# Patient Record
Sex: Male | Born: 1955
Health system: Southern US, Community
[De-identification: ages and names within clinical notes are randomized; demographics above are authoritative.]

## PROBLEM LIST (undated history)

## (undated) DIAGNOSIS — I10 Essential (primary) hypertension: Secondary | ICD-10-CM

## (undated) DIAGNOSIS — E119 Type 2 diabetes mellitus without complications: Secondary | ICD-10-CM

## (undated) HISTORY — DX: Essential (primary) hypertension: I10

## (undated) HISTORY — DX: Type 2 diabetes mellitus without complications: E11.9

## (undated) HISTORY — PX: TUMOR REMOVAL: SHX12

---

## 2013-02-26 DIAGNOSIS — E785 Hyperlipidemia, unspecified: Secondary | ICD-10-CM | POA: Insufficient documentation

## 2015-08-28 ENCOUNTER — Ambulatory Visit (INDEPENDENT_AMBULATORY_CARE_PROVIDER_SITE_OTHER): Payer: BLUE CROSS/BLUE SHIELD | Admitting: Family

## 2015-08-28 ENCOUNTER — Encounter: Payer: Self-pay | Admitting: Family

## 2015-08-28 ENCOUNTER — Other Ambulatory Visit (INDEPENDENT_AMBULATORY_CARE_PROVIDER_SITE_OTHER): Payer: BLUE CROSS/BLUE SHIELD

## 2015-08-28 VITALS — BP 134/82 | HR 90 | Temp 98.2°F | Resp 16 | Ht 63.0 in | Wt 144.0 lb

## 2015-08-28 DIAGNOSIS — Z125 Encounter for screening for malignant neoplasm of prostate: Secondary | ICD-10-CM | POA: Diagnosis not present

## 2015-08-28 DIAGNOSIS — Z0000 Encounter for general adult medical examination without abnormal findings: Secondary | ICD-10-CM

## 2015-08-28 DIAGNOSIS — I10 Essential (primary) hypertension: Secondary | ICD-10-CM | POA: Diagnosis not present

## 2015-08-28 DIAGNOSIS — E1140 Type 2 diabetes mellitus with diabetic neuropathy, unspecified: Secondary | ICD-10-CM

## 2015-08-28 DIAGNOSIS — Z1211 Encounter for screening for malignant neoplasm of colon: Secondary | ICD-10-CM

## 2015-08-28 DIAGNOSIS — E119 Type 2 diabetes mellitus without complications: Secondary | ICD-10-CM | POA: Insufficient documentation

## 2015-08-28 DIAGNOSIS — R7989 Other specified abnormal findings of blood chemistry: Secondary | ICD-10-CM | POA: Diagnosis not present

## 2015-08-28 LAB — MICROALBUMIN / CREATININE URINE RATIO
Creatinine,U: 84.5 mg/dL
MICROALB UR: 1.8 mg/dL (ref 0.0–1.9)
Microalb Creat Ratio: 2.1 mg/g (ref 0.0–30.0)

## 2015-08-28 LAB — COMPREHENSIVE METABOLIC PANEL
ALK PHOS: 79 U/L (ref 39–117)
ALT: 24 U/L (ref 0–53)
AST: 16 U/L (ref 0–37)
Albumin: 4.5 g/dL (ref 3.5–5.2)
BILIRUBIN TOTAL: 0.4 mg/dL (ref 0.2–1.2)
BUN: 22 mg/dL (ref 6–23)
CALCIUM: 9.8 mg/dL (ref 8.4–10.5)
CO2: 30 meq/L (ref 19–32)
Chloride: 98 mEq/L (ref 96–112)
Creatinine, Ser: 0.8 mg/dL (ref 0.40–1.50)
GFR: 104.66 mL/min (ref >60.00)
Glucose, Bld: 258 mg/dL — ABNORMAL HIGH (ref 70–99)
POTASSIUM: 4.6 meq/L (ref 3.5–5.1)
Sodium: 136 mEq/L (ref 135–145)
TOTAL PROTEIN: 7.6 g/dL (ref 6.0–8.3)

## 2015-08-28 LAB — HEMOGLOBIN A1C: Hgb A1c MFr Bld: 10.1 % — ABNORMAL HIGH (ref 4.6–6.5)

## 2015-08-28 LAB — CBC
HEMATOCRIT: 43 % (ref 39.0–52.0)
HEMOGLOBIN: 14.6 g/dL (ref 13.0–17.0)
MCHC: 33.8 g/dL (ref 30.0–36.0)
MCV: 88.7 fl (ref 78.0–100.0)
PLATELETS: 286 10*3/uL (ref 150.0–400.0)
RBC: 4.85 Mil/uL (ref 4.22–5.81)
RDW: 12.8 % (ref 11.5–15.5)
WBC: 8.9 10*3/uL (ref 4.0–10.5)

## 2015-08-28 LAB — PSA: PSA: 0.84 ng/mL (ref 0.10–4.00)

## 2015-08-28 LAB — LIPID PANEL
Cholesterol: 150 mg/dL (ref 0–200)
HDL: 40.4 mg/dL (ref >39.00)
NonHDL: 109.32
TRIGLYCERIDES: 230 mg/dL — AB (ref 0.0–149.0)
Total CHOL/HDL Ratio: 4
VLDL: 46 mg/dL — AB (ref 0.0–40.0)

## 2015-08-28 LAB — TSH: TSH: 0.51 u[IU]/mL (ref 0.35–4.50)

## 2015-08-28 LAB — HEPATITIS C ANTIBODY: HCV AB: NEGATIVE

## 2015-08-28 LAB — LDL CHOLESTEROL, DIRECT: LDL DIRECT: 51 mg/dL

## 2015-08-28 MED ORDER — METFORMIN HCL 1000 MG PO TABS: 1000.0000 mg | ORAL_TABLET | Freq: Two times a day (BID) | ORAL | Status: DC

## 2015-08-28 MED ORDER — LISINOPRIL 40 MG PO TABS: 40.0000 mg | ORAL_TABLET | Freq: Every day | ORAL | Status: DC

## 2015-08-28 NOTE — Progress Notes (Signed)
Subjective:    Patient ID: Edward [name], male    DOB: 1955/09/10, 60 y.o.   MRN: [id number]  Chief Complaint  Patient presents with  . Establish Care    CPE, had tea this morning but has not ate, needs A1c check, bydureon gives him a reaction    HPI:  Edward [name] is a 60 y.o. male who presents today for an annual wellness visit.   1) Health Maintenance -   Diet - Averages about 2 meals per day consisting of fruits, vegetables, chicken and beef, rice  Exercise - No structured exercise   2) Preventative Exams / Immunizations:  Dental -- Due for exam  Vision -- Due for exam   Health Maintenance  Topic Date Due  . HEMOGLOBIN A1C  Oct 09, 1955  . Hepatitis C Screening  Aug 05, 1955  . PNEUMOCOCCAL POLYSACCHARIDE VACCINE (1) 04/02/1957  . FOOT EXAM  04/02/1965  . OPHTHALMOLOGY EXAM  04/02/1965  . HIV Screening  04/02/1970  . COLONOSCOPY  04/02/2005  . ZOSTAVAX  04/03/2015  . INFLUENZA VACCINE  12/02/2015  . TETANUS/TDAP  10/08/2022    Immunization History  Administered Date(s) Administered  . Pneumococcal Polysaccharide-23 08/07/2012  . Tdap 08/07/2012     Review of Systems  Constitutional: Denies fever, chills, fatigue, or significant weight gain/loss. HENT: Head: Denies headache or neck pain Ears: Denies changes in hearing, ringing in ears, earache, drainage Nose: Denies discharge, stuffiness, itching, nosebleed, sinus pain Throat: Denies sore throat, hoarseness, dry mouth, sores, thrush Eyes: Denies loss/changes in vision, pain, redness, blurry/double vision, flashing lights Cardiovascular: Denies chest pain/discomfort, tightness, palpitations, shortness of breath with activity, difficulty lying down, swelling, sudden awakening with shortness of breath Respiratory: Denies shortness of breath, cough, sputum production, wheezing Gastrointestinal: Denies dysphasia, heartburn, change in appetite, nausea, change in bowel habits, rectal bleeding, constipation, diarrhea,  yellow skin or eyes Genitourinary: Denies frequency, urgency, burning/pain, blood in urine, incontinence, change in urinary strength. Musculoskeletal: Denies muscle/joint pain, stiffness, back pain, redness or swelling of joints, trauma Skin: Denies rashes, lumps, itching, dryness, color changes, or hair/nail changes Neurological: Denies dizziness, fainting, seizures, weakness, numbness, tingling, tremor Psychiatric - Denies nervousness, stress, depression or memory loss Endocrine: Denies heat or cold intolerance, sweating, frequent urination, excessive thirst, changes in appetite Hematologic: Denies ease of bruising or bleeding     Objective:     BP 134/82 mmHg  Pulse 90  Temp(Src) 98.2 F (36.8 C) (Oral)  Resp 16  Ht 5\' 3"  (1.6 m)  Wt 144 lb (65.318 kg)  BMI 25.51 kg/m2  SpO2 97% Nursing note and vital signs reviewed.  Physical Exam  Constitutional: He is oriented to person, place, and time. He appears well-developed and well-nourished.  HENT:  Head: Normocephalic.  Right Ear: Hearing, tympanic membrane, external ear and ear canal normal.  Left Ear: Hearing, tympanic membrane, external ear and ear canal normal.  Nose: Nose normal.  Mouth/Throat: Uvula is midline, oropharynx is clear and moist and mucous membranes are normal.  Eyes: Conjunctivae and EOM are normal. Pupils are equal, round, and reactive to light.  Neck: Neck supple. No JVD present. No tracheal deviation present. No thyromegaly present.  Cardiovascular: Normal rate, regular rhythm, normal heart sounds and intact distal pulses.   Pulmonary/Chest: Effort normal and breath sounds normal.  Abdominal: Soft. Bowel sounds are normal. He exhibits no distension and no mass. There is no tenderness. There is no rebound and no guarding.  Musculoskeletal: Normal range of motion. He exhibits no edema or  tenderness.  Lymphadenopathy:    He has no cervical adenopathy.  Neurological: He is alert and oriented to person, place,  and time. He has normal reflexes. No cranial nerve deficit. He exhibits normal muscle tone. Coordination normal.  Skin: Skin is warm and dry.  Psychiatric: He has a normal mood and affect. His behavior is normal. Judgment and thought content normal.       Assessment & Plan:   Problem List Items Addressed This Visit      Cardiovascular and Mediastinum   Essential hypertension    Hypertension is well controlled with current regimen and no adverse side effects. Continue current dosage of lisinopril.       Relevant Medications   lisinopril (PRINIVIL,ZESTRIL) 40 MG tablet     Endocrine   Type 2 diabetes mellitus ([place])    Type 2 diabetes currently managed on metformin and noted to be uncontrolled through previous lab work. Obtain A1c and urine microalbumin. Currently maintained on lisinopril for CAD risk reduction and hypertension. Diabetic foot exam completed today with some mild decreased sensation noted. Discontinued Bydureon secondary to skin site reaction. Sample of Trulicity given with instructions to check drug formulary for covered medications. Follow-up in 3 months or sooner pending A1c results.        Relevant Medications   lisinopril (PRINIVIL,ZESTRIL) 40 MG tablet   metFORMIN (GLUCOPHAGE) 1000 MG tablet   Other Relevant Orders   Hemoglobin A1c   Urine Microalbumin w/creat. ratio     Other   Routine general medical examination at a health care facility - Primary    1) Anticipatory Guidance: Discussed importance of wearing a seatbelt while driving and not texting while driving; changing batteries in smoke detector at least once annually; wearing suntan lotion when outside; eating a balanced and moderate diet; getting physical activity at least 30 minutes per day.  2) Immunizations / Screenings / Labs:  Declines Zostavax. All other immunizations are up-to-date per recommendations. Obtain PSA for prostate cancer screening. Obtain hepatitis C antibody for hepatitis C screening.  Obtain A1c for diabetes screening. Due for colonoscopy with referral to gastroenterology placed. Due for dental and eye exams encouraged to be completed independently. All other screenings are up-to-date per recommendations. Obtain CBC, CMET, Lipid profile and TSH.   Overall well exam with risk factors for cardiovascular disease including type 2 diabetes and hypertension. Current status of diabetes will be assessed with current A1c and currently maintained on metformin. Blood pressure appears well controlled with current regimen. Encouraged increased physical activity with recommended goal of 30 minutes of moderate level activity daily. This will also help with his blood sugar controls. Continue other healthy lifestyle choices and behaviors. Follow-up prevention exam in 1 year. Follow-up office visit for chronic conditions in 3 months.       Relevant Orders   Hepatitis C antibody   CBC   Comprehensive metabolic panel   Lipid panel   TSH   Hemoglobin A1c   Urine Microalbumin w/creat. ratio    Other Visit Diagnoses    Prostate cancer screening        Relevant Orders    PSA    Screening for colon cancer        Relevant Orders    Ambulatory referral to Gastroenterology

## 2015-08-28 NOTE — Assessment & Plan Note (Addendum)
1) Anticipatory Guidance: Discussed importance of wearing a seatbelt while driving and not texting while driving; changing batteries in smoke detector at least once annually; wearing suntan lotion when outside; eating a balanced and moderate diet; getting physical activity at least 30 minutes per day.  2) Immunizations / Screenings / Labs:  Declines Zostavax. All other immunizations are up-to-date per recommendations. Obtain PSA for prostate cancer screening. Obtain hepatitis C antibody for hepatitis C screening. Obtain A1c for diabetes screening. Due for colonoscopy with referral to gastroenterology placed. Due for dental and eye exams encouraged to be completed independently. All other screenings are up-to-date per recommendations. Obtain CBC, CMET, Lipid profile and TSH.   Overall well exam with risk factors for cardiovascular disease including type 2 diabetes and hypertension. Current status of diabetes will be assessed with current A1c and currently maintained on metformin. Blood pressure appears well controlled with current regimen. Encouraged increased physical activity with recommended goal of 30 minutes of moderate level activity daily. This will also help with his blood sugar controls. Continue other healthy lifestyle choices and behaviors. Follow-up prevention exam in 1 year. Follow-up office visit for chronic conditions in 3 months.

## 2015-08-28 NOTE — Assessment & Plan Note (Signed)
Hypertension is well controlled with current regimen and no adverse side effects. Continue current dosage of lisinopril.

## 2015-08-28 NOTE — Assessment & Plan Note (Signed)
Type 2 diabetes currently managed on metformin and noted to be uncontrolled through previous lab work. Obtain A1c and urine microalbumin. Currently maintained on lisinopril for CAD risk reduction and hypertension. Diabetic foot exam completed today with some mild decreased sensation noted. Discontinued Bydureon secondary to skin site reaction. Sample of Trulicity given with instructions to check drug formulary for covered medications. Follow-up in 3 months or sooner pending A1c results.

## 2015-08-28 NOTE — Patient Instructions (Signed)
Thank you for choosing [facility].  Summary/Instructions:  Please continue to take your medications as prescribed.  They will call to schedule your colonoscopy.  Please check your drug formulary for medications covered for diabetes.  Try the Trulicity 1x per week.   Your prescription(s) have been submitted to your pharmacy or been printed and provided for you. Please take as directed and contact our office if you believe you are having problem(s) with the medication(s) or have any questions.  Please stop by the lab on the basement level of the building for your blood work. Your results will be released to [place] (or called to you) after review, usually within 72 hours after test completion. If any changes need to be made, you will be notified at that same time.  Health Maintenance, Male A healthy lifestyle and preventative care can promote health and wellness.  Maintain regular health, dental, and eye exams.  Eat a healthy diet. Foods like vegetables, fruits, whole grains, low-fat dairy products, and lean protein foods contain the nutrients you need and are low in calories. Decrease your intake of foods high in solid fats, added sugars, and salt. Get information about a proper diet from your health care provider, if necessary.  Regular physical exercise is one of the most important things you can do for your health. Most adults should get at least 150 minutes of moderate-intensity exercise (any activity that increases your heart rate and causes you to sweat) each week. In addition, most adults need muscle-strengthening exercises on 2 or more days a week.   Maintain a healthy weight. The body mass index (BMI) is a screening tool to identify possible weight problems. It provides an estimate of body fat based on height and weight. Your health care provider can find your BMI and can help you achieve or maintain a healthy weight. For males 20 years and older:  A BMI below 18.5 is  considered underweight.  A BMI of 18.5 to 24.9 is normal.  A BMI of 25 to 29.9 is considered overweight.  A BMI of 30 and above is considered obese.  Maintain normal blood lipids and cholesterol by exercising and minimizing your intake of saturated fat. Eat a balanced diet with plenty of fruits and vegetables. Blood tests for lipids and cholesterol should begin at age 40 and be repeated every 5 years. If your lipid or cholesterol levels are high, you are over age 51, or you are at high risk for heart disease, you may need your cholesterol levels checked more frequently.Ongoing high lipid and cholesterol levels should be treated with medicines if diet and exercise are not working.  If you smoke, find out from your health care provider how to quit. If you do not use tobacco, do not start.  Lung cancer screening is recommended for adults aged 71-80 years who are at high risk for developing lung cancer because of a history of smoking. A yearly low-dose CT scan of the lungs is recommended for people who have at least a 30-pack-year history of smoking and are current smokers or have quit within the past 15 years. A pack year of smoking is smoking an average of 1 pack of cigarettes a day for 1 year (for example, a 30-pack-year history of smoking could mean smoking 1 pack a day for 30 years or 2 packs a day for 15 years). Yearly screening should continue until the smoker has stopped smoking for at least 15 years. Yearly screening should be stopped for people  who develop a health problem that would prevent them from having lung cancer treatment.  If you choose to drink alcohol, do not have more than 2 drinks per day. One drink is considered to be 12 oz (360 mL) of beer, 5 oz (150 mL) of wine, or 1.5 oz (45 mL) of liquor.  Avoid the use of street drugs. Do not share needles with anyone. Ask for help if you need support or instructions about stopping the use of drugs.  High blood pressure causes heart  disease and increases the risk of stroke. High blood pressure is more likely to develop in:  People who have blood pressure in the end of the normal range (100-139/85-89 mm Hg).  People who are overweight or obese.  People who are African American.  If you are 14-73 years of age, have your blood pressure checked every 3-5 years. If you are 60 years of age or older, have your blood pressure checked every year. You should have your blood pressure measured twice--once when you are at a hospital or clinic, and once when you are not at a hospital or clinic. Record the average of the two measurements. To check your blood pressure when you are not at a hospital or clinic, you can use:  An automated blood pressure machine at a pharmacy.  A home blood pressure monitor.  If you are 75-45 years old, ask your health care provider if you should take aspirin to prevent heart disease.  Diabetes screening involves taking a blood sample to check your fasting blood sugar level. This should be done once every 3 years after age 76 if you are at a normal weight and without risk factors for diabetes. Testing should be considered at a younger age or be carried out more frequently if you are overweight and have at least 1 risk factor for diabetes.  Colorectal cancer can be detected and often prevented. Most routine colorectal cancer screening begins at the age of 29 and continues through age 74. However, your health care provider may recommend screening at an earlier age if you have risk factors for colon cancer. On a yearly basis, your health care provider may provide home test kits to check for hidden blood in the stool. A small camera at the end of a tube may be used to directly examine the colon (sigmoidoscopy or colonoscopy) to detect the earliest forms of colorectal cancer. Talk to your health care provider about this at age 58 when routine screening begins. A direct exam of the colon should be repeated every 5-10  years through age 48, unless early forms of precancerous polyps or small growths are found.  People who are at an increased risk for hepatitis B should be screened for this virus. You are considered at high risk for hepatitis B if:  You were born in a country where hepatitis B occurs often. Talk with your health care provider about which countries are considered high risk.  Your parents were born in a high-risk country and you have not received a shot to protect against hepatitis B (hepatitis B vaccine).  You have HIV or AIDS.  You use needles to inject street drugs.  You live with, or have sex with, someone who has hepatitis B.  You are a man who has sex with other men (MSM).  You get hemodialysis treatment.  You take certain medicines for conditions like cancer, organ transplantation, and autoimmune conditions.  Hepatitis C blood testing is recommended for all people  born from [birth year] through 1965 and any individual with known risk factors for hepatitis C.  Healthy men should no longer receive prostate-specific antigen (PSA) blood tests as part of routine cancer screening. Talk to your health care provider about prostate cancer screening.  Testicular cancer screening is not recommended for adolescents or adult males who have no symptoms. Screening includes self-exam, a health care provider exam, and other screening tests. Consult with your health care provider about any symptoms you have or any concerns you have about testicular cancer.  Practice safe sex. Use condoms and avoid high-risk sexual practices to reduce the spread of sexually transmitted infections (STIs).  You should be screened for STIs, including gonorrhea and chlamydia if:  You are sexually active and are younger than 24 years.  You are older than 24 years, and your health care provider tells you that you are at risk for this type of infection.  Your sexual activity has changed since you were last screened, and you are  at an increased risk for chlamydia or gonorrhea. Ask your health care provider if you are at risk.  If you are at risk of being infected with HIV, it is recommended that you take a prescription medicine daily to prevent HIV infection. This is called pre-exposure prophylaxis (PrEP). You are considered at risk if:  You are a man who has sex with other men (MSM).  You are a heterosexual man who is sexually active with multiple partners.  You take drugs by injection.  You are sexually active with a partner who has HIV.  Talk with your health care provider about whether you are at high risk of being infected with HIV. If you choose to begin PrEP, you should first be tested for HIV. You should then be tested every 3 months for as long as you are taking PrEP.  Use sunscreen. Apply sunscreen liberally and repeatedly throughout the day. You should seek shade when your shadow is shorter than you. Protect yourself by wearing long sleeves, pants, a wide-brimmed hat, and sunglasses year round whenever you are outdoors.  Tell your health care provider of new moles or changes in moles, especially if there is a change in shape or color. Also, tell your health care provider if a mole is larger than the size of a pencil eraser.  A one-time screening for abdominal aortic aneurysm (AAA) and surgical repair of large AAAs by ultrasound is recommended for men aged 75-75 years who are current or former smokers.  Stay current with your vaccines (immunizations).   This information is not intended to replace advice given to you by your health care provider. Make sure you discuss any questions you have with your health care provider.   Document Released: 10/16/2007 Document Revised: 05/10/2014 Document Reviewed: 09/14/2010 Elsevier Interactive Patient Education [employer].

## 2015-08-31 ENCOUNTER — Telehealth: Payer: Self-pay | Admitting: Family

## 2015-08-31 DIAGNOSIS — E1140 Type 2 diabetes mellitus with diabetic neuropathy, unspecified: Secondary | ICD-10-CM

## 2015-08-31 MED ORDER — ROSUVASTATIN CALCIUM 10 MG PO TABS: 10.0000 mg | ORAL_TABLET | Freq: Every day | ORAL | Status: DC

## 2015-08-31 NOTE — Telephone Encounter (Signed)
Please inform patient that his blood work shows that his hepatitis C is negative. His kidney function, electrolytes, liver function, urine microalbumin, white/red blood cells, prostate and thyroid function are within the normal limits. His A1c as expected is elevated at 10.1 which means additional medications are needed. Please have him follow up with the drugs on his formulary so that we can work to get his blood sugar in control. As far as his cholesterol, it is recommend with his diabetes that we start him on a cholesterol medication. I will send the medication to his pharmacy.

## 2015-09-02 NOTE — Telephone Encounter (Signed)
Sending lab results in the due to language barrier.

## 2015-10-31 ENCOUNTER — Encounter: Payer: Self-pay | Admitting: Family

## 2015-11-11 ENCOUNTER — Telehealth: Payer: Self-pay | Admitting: *Deleted

## 2015-11-11 NOTE — Telephone Encounter (Signed)
Left msg on triage stating pt is trying to get an rx for Trulicity. Pt was given sample at last visit, but note doesn't say how mush he is suppose to take. Marya Amsler is not here pls advise on how much he need to take...Johny Chess

## 2015-11-12 MED ORDER — DULAGLUTIDE 0.75 MG/0.5ML ~~LOC~~ SOAJ: SUBCUTANEOUS | Status: DC

## 2015-11-12 NOTE — Telephone Encounter (Signed)
RX - done erx

## 2015-11-19 ENCOUNTER — Ambulatory Visit (INDEPENDENT_AMBULATORY_CARE_PROVIDER_SITE_OTHER): Payer: BLUE CROSS/BLUE SHIELD | Admitting: Family

## 2015-11-19 ENCOUNTER — Telehealth: Payer: Self-pay

## 2015-11-19 ENCOUNTER — Encounter: Payer: Self-pay | Admitting: Family

## 2015-11-19 VITALS — BP 118/82 | HR 76 | Temp 99.1°F | Resp 12 | Ht 65.0 in | Wt 144.0 lb

## 2015-11-19 DIAGNOSIS — E1140 Type 2 diabetes mellitus with diabetic neuropathy, unspecified: Secondary | ICD-10-CM

## 2015-11-19 DIAGNOSIS — I10 Essential (primary) hypertension: Secondary | ICD-10-CM | POA: Diagnosis not present

## 2015-11-19 LAB — POCT GLYCOSYLATED HEMOGLOBIN (HGB A1C): HEMOGLOBIN A1C: 9.9

## 2015-11-19 MED ORDER — GLUCOSE BLOOD VI STRP: ORAL_STRIP | Status: DC

## 2015-11-19 MED ORDER — DULAGLUTIDE 0.75 MG/0.5ML ~~LOC~~ SOAJ: SUBCUTANEOUS | Status: DC

## 2015-11-19 MED ORDER — DULAGLUTIDE 1.5 MG/0.5ML ~~LOC~~ SOAJ: 1.5000 mg | SUBCUTANEOUS | Status: DC

## 2015-11-19 MED ORDER — METFORMIN HCL 1000 MG PO TABS: 1000.0000 mg | ORAL_TABLET | Freq: Two times a day (BID) | ORAL | Status: DC

## 2015-11-19 MED ORDER — LISINOPRIL 40 MG PO TABS: 40.0000 mg | ORAL_TABLET | Freq: Every day | ORAL | Status: DC

## 2015-11-19 NOTE — Progress Notes (Signed)
Pre visit review using our clinic review tool, if applicable. No additional management support is needed unless otherwise documented below in the visit note. 

## 2015-11-19 NOTE — Patient Instructions (Addendum)
Thank you for choosing [facility].  Summary/Instructions:  Please continue to take your medication as prescribed.   Your prescription(s) have been submitted to your pharmacy or been printed and provided for you. Please take as directed and contact our office if you believe you are having problem(s) with the medication(s) or have any questions.  If your symptoms worsen or fail to improve, please contact our office for further instruction, or in case of emergency go directly to the emergency room at the closest medical facility.     

## 2015-11-19 NOTE — Telephone Encounter (Signed)
PA initiated and APPROVED through 05/02/2038 via [place]

## 2015-11-19 NOTE — Assessment & Plan Note (Signed)
Type 2 diabetes with improved A1c with continued elevation at 9.9. Increase Trulicity. Continue current dosage of metformin. Encouraged to follow low carbohydrate diet that is low in processed foods. Monitor blood sugars at home. Does have diabetic eye appointment scheduled. Maintained on lisinopril for CAD risk reduction. Follow up in 3 months.

## 2015-11-19 NOTE — Assessment & Plan Note (Signed)
Hypertension below goal of 140/90 with current regimen and no adverse side effects. No symptoms of end organ damage or worst headache of life. Continue current dosage of lisinopril. Monitor blood pressure at home and follow low sodium diet. Follow up in 3 months.

## 2015-11-19 NOTE — Progress Notes (Signed)
Subjective:    Patient ID: Edward [name], male    DOB: [birth year], 60 y.o.   MRN: [id number]  Chief Complaint  Patient presents with  . Follow-up    no new complaints    HPI:  Edward [name] is a 60 y.o. male who  has a past medical history of Hypertension and Diabetes mellitus without complication ([place]). and presents today for a follow up office visit.   1.) Type 2 diabetes - This is a chronic problem. Currently managed with Trulicity and metformin. Reports taking the medication as prescribed and denies adverse side effects or hypoglycemic readings. Has diabetic eye exam scheduled. No symptoms of end organ damage. Does not currently check his blood sugars at home as he has been out of test strips.    Lab Results  Component Value Date   HGBA1C 9.9 11/19/2015   2.) Hypertension - This is a chronic condition. Currently maintained on lisinopril. Reports taking medication as prescribed and denies adverse side effects or cough. Hypotensive readings. Denies worse headache of life. No new symptoms of end organ damage.  BP Readings from Last 3 Encounters:  11/19/15 118/82  08/28/15 134/82    No Known Allergies   Current Outpatient Prescriptions on File Prior to Visit  Medication Sig Dispense Refill  . Misc Natural Products (OSTEO BI-FLEX ADV DOUBLE ST PO) Take by mouth.    . Emollient (COLLAGEN EX) Apply topically. Reported on 11/19/2015     No current facility-administered medications on file prior to visit.    Review of Systems  Eyes:       Negative for changes in vision.  Respiratory: Negative for chest tightness and shortness of breath.   Cardiovascular: Negative for chest pain, palpitations and leg swelling.  Endocrine: Negative for polydipsia, polyphagia and polyuria.  Neurological: Negative for dizziness, weakness, light-headedness and headaches.      Objective:    BP 118/82 mmHg  Pulse 76  Temp(Src) 99.1 F (37.3 C) (Oral)  Resp 12  Ht 5\' 5"  (1.651 m)  Wt 144 lb  (65.318 kg)  BMI 23.96 kg/m2  SpO2 97% Nursing note and vital signs reviewed.  Physical Exam  Constitutional: He is oriented to person, place, and time. He appears well-developed and well-nourished. No distress.  Cardiovascular: Normal rate, regular rhythm, normal heart sounds and intact distal pulses.   Pulmonary/Chest: Effort normal and breath sounds normal.  Neurological: He is alert and oriented to person, place, and time.  Skin: Skin is warm and dry.  Psychiatric: He has a normal mood and affect. His behavior is normal. Judgment and thought content normal.       Assessment & Plan:   Problem List Items Addressed This Visit      Cardiovascular and Mediastinum   Essential hypertension    Hypertension below goal of 140/90 with current regimen and no adverse side effects. No symptoms of end organ damage or worst headache of life. Continue current dosage of lisinopril. Monitor blood pressure at home and follow low sodium diet. Follow up in 3 months.       Relevant Medications   lisinopril (PRINIVIL,ZESTRIL) 40 MG tablet     Endocrine   Type 2 diabetes mellitus ([place]) - Primary    Type 2 diabetes with improved A1c with continued elevation at 9.9. Increase Trulicity. Continue current dosage of metformin. Encouraged to follow low carbohydrate diet that is low in processed foods. Monitor blood sugars at home. Does have diabetic eye appointment scheduled. Maintained on  lisinopril for CAD risk reduction. Follow up in 3 months.       Relevant Medications   lisinopril (PRINIVIL,ZESTRIL) 40 MG tablet   metFORMIN (GLUCOPHAGE) 1000 MG tablet   Dulaglutide (TRULICITY) 1.5 0000000 SOPN   Other Relevant Orders   POCT HgB A1C (Completed)       I have discontinued Edward [name]'s rosuvastatin, Dulaglutide, and Dulaglutide. I am also having him start on glucose blood and Dulaglutide. Additionally, I am having him maintain his Misc Natural Products (OSTEO BI-FLEX ADV DOUBLE ST PO), Emollient  (COLLAGEN EX), lisinopril, and metFORMIN.   Meds ordered this encounter  Medications  . lisinopril (PRINIVIL,ZESTRIL) 40 MG tablet    Sig: Take 1 tablet (40 mg total) by mouth daily.    Dispense:  90 tablet    Refill:  3  . metFORMIN (GLUCOPHAGE) 1000 MG tablet    Sig: Take 1 tablet (1,000 mg total) by mouth 2 (two) times daily with a meal.    Dispense:  180 tablet    Refill:  3  . DISCONTD: Dulaglutide (TRULICITY) [id number] 0000000 SOPN    Sig: .5 cc weekly SQ    Dispense:  4 pen    Refill:  3  . glucose blood (ONE TOUCH ULTRA TEST) test strip    Sig: Use as instructed    Dispense:  100 each    Refill:  12    Substitution permissible per patient device and insurance coverage. Dx E11.9    Order Specific Question:  Supervising Provider    Answer:  Pricilla Holm A [id number]  . Dulaglutide (TRULICITY) 1.5 0000000 SOPN    Sig: Inject 1.5 mg into the skin once a week.    Dispense:  4 pen    Refill:  3    Please discontinue previous Trulicity order. Dx E11.9    Order Specific Question:  Supervising Provider    Answer:  Pricilla Holm A [id number]     Follow-up: Return in about 3 months (around 02/19/2016) for Diabetes.  Mauricio Po, FNP

## 2015-11-24 LAB — HM DIABETES EYE EXAM

## 2015-11-26 ENCOUNTER — Encounter: Payer: Self-pay | Admitting: Family

## 2016-02-18 ENCOUNTER — Encounter: Payer: Self-pay | Admitting: Family

## 2016-02-18 ENCOUNTER — Ambulatory Visit (INDEPENDENT_AMBULATORY_CARE_PROVIDER_SITE_OTHER): Payer: BLUE CROSS/BLUE SHIELD | Admitting: Family

## 2016-02-18 ENCOUNTER — Other Ambulatory Visit: Payer: Self-pay | Admitting: Family

## 2016-02-18 VITALS — BP 122/83 | HR 87 | Temp 98.1°F | Resp 14 | Ht 65.0 in | Wt 142.8 lb

## 2016-02-18 DIAGNOSIS — E1140 Type 2 diabetes mellitus with diabetic neuropathy, unspecified: Secondary | ICD-10-CM

## 2016-02-18 DIAGNOSIS — M25562 Pain in left knee: Secondary | ICD-10-CM

## 2016-02-18 LAB — POCT GLYCOSYLATED HEMOGLOBIN (HGB A1C): Hemoglobin A1C: 10.9

## 2016-02-18 MED ORDER — NAPROXEN-ESOMEPRAZOLE 500-20 MG PO TBEC: 1.0000 | DELAYED_RELEASE_TABLET | Freq: Two times a day (BID) | ORAL | 0 refills | Status: DC | PRN

## 2016-02-18 MED ORDER — GLIPIZIDE ER 5 MG PO TB24: 5.0000 mg | ORAL_TABLET | Freq: Every day | ORAL | 2 refills | Status: DC

## 2016-02-18 MED ORDER — DICLOFENAC SODIUM 2 % TD SOLN: 1.0000 "application " | Freq: Two times a day (BID) | TRANSDERMAL | 1 refills | Status: DC | PRN

## 2016-02-18 NOTE — Progress Notes (Signed)
Subjective:    Patient ID: Edward [name], male    DOB: [birth year], 60 y.o.   MRN: [id number]  Chief Complaint  Patient presents with  . Follow-up    diabetes, also pain in both knees x2 weeks    HPI:  Edward [name] is a 60 y.o. male who  has a past medical history of Diabetes mellitus without complication ([place]) and Hypertension. and presents today for a follow up office visit.   1.) Type 2 diabetes - Currently maintained on metformin and Trulicity. Reports taking the metformin on a regular basis and has not taken the Trulicity on a regular basis and denies adverse side effects. . Blood sugars at home are at best averaging around 260. Denies episodes of hypoglycemia or new symptoms of end organ damage.    Lab Results  Component Value Date   HGBA1C 10.9 02/18/2016    2.) Left Knee - This is a new problem. Associated symptom of pain located in his left knee has been going on for about 2 weeks. Denies any trauma or injury that he can recall. Initially notes it was difficult to walk on with some improvements. Pain was originally described as sharp but now is achy. Denies any sounds/sensations heard or felt.   No Known Allergies    Outpatient Medications Prior to Visit  Medication Sig Dispense Refill  . Emollient (COLLAGEN EX) Apply topically. Reported on 11/19/2015    . glucose blood (ONE TOUCH ULTRA TEST) test strip Use as instructed 100 each 12  . lisinopril (PRINIVIL,ZESTRIL) 40 MG tablet Take 1 tablet (40 mg total) by mouth daily. 90 tablet 3  . metFORMIN (GLUCOPHAGE) 1000 MG tablet Take 1 tablet (1,000 mg total) by mouth 2 (two) times daily with a meal. 180 tablet 3  . Misc Natural Products (OSTEO BI-FLEX ADV DOUBLE ST PO) Take by mouth.    . Dulaglutide (TRULICITY) 1.5 0000000 SOPN Inject 1.5 mg into the skin once a week. 4 pen 3   No facility-administered medications prior to visit.       Past Surgical History:  Procedure Laterality Date  . TUMOR REMOVAL        Past  Medical History:  Diagnosis Date  . Diabetes mellitus without complication ([name])   . Hypertension       Review of Systems  Eyes:       Negative for changes in vision.  Respiratory: Negative for chest tightness and shortness of breath.   Cardiovascular: Negative for chest pain, palpitations and leg swelling.  Endocrine: Negative for polydipsia, polyphagia and polyuria.  Neurological: Negative for dizziness, weakness, light-headedness and headaches.      Objective:    BP 122/83 (BP Location: Left Arm, Patient Position: Sitting, Cuff Size: Normal)   Pulse 87   Temp 98.1 F (36.7 C) (Oral)   Resp 14   Ht 5\' 5"  (1.651 m)   Wt 142 lb 12.8 oz (64.8 kg)   SpO2 98%   BMI 23.76 kg/m  Nursing note and vital signs reviewed.  Physical Exam  Constitutional: He is oriented to person, place, and time. He appears well-developed and well-nourished. No distress.  Cardiovascular: Normal rate, regular rhythm, normal heart sounds and intact distal pulses.   Pulmonary/Chest: Effort normal and breath sounds normal.  Musculoskeletal:  Left knee no obvious deformity, discoloration, or edema. Mild tenderness over medial femoral condyle/epicondyle. No pain elicited while in a seated position. There is discomfort noted with weightbearing. Range of motion within normal limits.  Strength is normal. Distal pulses and sensation are intact and appropriate. Ligamentous and meniscal testing are negative.   Neurological: He is alert and oriented to person, place, and time.  Skin: Skin is warm and dry.  Psychiatric: He has a normal mood and affect. His behavior is normal. Judgment and thought content normal.       Assessment & Plan:   Problem List Items Addressed This Visit      Endocrine   Type 2 diabetes mellitus ([name]) - Primary    Type 2 diabetes appears poorly controlled with A1c of 10.9 and less than compliant medication regimen with questionable patient understanding as a contributing factor.  Continue current dosage of metformin and Trulicity. Start Glipizide. Continue to monitor blood sugar at home. Follow-up in one month or sooner if blood sugars do not improve.      Relevant Medications   glipiZIDE (GLUCOTROL XL) 5 MG 24 hr tablet   Other Relevant Orders   POCT HgB A1C (Completed)     Other   Left knee pain    Left knee pain consistent with osteoarthritis/generalized inflammation. Treat conservatively with ice, home exercise therapy, and start Pennsaid and Vimovo. Compression sleeve as needed. Follow-up if symptoms worsen or do not improve.      Relevant Medications   Naproxen-Esomeprazole 500-20 MG TBEC   Diclofenac Sodium (PENNSAID) 2 % SOLN    Other Visit Diagnoses   None.      I am having Mr. [name] start on glipiZIDE, Naproxen-Esomeprazole, and Diclofenac Sodium. I am also having him maintain his Misc Natural Products (OSTEO BI-FLEX ADV DOUBLE ST PO), Emollient (COLLAGEN EX), lisinopril, metFORMIN, and glucose blood.   Meds ordered this encounter  Medications  . glipiZIDE (GLUCOTROL XL) 5 MG 24 hr tablet    Sig: Take 1 tablet (5 mg total) by mouth daily with breakfast.    Dispense:  30 tablet    Refill:  2    Order Specific Question:   Supervising Provider    Answer:   Pricilla Holm A [id number]  . Naproxen-Esomeprazole 500-20 MG TBEC    Sig: Take 1 tablet by mouth 2 (two) times daily as needed.    Dispense:  60 tablet    Refill:  0    Order Specific Question:   Supervising Provider    Answer:   Pricilla Holm A [id number]  . Diclofenac Sodium (PENNSAID) 2 % SOLN    Sig: Place 1 application onto the skin 2 (two) times daily as needed.    Dispense:  112 g    Refill:  1    Order Specific Question:   Supervising Provider    Answer:   Pricilla Holm A [id number]     Follow-up: Return in about 3 months (around 05/20/2016), or if symptoms worsen or fail to improve.  Mauricio Po, FNP

## 2016-02-18 NOTE — Assessment & Plan Note (Signed)
Left knee pain consistent with osteoarthritis/generalized inflammation. Treat conservatively with ice, home exercise therapy, and start Pennsaid and Vimovo. Compression sleeve as needed. Follow-up if symptoms worsen or do not improve.

## 2016-02-18 NOTE — Patient Instructions (Addendum)
Thank you for choosing [facility].  SUMMARY AND INSTRUCTIONS:  Ice x 20 minutes every 2 hours as needed and after activity.   Start Pennsaid 2x daily about 1/2 packet to the knee.   Medication:  Please continue to take the metformin twice daily and start taking the glipizide daily.   Continue to take the Trulicity weekly.  Monitor blood sugars 1-2 times per day.  Your prescription(s) have been submitted to your pharmacy or been printed and provided for you. Please take as directed and contact our office if you believe you are having problem(s) with the medication(s) or have any questions.  Follow up:  If your symptoms worsen or fail to improve, please contact our office for further instruction, or in case of emergency go directly to the emergency room at the closest medical facility.   DIABETES CARE INSTRUCTIONS:  Current A1c:  Lab Results  Component Value Date   HGBA1C 10.9 02/18/2016    A1C Goal: <7.0%  Fasting Blood Sugar Goal: 80-130 Blood sugar check that you take after fasting for at least 8 hours which is usually in the morning before eating.   Diabetes Prevention Screens:  1.) Ensure you have an annual eye exam by an ophthalmologist or optometrist.   2,) Ensure you have an annual foot exam or when any changes are noted including new onset numbness/tingling or wounds. Check your feet daily!  3.) The American Diabetes Association recommends the Pneumovax vaccination against pneumonia every 5 years.  4.) We will check your kidney function with a urine test on an annual basis.   Generic Knee Exercises EXERCISES RANGE OF MOTION (ROM) AND STRETCHING EXERCISES These exercises may help you when beginning to rehabilitate your injury. Your symptoms may resolve with or without further involvement from your physician, physical therapist, or athletic trainer. While completing these exercises, remember:   Restoring tissue flexibility helps normal motion to  return to the joints. This allows healthier, less painful movement and activity.  An effective stretch should be held for at least 30 seconds.  A stretch should never be painful. You should only feel a gentle lengthening or release in the stretched tissue. STRETCH - Knee Extension, Prone  Lie on your stomach on a firm surface, such as a bed or countertop. Place your right / left knee and leg just beyond the edge of the surface. You may wish to place a towel under the far end of your right / left thigh for comfort.  Relax your leg muscles and allow gravity to straighten your knee. Your clinician may advise you to add an ankle weight if more resistance is helpful for you.  You should feel a stretch in the back of your right / left knee. Hold this position for __________ seconds. Repeat __________ times. Complete this stretch __________ times per day. * Your physician, physical therapist, or athletic trainer may ask you to add ankle weight to enhance your stretch.  RANGE OF MOTION - Knee Flexion, Active  Lie on your back with both knees straight. (If this causes back discomfort, bend your opposite knee, placing your foot flat on the floor.)  Slowly slide your heel back toward your buttocks until you feel a gentle stretch in the front of your knee or thigh.  Hold for __________ seconds. Slowly slide your heel back to the starting position. Repeat __________ times. Complete this exercise __________ times per day.  STRETCH - Quadriceps, Prone   Lie on your stomach on a firm surface, such  as a bed or padded floor.  Bend your right / left knee and grasp your ankle. If you are unable to reach your ankle or pant leg, use a belt around your foot to lengthen your reach.  Gently pull your heel toward your buttocks. Your knee should not slide out to the side. You should feel a stretch in the front of your thigh and/or knee.  Hold this position for __________ seconds. Repeat __________ times.  Complete this stretch __________ times per day.  STRETCH - Hamstrings, Supine   Lie on your back. Loop a belt or towel over the ball of your right / left foot.  Straighten your right / left knee and slowly pull on the belt to raise your leg. Do not allow the right / left knee to bend. Keep your opposite leg flat on the floor.  Raise the leg until you feel a gentle stretch behind your right / left knee or thigh. Hold this position for __________ seconds. Repeat __________ times. Complete this stretch __________ times per day.  STRENGTHENING EXERCISES These exercises may help you when beginning to rehabilitate your injury. They may resolve your symptoms with or without further involvement from your physician, physical therapist, or athletic trainer. While completing these exercises, remember:   Muscles can gain both the endurance and the strength needed for everyday activities through controlled exercises.  Complete these exercises as instructed by your physician, physical therapist, or athletic trainer. Progress the resistance and repetitions only as guided.  You may experience muscle soreness or fatigue, but the pain or discomfort you are trying to eliminate should never worsen during these exercises. If this pain does worsen, stop and make certain you are following the directions exactly. If the pain is still present after adjustments, discontinue the exercise until you can discuss the trouble with your clinician. STRENGTH - Quadriceps, Isometrics  Lie on your back with your right / left leg extended and your opposite knee bent.  Gradually tense the muscles in the front of your right / left thigh. You should see either your knee cap slide up toward your hip or increased dimpling just above the knee. This motion will push the back of the knee down toward the floor/mat/bed on which you are lying.  Hold the muscle as tight as you can without increasing your pain for __________ seconds.  Relax  the muscles slowly and completely in between each repetition. Repeat __________ times. Complete this exercise __________ times per day.  STRENGTH - Quadriceps, Short Arcs   Lie on your back. Place a __________ inch towel roll under your knee so that the knee slightly bends.  Raise only your lower leg by tightening the muscles in the front of your thigh. Do not allow your thigh to rise.  Hold this position for __________ seconds. Repeat __________ times. Complete this exercise __________ times per day.  OPTIONAL ANKLE WEIGHTS: Begin with ____________________, but DO NOT exceed ____________________. Increase in 1 pound/0.5 kilogram increments.  STRENGTH - Quadriceps, Straight Leg Raises  Quality counts! Watch for signs that the quadriceps muscle is working to insure you are strengthening the correct muscles and not "cheating" by substituting with healthier muscles.  Lay on your back with your right / left leg extended and your opposite knee bent.  Tense the muscles in the front of your right / left thigh. You should see either your knee cap slide up or increased dimpling just above the knee. Your thigh may even quiver.  Tighten these  muscles even more and raise your leg 4 to 6 inches off the floor. Hold for __________ seconds.  Keeping these muscles tense, lower your leg.  Relax the muscles slowly and completely in between each repetition. Repeat __________ times. Complete this exercise __________ times per day.  STRENGTH - Hamstring, Curls  Lay on your stomach with your legs extended. (If you lay on a bed, your feet may hang over the edge.)  Tighten the muscles in the back of your thigh to bend your right / left knee up to 90 degrees. Keep your hips flat on the bed/floor.  Hold this position for __________ seconds.  Slowly lower your leg back to the starting position. Repeat __________ times. Complete this exercise __________ times per day.  OPTIONAL ANKLE WEIGHTS: Begin with  ____________________, but DO NOT exceed ____________________. Increase in 1 pound/0.5 kilogram increments.  STRENGTH - Quadriceps, Squats  Stand in a door frame so that your feet and knees are in line with the frame.  Use your hands for balance, not support, on the frame.  Slowly lower your weight, bending at the hips and knees. Keep your lower legs upright so that they are parallel with the door frame. Squat only within the range that does not increase your knee pain. Never let your hips drop below your knees.  Slowly return upright, pushing with your legs, not pulling with your hands. Repeat __________ times. Complete this exercise __________ times per day.  STRENGTH - Quadriceps, Wall Slides  Follow guidelines for form closely. Increased knee pain often results from poorly placed feet or knees.  Lean against a smooth wall or door and walk your feet out 18-24 inches. Place your feet hip-width apart.  Slowly slide down the wall or door until your knees bend __________ degrees.* Keep your knees over your heels, not your toes, and in line with your hips, not falling to either side.  Hold for __________ seconds. Stand up to rest for __________ seconds in between each repetition. Repeat __________ times. Complete this exercise __________ times per day. * Your physician, physical therapist, or athletic trainer will alter this angle based on your symptoms and progress.   This information is not intended to replace advice given to you by your health care provider. Make sure you discuss any questions you have with your health care provider.   Document Released: 03/03/2005 Document Revised: 05/10/2014 Document Reviewed: 08/01/2008 Elsevier Interactive Patient Education [employer].

## 2016-02-18 NOTE — Assessment & Plan Note (Signed)
Type 2 diabetes appears poorly controlled with A1c of 10.9 and less than compliant medication regimen with questionable patient understanding as a contributing factor. Continue current dosage of metformin and Trulicity. Start Glipizide. Continue to monitor blood sugar at home. Follow-up in one month or sooner if blood sugars do not improve.

## 2016-03-16 ENCOUNTER — Other Ambulatory Visit: Payer: Self-pay | Admitting: Family

## 2016-04-14 ENCOUNTER — Other Ambulatory Visit: Payer: Self-pay | Admitting: Family

## 2016-05-19 ENCOUNTER — Ambulatory Visit: Payer: BLUE CROSS/BLUE SHIELD | Admitting: Family

## 2016-05-25 ENCOUNTER — Other Ambulatory Visit: Payer: Self-pay | Admitting: Family

## 2016-05-25 ENCOUNTER — Encounter: Payer: Self-pay | Admitting: Family

## 2016-05-25 ENCOUNTER — Ambulatory Visit (INDEPENDENT_AMBULATORY_CARE_PROVIDER_SITE_OTHER): Payer: BLUE CROSS/BLUE SHIELD | Admitting: Family

## 2016-05-25 ENCOUNTER — Other Ambulatory Visit (INDEPENDENT_AMBULATORY_CARE_PROVIDER_SITE_OTHER): Payer: BLUE CROSS/BLUE SHIELD

## 2016-05-25 DIAGNOSIS — E1140 Type 2 diabetes mellitus with diabetic neuropathy, unspecified: Secondary | ICD-10-CM

## 2016-05-25 DIAGNOSIS — I10 Essential (primary) hypertension: Secondary | ICD-10-CM | POA: Diagnosis not present

## 2016-05-25 LAB — COMPREHENSIVE METABOLIC PANEL
ALT: 39 U/L (ref 0–53)
AST: 26 U/L (ref 0–37)
Albumin: 4.3 g/dL (ref 3.5–5.2)
Alkaline Phosphatase: 74 U/L (ref 39–117)
BILIRUBIN TOTAL: 0.4 mg/dL (ref 0.2–1.2)
BUN: 11 mg/dL (ref 6–23)
CHLORIDE: 101 meq/L (ref 96–112)
CO2: 30 meq/L (ref 19–32)
Calcium: 9.3 mg/dL (ref 8.4–10.5)
Creatinine, Ser: 0.65 mg/dL (ref 0.40–1.50)
GFR: 132.67 mL/min (ref >60.00)
GLUCOSE: 160 mg/dL — AB (ref 70–99)
Potassium: 3.7 mEq/L (ref 3.5–5.1)
Sodium: 138 mEq/L (ref 135–145)
Total Protein: 7.6 g/dL (ref 6.0–8.3)

## 2016-05-25 LAB — MICROALBUMIN / CREATININE URINE RATIO
CREATININE, U: 47 mg/dL
MICROALB/CREAT RATIO: 1.5 mg/g (ref 0.0–30.0)
Microalb, Ur: <0.7 mg/dL (ref 0.0–1.9)

## 2016-05-25 LAB — HEMOGLOBIN A1C: Hgb A1c MFr Bld: 9 % — ABNORMAL HIGH (ref 4.6–6.5)

## 2016-05-25 MED ORDER — LISINOPRIL 40 MG PO TABS: 40.0000 mg | ORAL_TABLET | Freq: Every day | ORAL | 3 refills | Status: DC

## 2016-05-25 MED ORDER — DULAGLUTIDE 1.5 MG/0.5ML ~~LOC~~ SOAJ: 1.5000 mg | SUBCUTANEOUS | 0 refills | Status: DC

## 2016-05-25 MED ORDER — DAPAGLIFLOZIN PRO-METFORMIN ER 5-1000 MG PO TB24: 1.0000 | ORAL_TABLET | Freq: Every day | ORAL | 2 refills | Status: DC

## 2016-05-25 MED ORDER — GLIPIZIDE ER 5 MG PO TB24: 5.0000 mg | ORAL_TABLET | Freq: Every day | ORAL | 2 refills | Status: DC

## 2016-05-25 NOTE — Progress Notes (Signed)
Subjective:    Patient ID: Edward [name], male    DOB: [birth year], 61 y.o.   MRN: [id number]  Chief Complaint  Patient presents with  . Follow-up    diabetes check, also is starting to experience some numbness in the tip of his fingers, happens right after waking up    HPI:  Edward [name] is a 61 y.o. male who  has a past medical history of Diabetes mellitus without complication ([place]) and Hypertension. and presents today for a follow up office visit.  1.) Type 2 diabetes - Currently mainted Trulicity, metformin and glipizide. Reports taking the medication as prescribed and denies adverse side effects of hypoglycemic readings. Blood sugars at home have been averaging around 200s. He has noted some numbness in the tips of his fingers and toes that occurs primarily in the morning. He is trying to decrease the carbohydrates in his diet. He has switched to brown rice from white rice.  Lab Results  Component Value Date   HGBA1C 10.9 02/18/2016     No Known Allergies    Outpatient Medications Prior to Visit  Medication Sig Dispense Refill  . Diclofenac Sodium (PENNSAID) 2 % SOLN Place 1 application onto the skin 2 (two) times daily as needed. 112 g 1  . Emollient (COLLAGEN EX) Apply topically. Reported on 11/19/2015    . glucose blood (ONE TOUCH ULTRA TEST) test strip Use as instructed 100 each 12  . metFORMIN (GLUCOPHAGE) 1000 MG tablet Take 1 tablet (1,000 mg total) by mouth 2 (two) times daily with a meal. 180 tablet 3  . Misc Natural Products (OSTEO BI-FLEX ADV DOUBLE ST PO) Take by mouth.    . Naproxen-Esomeprazole 500-20 MG TBEC Take 1 tablet by mouth 2 (two) times daily as needed. 60 tablet 0  . glipiZIDE (GLUCOTROL XL) 5 MG 24 hr tablet Take 1 tablet (5 mg total) by mouth daily with breakfast. 30 tablet 2  . lisinopril (PRINIVIL,ZESTRIL) 40 MG tablet Take 1 tablet (40 mg total) by mouth daily. 90 tablet 3  . TRULICITY 1.5 0000000 SOPN INJECT 1.5 MG INTO THE SKIN ONCE A WEEK 2 mL 0    No facility-administered medications prior to visit.      Review of Systems  Constitutional: Negative for chills and fever.  Eyes:       Negative for changes in vision.  Respiratory: Negative for cough, chest tightness, shortness of breath and wheezing.   Cardiovascular: Negative for chest pain, palpitations and leg swelling.  Endocrine: Negative for polydipsia, polyphagia and polyuria.  Neurological: Negative for dizziness, weakness, light-headedness and headaches.      Objective:    BP 132/84 (BP Location: Left Arm, Patient Position: Sitting, Cuff Size: Normal)   Pulse 84   Temp 99.1 F (37.3 C) (Oral)   Resp 16   Ht 5\' 5"  (1.651 m)   Wt 149 lb (67.6 kg)   SpO2 96%   BMI 24.79 kg/m  Nursing note and vital signs reviewed.  Physical Exam  Constitutional: He is oriented to person, place, and time. He appears well-developed and well-nourished. No distress.  Cardiovascular: Normal rate, regular rhythm, normal heart sounds and intact distal pulses.   Pulmonary/Chest: Effort normal and breath sounds normal.  Neurological: He is alert and oriented to person, place, and time.  Skin: Skin is warm and dry.  Psychiatric: He has a normal mood and affect. His behavior is normal. Judgment and thought content normal.       Assessment &  Plan:   Problem List Items Addressed This Visit      Cardiovascular and Mediastinum   Essential hypertension    Blood pressure appears adequate control and below goal 140/90 with current regimen and no adverse side effects. Denies worst headache of life with no symptoms of end organ damage from blood pressure noted on physical exam. Continue current dosage of lisinopril. Monitor blood pressure at home and follow sodium diet. Continue to monitor.      Relevant Medications   lisinopril (PRINIVIL,ZESTRIL) 40 MG tablet   Other Relevant Orders   Comprehensive metabolic panel     Endocrine   Type 2 diabetes mellitus (HCC)    Type 2 diabetes  appears uncontrolled with current medication regimen with home blood sugars of 210. No hypoglycemic readings. Obtain A1c, urine microalbumin, and complete metabolic profile. Does have some new onset numbness and tingling most likely related to elevated blood sugars. Continue current dosage of Trulicity, glipizide, and metformin. Consider additional medications pending A1c results. Diabetic foot exam, I exam, and Pneumovax are up-to-date per recommendations.      Relevant Medications   Dulaglutide (TRULICITY) 1.5 0000000 SOPN   glipiZIDE (GLUCOTROL XL) 5 MG 24 hr tablet   lisinopril (PRINIVIL,ZESTRIL) 40 MG tablet   Other Relevant Orders   Hemoglobin A1c   Urine Microalbumin w/creat. ratio   Comprehensive metabolic panel       I have changed Mr. Solem's TRULICITY to Dulaglutide. I am also having him maintain his Misc Natural Products (OSTEO BI-FLEX ADV DOUBLE ST PO), Emollient (COLLAGEN EX), metFORMIN, glucose blood, Naproxen-Esomeprazole, Diclofenac Sodium, glipiZIDE, and lisinopril.   Meds ordered this encounter  Medications  . Dulaglutide (TRULICITY) 1.5 0000000 SOPN    Sig: Inject 1.5 mg into the skin once a week.    Dispense:  2 mL    Refill:  0    Order Specific Question:   Supervising Provider    Answer:   Pricilla Holm A [id number]  . glipiZIDE (GLUCOTROL XL) 5 MG 24 hr tablet    Sig: Take 1 tablet (5 mg total) by mouth daily with breakfast.    Dispense:  30 tablet    Refill:  2    Order Specific Question:   Supervising Provider    Answer:   Pricilla Holm A [id number]  . lisinopril (PRINIVIL,ZESTRIL) 40 MG tablet    Sig: Take 1 tablet (40 mg total) by mouth daily.    Dispense:  90 tablet    Refill:  3    Order Specific Question:   Supervising Provider    Answer:   Pricilla Holm A [id number]     Follow-up: Return if symptoms worsen or fail to improve.  Mauricio Po, FNP

## 2016-05-25 NOTE — Assessment & Plan Note (Signed)
Type 2 diabetes appears uncontrolled with current medication regimen with home blood sugars of 210. No hypoglycemic readings. Obtain A1c, urine microalbumin, and complete metabolic profile. Does have some new onset numbness and tingling most likely related to elevated blood sugars. Continue current dosage of Trulicity, glipizide, and metformin. Consider additional medications pending A1c results. Diabetic foot exam, I exam, and Pneumovax are up-to-date per recommendations.

## 2016-05-25 NOTE — Patient Instructions (Signed)
Thank you for choosing [facility].  SUMMARY AND INSTRUCTIONS:  Medication:  Please continue to take her medications as prescribed.  Pending your A1c results we'll make changes as needed.  Please check your blood sugars daily.   Your prescription(s) have been submitted to your pharmacy or been printed and provided for you. Please take as directed and contact our office if you believe you are having problem(s) with the medication(s) or have any questions.  Labs:  Please stop by the lab on the lower level of the building for your blood work. Your results will be released to [place] (or called to you) after review, usually within 72 hours after test completion. If any changes need to be made, you will be notified at that same time.  1.) The lab is open from 7:30am to 5:30 pm Monday-Friday 2.) No appointment is necessary 3.) Fasting (if needed) is 6-8 hours after food and drink; black coffee and water are okay   Follow up:  If your symptoms worsen or fail to improve, please contact our office for further instruction, or in case of emergency go directly to the emergency room at the closest medical facility.   DIABETES CARE INSTRUCTIONS:  Current A1c:  Lab Results  Component Value Date   HGBA1C 10.9 02/18/2016    A1C Goal: <7.0%  Fasting Blood Sugar Goal: 80-130 Blood sugar check that you take after fasting for at least 8 hours which is usually in the morning before eating.  Post-prandial Blood Sugar Goal: <180 Blood sugar check approximately 1-2 hours after the start of a meal.   Diabetes Prevention Screens:  1.) Ensure you have an annual eye exam by an ophthalmologist or optometrist.   2,) Ensure you have an annual foot exam or when any changes are noted including new onset numbness/tingling or wounds. Check your feet daily!  3.) The American Diabetes Association recommends the Pneumovax vaccination against pneumonia every 5 years.  4.) We will check your kidney  function with a urine test on an annual basis.

## 2016-05-25 NOTE — Assessment & Plan Note (Signed)
Blood pressure appears adequate control and below goal 140/90 with current regimen and no adverse side effects. Denies worst headache of life with no symptoms of end organ damage from blood pressure noted on physical exam. Continue current dosage of lisinopril. Monitor blood pressure at home and follow sodium diet. Continue to monitor.

## 2016-05-31 ENCOUNTER — Telehealth: Payer: Self-pay

## 2016-05-31 NOTE — Telephone Encounter (Signed)
PA initiated for xigduo. Waiting response

## 2016-06-17 ENCOUNTER — Telehealth: Payer: Self-pay

## 2016-06-17 NOTE — Telephone Encounter (Signed)
PA done for xigduo on covermymeds.  Key is [id number]

## 2016-06-18 ENCOUNTER — Other Ambulatory Visit: Payer: Self-pay | Admitting: Family

## 2016-06-18 DIAGNOSIS — M25562 Pain in left knee: Secondary | ICD-10-CM

## 2016-06-21 ENCOUNTER — Encounter: Payer: Self-pay | Admitting: Family

## 2016-06-23 ENCOUNTER — Telehealth: Payer: Self-pay

## 2016-06-23 MED ORDER — CANAGLIFLOZIN-METFORMIN HCL ER 50-1000 MG PO TB24: 1.0000 | ORAL_TABLET | Freq: Every day | ORAL | 2 refills | Status: DC

## 2016-06-23 NOTE — Telephone Encounter (Signed)
PA for Merleen Nicely is denied. Alternatives are Invokamet and Synjardy. Please advise

## 2016-06-23 NOTE — Telephone Encounter (Signed)
Xigduo discontinued and Invokamet sent to pharmacy.

## 2016-06-23 NOTE — Telephone Encounter (Signed)
Informed pt through mychart.

## 2016-06-23 NOTE — Addendum Note (Signed)
Addended by: Mauricio Po D on: 06/23/2016 03:29 PM   Modules accepted: Orders

## 2016-07-02 ENCOUNTER — Other Ambulatory Visit: Payer: Self-pay | Admitting: Family

## 2016-08-17 ENCOUNTER — Other Ambulatory Visit: Payer: Self-pay | Admitting: Family

## 2016-08-17 DIAGNOSIS — M25562 Pain in left knee: Secondary | ICD-10-CM

## 2016-08-24 ENCOUNTER — Ambulatory Visit (INDEPENDENT_AMBULATORY_CARE_PROVIDER_SITE_OTHER): Payer: BLUE CROSS/BLUE SHIELD | Admitting: Family

## 2016-08-24 VITALS — BP 118/70 | HR 95 | Temp 98.3°F | Ht 65.0 in | Wt 142.0 lb

## 2016-08-24 DIAGNOSIS — M25562 Pain in left knee: Secondary | ICD-10-CM | POA: Diagnosis not present

## 2016-08-24 DIAGNOSIS — E1140 Type 2 diabetes mellitus with diabetic neuropathy, unspecified: Secondary | ICD-10-CM

## 2016-08-24 LAB — POCT GLYCOSYLATED HEMOGLOBIN (HGB A1C): HEMOGLOBIN A1C: 8.7

## 2016-08-24 MED ORDER — DULAGLUTIDE 1.5 MG/0.5ML ~~LOC~~ SOAJ: 1.5000 mg | SUBCUTANEOUS | 2 refills | Status: DC

## 2016-08-24 MED ORDER — CANAGLIFLOZIN-METFORMIN HCL ER 150-1000 MG PO TB24: 1.0000 | ORAL_TABLET | Freq: Every day | ORAL | 2 refills | Status: DC

## 2016-08-24 MED ORDER — NAPROXEN-ESOMEPRAZOLE 500-20 MG PO TBEC: 1.0000 | DELAYED_RELEASE_TABLET | Freq: Two times a day (BID) | ORAL | 2 refills | Status: DC | PRN

## 2016-08-24 MED ORDER — GLIPIZIDE ER 5 MG PO TB24: 5.0000 mg | ORAL_TABLET | Freq: Every day | ORAL | 2 refills | Status: DC

## 2016-08-24 NOTE — Patient Instructions (Signed)
Thank you for choosing [facility].  SUMMARY AND INSTRUCTIONS:  We have increased your Invokamet to [phone].   Please continue to take other medications as prescribed.  Continue to work on decreasing carbohydrates.   Follow up in 3 months.   Medication:  Your prescription(s) have been submitted to your pharmacy or been printed and provided for you. Please take as directed and contact our office if you believe you are having problem(s) with the medication(s) or have any questions.   Follow up:  If your symptoms worsen or fail to improve, please contact our office for further instruction, or in case of emergency go directly to the emergency room at the closest medical facility.

## 2016-08-24 NOTE — Assessment & Plan Note (Signed)
Type 2 diabetes remains uncontrolled with an office A1c of 8.7 which is improved from 9.0. Increase Invokamet. Continue current dosage of glipizide and Trulicity. Monitor blood sugars at home. Continue to work on low carb/modified carb nutritional intake. Diabetic foot exam completed today. Urine micro is up to date. Continue to monitor.

## 2016-08-24 NOTE — Progress Notes (Signed)
Pre visit review using our clinic review tool, if applicable. No additional management support is needed unless otherwise documented below in the visit note. 

## 2016-08-24 NOTE — Progress Notes (Signed)
Subjective:    Patient ID: Edward [name], male    DOB: 19-Sep-1955, 61 y.o.   MRN: [id number]  Chief Complaint  Patient presents with  . Follow-up    diabetes     HPI:  Edward [name] is a 61 y.o. male who  has a past medical history of Diabetes mellitus without complication ([place]) and Hypertension. and presents today for a follow up office visit.  Reports taking the medication as prescribed and denies adverse side effects or hypoglycemic readings. Blood sugars at home have been averaging around 210 in the morning. . Denies new symptoms of end organ damage. Does endorse some increased thirst. Working on a low/carbohydrate modified oral intake.   Lab Results  Component Value Date   HGBA1C 8.7 08/24/2016     Lab Results  Component Value Date   CREATININE 0.65 05/25/2016   BUN 11 05/25/2016   NA 138 05/25/2016   K 3.7 05/25/2016   CL 101 05/25/2016   CO2 30 05/25/2016       No Known Allergies    Outpatient Medications Prior to Visit  Medication Sig Dispense Refill  . Emollient (COLLAGEN EX) Apply topically. Reported on 11/19/2015    . glucose blood (ONE TOUCH ULTRA TEST) test strip Use as instructed 100 each 12  . lisinopril (PRINIVIL,ZESTRIL) 40 MG tablet Take 1 tablet (40 mg total) by mouth daily. 90 tablet 3  . Misc Natural Products (OSTEO BI-FLEX ADV DOUBLE ST PO) Take by mouth.    Marland Kitchen PENNSAID 2 % SOLN Place 1 application onto the skin 2 (two) times daily as needed. 112 g 0  . Canagliflozin-Metformin HCl ER (INVOKAMET XR) 50-1000 MG TB24 Take 1 tablet by mouth daily. 30 tablet 2  . glipiZIDE (GLUCOTROL XL) 5 MG 24 hr tablet Take 1 tablet (5 mg total) by mouth daily with breakfast. 30 tablet 2  . TRULICITY 1.5 VQ/0.0QQ SOPN INJECT 1.5 MG INTO THE SKIN ONCE A WEEK 2 mL 0  . VIMOVO 500-20 MG TBEC Take 1 tablet by mouth 2 (two) times daily as needed. 60 tablet 0   No facility-administered medications prior to visit.      Review of Systems  Eyes:       Negative for changes  in vision.  Respiratory: Negative for chest tightness and shortness of breath.   Cardiovascular: Negative for chest pain, palpitations and leg swelling.  Endocrine: Negative for polydipsia, polyphagia and polyuria.  Neurological: Negative for dizziness, weakness, light-headedness and headaches.      Objective:    BP 118/70 (BP Location: Left Arm, Patient Position: Sitting, Cuff Size: Normal)   Pulse 95   Temp 98.3 F (36.8 C) (Oral)   Ht 5\' 5"  (1.651 m)   Wt 142 lb (64.4 kg)   SpO2 98%   BMI 23.63 kg/m  Nursing note and vital signs reviewed.  Physical Exam  Constitutional: He is oriented to person, place, and time. He appears well-developed and well-nourished. No distress.  Cardiovascular: Normal rate, regular rhythm, normal heart sounds and intact distal pulses.   Pulmonary/Chest: Effort normal and breath sounds normal.  Neurological: He is alert and oriented to person, place, and time.  Diabetic Foot Exam - Simple   Simple Foot Form Diabetic Foot exam was performed with the following findings:  Yes  08/24/2016 12:47 PM  Visual Inspection No deformities, no ulcerations, no other skin breakdown bilaterally:  Yes Sensation Testing Intact to touch and monofilament testing bilaterally:  Yes Pulse Check Posterior  Tibialis and Dorsalis pulse intact bilaterally:  Yes Comments  Skin: Skin is warm and dry.  Psychiatric: He has a normal mood and affect. His behavior is normal. Judgment and thought content normal.       Assessment & Plan:   Problem List Items Addressed This Visit      Endocrine   Type 2 diabetes mellitus ([place]) - Primary    Type 2 diabetes remains uncontrolled with an office A1c of 8.7 which is improved from 9.0. Increase Invokamet. Continue current dosage of glipizide and Trulicity. Monitor blood sugars at home. Continue to work on low carb/modified carb nutritional intake. Diabetic foot exam completed today. Urine micro is up to date. Continue to monitor.         Relevant Medications   Dulaglutide (TRULICITY) 1.5 GY/1.7CB SOPN   glipiZIDE (GLUCOTROL XL) 5 MG 24 hr tablet   Canagliflozin-Metformin HCl ER (INVOKAMET XR) [phone] MG TB24   Other Relevant Orders   POCT HgB A1C (Completed)     Other   Left knee pain   Relevant Medications   Naproxen-Esomeprazole (VIMOVO) 500-20 MG TBEC       I have discontinued Mr. Vanhoose's Canagliflozin-Metformin HCl ER. I have also changed his TRULICITY to Dulaglutide and VIMOVO to Naproxen-Esomeprazole. Additionally, I am having him start on Canagliflozin-Metformin HCl ER. Lastly, I am having him maintain his Misc Natural Products (OSTEO BI-FLEX ADV DOUBLE ST PO), Emollient (COLLAGEN EX), glucose blood, lisinopril, PENNSAID, and glipiZIDE.   Meds ordered this encounter  Medications  . Dulaglutide (TRULICITY) 1.5 SW/9.6PR SOPN    Sig: Inject 1.5 mg into the skin once a week.    Dispense:  2 mL    Refill:  2    Order Specific Question:   Supervising Provider    Answer:   Pricilla Holm A [[id number]]  . Naproxen-Esomeprazole (VIMOVO) 500-20 MG TBEC    Sig: Take 1 tablet by mouth 2 (two) times daily as needed.    Dispense:  60 tablet    Refill:  2    Order Specific Question:   Supervising Provider    Answer:   Pricilla Holm A [[id number]]  . glipiZIDE (GLUCOTROL XL) 5 MG 24 hr tablet    Sig: Take 1 tablet (5 mg total) by mouth daily with breakfast.    Dispense:  30 tablet    Refill:  2    Order Specific Question:   Supervising Provider    Answer:   Pricilla Holm A [[id number]]  . Canagliflozin-Metformin HCl ER (INVOKAMET XR) [phone] MG TB24    Sig: Take 1 tablet by mouth daily.    Dispense:  30 tablet    Refill:  2    Order Specific Question:   Supervising Provider    Answer:   Pricilla Holm A [[id number]]     Follow-up: Return in about 3 months (around 11/23/2016).  Mauricio Po, FNP

## 2016-10-27 ENCOUNTER — Other Ambulatory Visit: Payer: Self-pay | Admitting: Family

## 2016-11-09 ENCOUNTER — Other Ambulatory Visit: Payer: Self-pay | Admitting: Family

## 2016-11-09 DIAGNOSIS — E1140 Type 2 diabetes mellitus with diabetic neuropathy, unspecified: Secondary | ICD-10-CM

## 2016-11-17 ENCOUNTER — Other Ambulatory Visit: Payer: Self-pay | Admitting: Family

## 2016-11-22 ENCOUNTER — Encounter: Payer: Self-pay | Admitting: Family

## 2016-11-22 ENCOUNTER — Ambulatory Visit (INDEPENDENT_AMBULATORY_CARE_PROVIDER_SITE_OTHER): Payer: BLUE CROSS/BLUE SHIELD | Admitting: Family

## 2016-11-22 VITALS — BP 124/72 | HR 89 | Temp 98.3°F | Resp 16 | Ht 65.0 in | Wt 143.8 lb

## 2016-11-22 DIAGNOSIS — M25562 Pain in left knee: Secondary | ICD-10-CM | POA: Diagnosis not present

## 2016-11-22 DIAGNOSIS — E1140 Type 2 diabetes mellitus with diabetic neuropathy, unspecified: Secondary | ICD-10-CM

## 2016-11-22 LAB — POCT GLYCOSYLATED HEMOGLOBIN (HGB A1C): Hemoglobin A1C: 6.3

## 2016-11-22 MED ORDER — NAPROXEN-ESOMEPRAZOLE 500-20 MG PO TBEC: 1.0000 | DELAYED_RELEASE_TABLET | Freq: Two times a day (BID) | ORAL | 2 refills | Status: DC | PRN

## 2016-11-22 MED ORDER — DULAGLUTIDE 1.5 MG/0.5ML ~~LOC~~ SOAJ: 1.5000 mg | SUBCUTANEOUS | 5 refills | Status: DC

## 2016-11-22 MED ORDER — GLIPIZIDE ER 5 MG PO TB24: 5.0000 mg | ORAL_TABLET | Freq: Every day | ORAL | 1 refills | Status: DC

## 2016-11-22 NOTE — Assessment & Plan Note (Signed)
In office A1c of 6.3 significantly improved from 8.2 with change of medication. No adverse side effects. Continue current dosage of Trulicity, Invokmet, and glipizide. Diabetic prevention up to date and maintained on lisinopril for CAD risk reduction. Encouraged to monitor blood sugars at home once daily. Follow up in 6 months or sooner if needed.

## 2016-11-22 NOTE — Patient Instructions (Addendum)
Thank you for choosing [facility].  SUMMARY AND INSTRUCTIONS:  Your A1c is EXCELLET at 6.3%!  Please continue to take your medications as prescribed.  Schedule a time for your diabetic eye exam at your convenience if not already completed.   Monitor your blood sugars at home 1-2 times per day.   Follow up in 6 months or sooner if needed.   Medication:  Your prescription(s) have been submitted to your pharmacy or been printed and provided for you. Please take as directed and contact our office if you believe you are having problem(s) with the medication(s) or have any questions.  Follow up:  If your symptoms worsen or fail to improve, please contact our office for further instruction, or in case of emergency go directly to the emergency room at the closest medical facility.

## 2016-11-22 NOTE — Progress Notes (Signed)
Subjective:    Patient ID: Edward [name], male    DOB: 08-21-1955, 61 y.o.   MRN: [id number]  Chief Complaint  Patient presents with  . Follow-up    diabetes    HPI:  Edward [name] is a 61 y.o. male who  has a past medical history of Diabetes mellitus without complication ([place]) and Hypertension. and presents today for a follow up office visit.    1.) Type 2 diabetes - Currently maintained on Trulicity, glipizide and Invokamet.  Reports taking the medication as prescribed and denies adverse side effects or hypoglycemic readings. Not currently check blood sugars at home. Denies new symptoms of end organ damage. No excessive hunger, thirst, or urination. Working on a low/carbohydrate modified oral intake.   Lab Results  Component Value Date   HGBA1C 6.3 11/22/2016     Lab Results  Component Value Date   CREATININE 0.65 05/25/2016   BUN 11 05/25/2016   NA 138 05/25/2016   K 3.7 05/25/2016   CL 101 05/25/2016   CO2 30 05/25/2016    No Known Allergies    Outpatient Medications Prior to Visit  Medication Sig Dispense Refill  . Emollient (COLLAGEN EX) Apply topically. Reported on 11/19/2015    . glucose blood (ONE TOUCH ULTRA TEST) test strip Use as instructed 100 each 12  . INVOKAMET XR [phone] MG TB24 TAKE 1 TABLET BY MOUTH DAILY 30 tablet 0  . lisinopril (PRINIVIL,ZESTRIL) 40 MG tablet Take 1 tablet (40 mg total) by mouth daily. 90 tablet 3  . Misc Natural Products (OSTEO BI-FLEX ADV DOUBLE ST [name]) Take by mouth.    Marland Kitchen PENNSAID 2 % SOLN Place 1 application onto the skin 2 (two) times daily as needed. 112 g 0  . glipiZIDE (GLUCOTROL XL) 5 MG 24 hr tablet Take 1 tablet (5 mg total) by mouth daily with breakfast. Annual appt is due must see MD for future refills 30 tablet 0  . Naproxen-Esomeprazole (VIMOVO) 500-20 MG TBEC Take 1 tablet by mouth 2 (two) times daily as needed. 60 tablet 2  . TRULICITY 1.5 LE/7.5TZ SOPN INJECT 1.5 MG INTO THE SKIN ONCE A WEEK 2 mL 2   No  facility-administered medications prior to visit.      Review of Systems  Eyes:       Negative for changes in vision.  Respiratory: Negative for chest tightness and shortness of breath.   Cardiovascular: Negative for chest pain, palpitations and leg swelling.  Endocrine: Negative for polydipsia, polyphagia and polyuria.  Neurological: Negative for dizziness, weakness, light-headedness and headaches.      Objective:    BP 124/72 (BP Location: Left Arm, Patient Position: Sitting, Cuff Size: Normal)   Pulse 89   Temp 98.3 F (36.8 C) (Oral)   Resp 16   Ht 5\' 5"  (1.651 m)   Wt 143 lb 12.8 oz (65.2 kg)   SpO2 97%   BMI 23.93 kg/m  Nursing note and vital signs reviewed.  Physical Exam  Constitutional: He is oriented to person, place, and time. He appears well-developed and well-nourished. No distress.  Cardiovascular: Normal rate, regular rhythm, normal heart sounds and intact distal pulses.   Pulmonary/Chest: Effort normal and breath sounds normal.  Neurological: He is alert and oriented to person, place, and time.  Skin: Skin is warm and dry.  Psychiatric: He has a normal mood and affect. His behavior is normal. Judgment and thought content normal.       Assessment & Plan:  Problem List Items Addressed This Visit      Endocrine   Type 2 diabetes mellitus ([place])    In office A1c of 6.3 significantly improved from 8.2 with change of medication. No adverse side effects. Continue current dosage of Trulicity, Invokmet, and glipizide. Diabetic prevention up to date and maintained on lisinopril for CAD risk reduction. Encouraged to monitor blood sugars at home once daily. Follow up in 6 months or sooner if needed.       Relevant Medications   Dulaglutide (TRULICITY) 1.5 LK/5.6YB SOPN   glipiZIDE (GLUCOTROL XL) 5 MG 24 hr tablet   Other Relevant Orders   POCT HgB A1C (Completed)     Other   Left knee pain   Relevant Medications   Naproxen-Esomeprazole (VIMOVO) 500-20 MG  TBEC       I have changed [name]'s TRULICITY to Dulaglutide. I have also changed his glipiZIDE. I am also having him maintain his Misc Natural Products (OSTEO BI-FLEX ADV DOUBLE ST [name]), Emollient (COLLAGEN EX), glucose blood, lisinopril, PENNSAID, INVOKAMET XR, and Naproxen-Esomeprazole.   Meds ordered this encounter  Medications  . Dulaglutide (TRULICITY) 1.5 WL/8.9HT SOPN    Sig: Inject 1.5 mg into the skin once a week.    Dispense:  2 mL    Refill:  5  . glipiZIDE (GLUCOTROL XL) 5 MG 24 hr tablet    Sig: Take 1 tablet (5 mg total) by mouth daily with breakfast.    Dispense:  90 tablet    Refill:  1  . Naproxen-Esomeprazole (VIMOVO) 500-20 MG TBEC    Sig: Take 1 tablet by mouth 2 (two) times daily as needed.    Dispense:  60 tablet    Refill:  2    Order Specific Question:   Supervising Provider    Answer:   Pricilla Holm A [[id number]]     Follow-up: Return in about 6 months (around 05/25/2017), or if symptoms worsen or fail to improve.  Edward Po, FNP

## 2016-11-23 ENCOUNTER — Ambulatory Visit: Payer: BLUE CROSS/BLUE SHIELD | Admitting: Family

## 2016-11-25 LAB — HM DIABETES EYE EXAM

## 2016-11-30 ENCOUNTER — Encounter: Payer: Self-pay | Admitting: Family

## 2016-11-30 NOTE — Progress Notes (Unsigned)
Results entered and sent to scan  

## 2016-12-13 ENCOUNTER — Other Ambulatory Visit: Payer: Self-pay | Admitting: Family

## 2016-12-17 ENCOUNTER — Other Ambulatory Visit: Payer: Self-pay | Admitting: Family

## 2016-12-17 DIAGNOSIS — E1140 Type 2 diabetes mellitus with diabetic neuropathy, unspecified: Secondary | ICD-10-CM

## 2017-05-18 ENCOUNTER — Other Ambulatory Visit: Payer: Self-pay

## 2017-05-18 MED ORDER — CANAGLIFLOZIN-METFORMIN HCL ER 150-1000 MG PO TB24: 1.0000 | ORAL_TABLET | Freq: Every day | ORAL | 0 refills | Status: DC

## 2017-05-24 ENCOUNTER — Other Ambulatory Visit (INDEPENDENT_AMBULATORY_CARE_PROVIDER_SITE_OTHER): Payer: BLUE CROSS/BLUE SHIELD

## 2017-05-24 ENCOUNTER — Encounter: Payer: Self-pay | Admitting: Nurse Practitioner

## 2017-05-24 ENCOUNTER — Ambulatory Visit: Payer: BLUE CROSS/BLUE SHIELD | Admitting: Family

## 2017-05-24 ENCOUNTER — Ambulatory Visit: Payer: BLUE CROSS/BLUE SHIELD | Admitting: Nurse Practitioner

## 2017-05-24 VITALS — BP 156/90 | HR 79 | Temp 98.1°F | Resp 16 | Ht 65.0 in | Wt 142.0 lb

## 2017-05-24 DIAGNOSIS — E1140 Type 2 diabetes mellitus with diabetic neuropathy, unspecified: Secondary | ICD-10-CM | POA: Diagnosis not present

## 2017-05-24 DIAGNOSIS — I10 Essential (primary) hypertension: Secondary | ICD-10-CM | POA: Diagnosis not present

## 2017-05-24 DIAGNOSIS — M25511 Pain in right shoulder: Secondary | ICD-10-CM | POA: Diagnosis not present

## 2017-05-24 LAB — BASIC METABOLIC PANEL
BUN: 19 mg/dL (ref 6–23)
CHLORIDE: 102 meq/L (ref 96–112)
CO2: 31 meq/L (ref 19–32)
Calcium: 9.2 mg/dL (ref 8.4–10.5)
Creatinine, Ser: 0.63 mg/dL (ref 0.40–1.50)
GFR: 137.09 mL/min (ref >60.00)
GLUCOSE: 105 mg/dL — AB (ref 70–99)
POTASSIUM: 4 meq/L (ref 3.5–5.1)
SODIUM: 139 meq/L (ref 135–145)

## 2017-05-24 LAB — HEMOGLOBIN A1C: Hgb A1c MFr Bld: 6.9 % — ABNORMAL HIGH (ref 4.6–6.5)

## 2017-05-24 MED ORDER — AMLODIPINE BESYLATE 5 MG PO TABS: 5.0000 mg | ORAL_TABLET | Freq: Every day | ORAL | 1 refills | Status: DC

## 2017-05-24 MED ORDER — GLIPIZIDE ER 5 MG PO TB24: ORAL_TABLET | ORAL | 2 refills | Status: DC

## 2017-05-24 NOTE — Assessment & Plan Note (Addendum)
Bp readings are elevated today and also at home for past month. He is compliant with lisinopril without adverse effects. We will continue lisinopril and add amlodipine-this was discussed in detail with pt and son. He will return in 2 weeks for follow up of new medication, blood pressure recheck and to make sure he is feeling better. - Basic metabolic panel; Future - amLODipine (NORVASC) 5 MG tablet; Take 1 tablet (5 mg total) by mouth daily.  Dispense: 30 tablet; Refill: 1

## 2017-05-24 NOTE — Progress Notes (Signed)
Name: Edward [name]   MRN: [id number]    DOB: 01/05/1956   Date:05/24/2017       Progress Note  Subjective  Chief Complaint  Chief Complaint  Patient presents with  . Establish Care    right shoulder pain over a month, diabetes check    HPI Edward [name] presents today for a diabetes follow up and also for an acute complaint of shoulder pain. His blood pressure is also elevated. He speaks some english, primarily vietnamese. His son ,who assists him with his daily medications and healthcare needs, accompanies him to his appointment today.  Diabetes- maintained on invokamet XR [phone] once daily, trulicity 1.5 once weekly and glipizide 5mg  daily Reports daily medication compliance without adverse medication effects. Home blood sugar readings today-he does not check his blood sugars at home. Denies tremor, diaphoresis, polyuria, polydipsia, polyphagia.  Lab Results  Component Value Date   HGBA1C 6.3 11/22/2016    Hypertension -maintained on lisinopril 40 once daily Reports daily medication compliance without adverse medication effects. Reports he check his blood pressure at home about once a week-he says his readings have been increasing over past month or more. Readings last week were around 148/90. He feels fatigued and had some mild intermittent headaches since his blood pressure readings have gone up. Denies syncope, vision changes, chest pain, shortness of breath, edema.  BP Readings from Last 3 Encounters:  05/24/17 (!) 156/90  11/22/16 124/72  08/24/16 118/70   Right Shoulder pain- This is a new problem. The pain began about 1 month ago. The pain does not occur every day. The pain occurs after he works on the computer all day. The pain feels like a muscle cramp. The pain radiates down his arm into his right elbow. The pain does not not increase with palpation. He denies any known injuries to the shoulder. He has tried OTC pain medication and avoiding using the computer with  relief of the pain.  He denies weakness, nausea, vomiting, numbness. He is not having the pain today.  Patient Active Problem List   Diagnosis Date Noted  . Left knee pain 02/18/2016  . Type 2 diabetes mellitus ([place]) 08/28/2015  . Essential hypertension 08/28/2015  . Routine general medical examination at a health care facility 08/28/2015    Past Surgical History:  Procedure Laterality Date  . TUMOR REMOVAL      Family History  Problem Relation Age of Onset  . Prostate cancer Father   . Heart attack Paternal Grandfather     Social History   Socioeconomic History  . Marital status: Married    Spouse name: Not on file  . Number of children: 2  . Years of education: 10  . Highest education level: Not on file  Social Needs  . Financial resource strain: Not on file  . Food insecurity - worry: Not on file  . Food insecurity - inability: Not on file  . Transportation needs - medical: Not on file  . Transportation needs - non-medical: Not on file  Occupational History  . Occupation: Loss adjuster, chartered  Tobacco Use  . Smoking status: Never Smoker  . Smokeless tobacco: Never Used  Substance and Sexual Activity  . Alcohol use: No    Alcohol/week: 0.0 oz  . Drug use: No  . Sexual activity: Not on file  Other Topics Concern  . Not on file  Social History Narrative   Fun: Computers and playing games     Current Outpatient Medications:  .  Canagliflozin-Metformin  HCl ER (INVOKAMET XR) [phone] MG TB24, Take 1 tablet by mouth daily., Disp: 30 tablet, Rfl: 0 .  Dulaglutide (TRULICITY) 1.5 HQ/4.6NG SOPN, Inject 1.5 mg into the skin once a week., Disp: 2 mL, Rfl: 5 .  Emollient (COLLAGEN EX), Apply topically. Reported on 11/19/2015, Disp: , Rfl:  .  glipiZIDE (GLUCOTROL XL) 5 MG 24 hr tablet, TAKE 1 TABLET BY MOUTH EVERY DAY WITH BREAKFAST. NEEDS APPT FOR MORE REFILLS, Disp: 30 tablet, Rfl: 0 .  glucose blood (ONE TOUCH ULTRA TEST) test strip, Use as instructed, Disp: 100 each, Rfl:  12 .  lisinopril (PRINIVIL,ZESTRIL) 40 MG tablet, Take 1 tablet (40 mg total) by mouth daily., Disp: 90 tablet, Rfl: 3 .  Misc Natural Products (OSTEO BI-FLEX ADV DOUBLE ST PO), Take by mouth., Disp: , Rfl:   No Known Allergies   ROS See HPI  Objective  Vitals:   05/24/17 1000  BP: (!) 156/90  Pulse: 79  Resp: 16  Temp: 98.1 F (36.7 C)  TempSrc: Oral  SpO2: 98%  Weight: 142 lb (64.4 kg)  Height: 5\' 5"  (1.651 m)     Body mass index is 23.63 kg/m.  Physical Exam Vital signs reviewed. Constitutional: Patient appears well-developed and well-nourished. No distress.  HENT: Head: Normocephalic and atraumatic. Eyes: Pupils are equal, round, and reactive to light. No scleral icterus.  Neck: Normal range of motion. Neck supple. Cardiovascular: Normal rate, regular rhythm and normal heart sounds. No BLE edema. Distal pulses intact. Pulmonary/Chest: Effort normal and breath sounds normal. No respiratory distress. Musculoskeletal: Normal range of motion, no joint effusions. No gross deformities. Right shoulder without tenderness, swelling. Neurological: He is alert and oriented to person, place, and time. Coordination, balance, strength, speech and gait are normal.  Skin: Skin is warm and dry. No rash noted. No erythema.  Psychiatric: Patient has a normal mood and affect. behavior is normal. Judgment and thought content normal.  PHQ2/9: Depression screen PHQ 2/9 05/24/2017  Decreased Interest 0  Down, Depressed, Hopeless 0  PHQ - 2 Score 0   Fall Risk: Fall Risk  05/24/2017  Falls in the past year? No    Assessment & Plan RTC in about 2 weeks for follow up of blood pressure- amlodipine added today  Acute pain of right shoulder Referral to sports medicine. Will obtain shoulder xray today. - DG Shoulder Right; Future

## 2017-05-24 NOTE — Assessment & Plan Note (Signed)
Stable, continue current medications pending A1c. - glipiZIDE (GLUCOTROL XL) 5 MG 24 hr tablet; TAKE 1 TABLET BY MOUTH EVERY DAY WITH BREAKFAST. NEEDS APPT FOR MORE REFILLS  Dispense: 90 tablet; Refill: 2 - Basic metabolic panel; Future - Hemoglobin A1c; Future

## 2017-05-24 NOTE — Patient Instructions (Addendum)
Please head downstairs for lab work/x-rays.  Please schedule a follow up with Dr Glyn Ade for your shoulder pain.  Please schedule a follow up with me in about 2 weeks so we can recheck your blood pressure and see how you are doing on the new blood pressure medication.  It was nice to meet you. Thanks for letting me take care of you today :)

## 2017-05-26 ENCOUNTER — Encounter: Payer: Self-pay | Admitting: Family Medicine

## 2017-05-26 ENCOUNTER — Ambulatory Visit: Payer: BLUE CROSS/BLUE SHIELD | Admitting: Family Medicine

## 2017-05-26 VITALS — BP 132/74 | HR 68 | Temp 98.4°F | Ht 65.0 in | Wt 142.0 lb

## 2017-05-26 DIAGNOSIS — M899 Disorder of bone, unspecified: Secondary | ICD-10-CM | POA: Diagnosis not present

## 2017-05-26 MED ORDER — DICLOFENAC SODIUM 2 % TD SOLN: 1.0000 "application " | Freq: Two times a day (BID) | TRANSDERMAL | 3 refills | Status: DC

## 2017-05-26 NOTE — Progress Notes (Signed)
Edward [name] - 62 y.o. male MRN [id number]  Date of birth: 08-05-1955  SUBJECTIVE:  Including CC & ROS.  Chief Complaint  Patient presents with  . Shoulder Pain    Edward [name] is a 62 y.o. male that is presenting with right shoulder pain. Ongoing for one month. Denies injury or trauma. He sits at a computer daily and is more noticeable. Pain is located posterior aspect of his right shoulder and radiates down his arm to his elbow. Described as a dull ache.  Denies tingling or numbness. He has been applying icy/heat and taking motrin with some improvement. Denies any inciting event. Pain is staying the same. Has not tried anything to improved the pain. No prior pain similar to this.   Interpreter Marijean Bravo present for interview, exam and discussion.  Review of Systems  Constitutional: Negative for fever.  HENT: Negative for sinus pain.   Respiratory: Negative for shortness of breath.   Cardiovascular: Negative for chest pain.  Gastrointestinal: Negative for abdominal pain.  Musculoskeletal: Negative for gait problem.  Skin: Negative for color change.  Neurological: Negative for weakness.  Hematological: Negative for adenopathy.  Psychiatric/Behavioral: Negative for agitation.    HISTORY: Past Medical, Surgical, Social, and Family History Reviewed & Updated per EMR.   Pertinent Historical Findings include:  Past Medical History:  Diagnosis Date  . Diabetes mellitus without complication ([place])   . Hypertension     Past Surgical History:  Procedure Laterality Date  . TUMOR REMOVAL      No Known Allergies  Family History  Problem Relation Age of Onset  . Prostate cancer Father   . Heart attack Paternal Grandfather      Social History   Socioeconomic History  . Marital status: Married    Spouse name: Not on file  . Number of children: 2  . Years of education: 1  . Highest education level: Not on file  Social Needs  . Financial resource strain: Not on file  . Food  insecurity - worry: Not on file  . Food insecurity - inability: Not on file  . Transportation needs - medical: Not on file  . Transportation needs - non-medical: Not on file  Occupational History  . Occupation: Loss adjuster, chartered  Tobacco Use  . Smoking status: Never Smoker  . Smokeless tobacco: Never Used  Substance and Sexual Activity  . Alcohol use: No    Alcohol/week: 0.0 oz  . Drug use: No  . Sexual activity: Not on file  Other Topics Concern  . Not on file  Social History Narrative   Fun: Computers and playing games     PHYSICAL EXAM:  VS: BP 132/74 (BP Location: Left Arm, Patient Position: Sitting, Cuff Size: Normal)   Pulse 68   Temp 98.4 F (36.9 C) (Oral)   Ht 5\' 5"  (1.651 m)   Wt 142 lb (64.4 kg)   SpO2 97%   BMI 23.63 kg/m  Physical Exam Gen: NAD, alert, cooperative with exam, well-appearing ENT: normal lips, normal nasal mucosa,  Eye: normal EOM, normal conjunctiva and lids CV:  no edema, +2 pedal pulses   Resp: no accessory muscle use, non-labored,  Skin: no rashes, no areas of induration  Neuro: normal tone, normal sensation to touch Psych:  normal insight, alert and oriented MSK:  Right shoulder:  Schreiter shoulder higher than left. Significant hypertrophy of the right trapezius Normal active range of motion of flexion and abduction. Normal external rotation. Normal strength resistance with internal and  external rotation. Normal empty can testing. Normal crossarm testing. Normal Hawkin's testing No scapular waking Normal neck range of motion Normal strength resistance with drug Neurovascularly intact   Limited ultrasound: Right shoulder:  Normal-appearing supraspinatus. No abnormality of the muscle belly of the supraspinatus or the trapezius.  Summary: Normal exam  Ultrasound and interpretation by Clearance Coots, MD        ASSESSMENT & PLAN:   Scapular dysfunction Appears to have significant spasm of the trapezius as well as  supraspinatus. Holding the right side significantly higher as well as hypertrophy of the right trapezius. - Referral to physical therapy. - Pennsaid provided - If no improvement consider muscle relaxer that is less sedating and/or trigger point injections. Consider imaging

## 2017-05-26 NOTE — Patient Instructions (Signed)
Please try to go to physical therapy.  I have sent a rub on medicine to the pharmacy  You can also try Aspercreme with lidocaine. If you don't have any improvement and let me know and we can try a muscle relaxer.

## 2017-05-26 NOTE — Assessment & Plan Note (Addendum)
Appears to have significant spasm of the trapezius as well as supraspinatus. Holding the right side significantly higher as well as hypertrophy of the right trapezius. - Referral to physical therapy. - Pennsaid provided - If no improvement consider muscle relaxer that is less sedating and/or trigger point injections. Consider imaging

## 2017-06-06 NOTE — Progress Notes (Signed)
Name: Edward [name]   MRN: [id number]    DOB: Nov 18, 1955   Date:06/07/2017       Progress Note  Subjective  Chief Complaint  Chief Complaint  Patient presents with  . Follow-up    blood pressure    HPI [name] presents today for a follow up of his blood pressure. He speaks some english, primarily vietnamese. He is here today with vietnamese interpreter, Maudie Mercury, from language services, to help with interpretation.  Hypertension - At his last visit with me on 2/4, his blood pressure readings were elevated. He had also noticed recent increased readings at home around 150/90 and he had been experiencing some fatigue and mild headaches. We continued his lisinopril 40 daily and added amlodipine 5-this was discussed in detail with pt and son. Today, he says he has been taking both medications as prescribed and has not noticed any adverse medication effects. Reports he has not been monitoring his blood pressure at home because he has been too busy at work. He does not routinely exercise but does follow a healthy diet of home cooked food, although he does eat a fair amount of salt. He denies headaches, vision changes, chest pain, shortness of breath, cough, edema. He overall feels well today.  BP Readings from Last 3 Encounters:  06/07/17 (!) 146/78  05/26/17 132/74  05/24/17 (!) 156/90    Patient Active Problem List   Diagnosis Date Noted  . Scapular dysfunction 05/26/2017  . Left knee pain 02/18/2016  . Type 2 diabetes mellitus ([place]) 08/28/2015  . Essential hypertension 08/28/2015  . Routine general medical examination at a health care facility 08/28/2015    Past Surgical History:  Procedure Laterality Date  . TUMOR REMOVAL      Family History  Problem Relation Age of Onset  . Prostate cancer Father   . Heart attack Paternal Grandfather     Social History   Socioeconomic History  . Marital status: Married    Spouse name: Not on file  . Number of children: 2  . Years of  education: 31  . Highest education level: Not on file  Social Needs  . Financial resource strain: Not on file  . Food insecurity - worry: Not on file  . Food insecurity - inability: Not on file  . Transportation needs - medical: Not on file  . Transportation needs - non-medical: Not on file  Occupational History  . Occupation: Loss adjuster, chartered  Tobacco Use  . Smoking status: Never Smoker  . Smokeless tobacco: Never Used  Substance and Sexual Activity  . Alcohol use: No    Alcohol/week: 0.0 oz  . Drug use: No  . Sexual activity: Not on file  Other Topics Concern  . Not on file  Social History Narrative   Fun: Computers and playing games     Current Outpatient Medications:  .  amLODipine (NORVASC) 5 MG tablet, Take 1 tablet (5 mg total) by mouth daily., Disp: 30 tablet, Rfl: 1 .  Canagliflozin-Metformin HCl ER (INVOKAMET XR) [phone] MG TB24, Take 1 tablet by mouth daily., Disp: 30 tablet, Rfl: 0 .  Diclofenac Sodium (PENNSAID) 2 % SOLN, Place 1 application onto the skin 2 (two) times daily., Disp: 1 Bottle, Rfl: 3 .  Dulaglutide (TRULICITY) 1.5 WN/0.2VO SOPN, Inject 1.5 mg into the skin once a week., Disp: 2 mL, Rfl: 5 .  Emollient (COLLAGEN EX), Apply topically. Reported on 11/19/2015, Disp: , Rfl:  .  glipiZIDE (GLUCOTROL XL) 5 MG 24 hr tablet,  TAKE 1 TABLET BY MOUTH EVERY DAY WITH BREAKFAST. NEEDS APPT FOR MORE REFILLS, Disp: 90 tablet, Rfl: 2 .  glucose blood (ONE TOUCH ULTRA TEST) test strip, Use as instructed, Disp: 100 each, Rfl: 12 .  lisinopril (PRINIVIL,ZESTRIL) 40 MG tablet, Take 1 tablet (40 mg total) by mouth daily., Disp: 90 tablet, Rfl: 3 .  Misc Natural Products (OSTEO BI-FLEX ADV DOUBLE ST PO), Take by mouth., Disp: , Rfl:   No Known Allergies   ROS See HPI  Objective  Vitals:   06/07/17 1037  BP: (!) 146/78  Pulse: 98  Resp: 16  Temp: 98.5 F (36.9 C)  TempSrc: Oral  SpO2: 97%  Weight: 145 lb (65.8 kg)  Height: 5\' 5"  (1.651 m)    Body mass index is  24.13 kg/m.  Physical Exam Constitutional: Patient appears well-developed and well-nourished. No distress.  HENT: Head: Normocephalic and atraumatic. Nose: Nose normal. Mouth/Throat: Oropharynx is clear and moist. No oropharyngeal exudate.  Eyes: Conjunctivae are normal. No scleral icterus.  Neck: Normal range of motion. Neck supple.  Cardiovascular: Normal rate, regular rhythm and normal heart sounds.  No murmur heard. No BLE edema. Distal pulses intact. Pulmonary/Chest: Effort normal and breath sounds normal. No respiratory distress. Abdominal: Soft. No distension. Musculoskeletal: Normal range of motion, no joint effusions. No gross deformities Neurological: He is alert and oriented to person, place, and time. Coordination, balance, strength, speech and gait are normal.  Skin: Skin is warm and dry. No rash noted. No erythema.  Psychiatric: Patient has a normal mood and affect. behavior is normal. Judgment and thought content normal. Vital signs reviewed.  PHQ2/9: Depression screen PHQ 2/9 05/24/2017  Decreased Interest 0  Down, Depressed, Hopeless 0  PHQ - 2 Score 0    Fall Risk: Fall Risk  05/24/2017  Falls in the past year? No   Assessment & Plan RTC in about 2-3 weeks for F/U of amlodipine dosage increase.

## 2017-06-07 ENCOUNTER — Ambulatory Visit: Payer: BLUE CROSS/BLUE SHIELD | Admitting: Nurse Practitioner

## 2017-06-07 ENCOUNTER — Encounter: Payer: Self-pay | Admitting: Nurse Practitioner

## 2017-06-07 DIAGNOSIS — I10 Essential (primary) hypertension: Secondary | ICD-10-CM | POA: Diagnosis not present

## 2017-06-07 DIAGNOSIS — E1140 Type 2 diabetes mellitus with diabetic neuropathy, unspecified: Secondary | ICD-10-CM | POA: Diagnosis not present

## 2017-06-07 MED ORDER — AMLODIPINE BESYLATE 10 MG PO TABS: 10.0000 mg | ORAL_TABLET | Freq: Every day | ORAL | 1 refills | Status: DC

## 2017-06-07 MED ORDER — LISINOPRIL 40 MG PO TABS: 40.0000 mg | ORAL_TABLET | Freq: Every day | ORAL | 3 refills | Status: DC

## 2017-06-07 NOTE — Assessment & Plan Note (Addendum)
BP remains elevated, will continue lisinopril and increase amlodipine to 10 daily We also discussed the role of healthy diet and exercise and the reduction of salt in the management of hypertension-See AVS for education provided to patient - lisinopril (PRINIVIL,ZESTRIL) 40 MG tablet; Take 1 tablet (40 mg total) by mouth daily.  Dispense: 90 tablet; Refill: 3 - amLODipine (NORVASC) 10 MG tablet; Take 1 tablet (10 mg total) by mouth daily.  Dispense: 30 tablet; Refill: 1 He will RTC in about 2-3 weeks for follow up of amlodipine adjusmtent

## 2017-06-07 NOTE — Patient Instructions (Addendum)
Please INCREASE your amlodipine to 10mg  daily. Please continue your lisinopril as you have been taking it.  Please work on your diet and exercise as we discussed. Half of your plate should be veggies, one-fourth carbs, one-fourth meat, and don't eat meat at every meal. Also, remember to stay away from sugary drinks. I'd like for you to start incorporating exercise into your daily schedule. Start at 10 minutes a day, working up to 30 minutes five times a week.   Please return in about 2-3 weeks, so we can see how your blood pressure is doing on the new medication dosage.  It was nice to see you. Thanks for letting me take care of you today :)  Mediterranean Diet A Mediterranean diet refers to food and lifestyle choices that are based on the traditions of countries located on the [employer]. This way of eating has been shown to help prevent certain conditions and improve outcomes for people who have chronic diseases, like kidney disease and heart disease. What are tips for following this plan? Lifestyle  Cook and eat meals together with your family, when possible.  Drink enough fluid to keep your urine clear or pale yellow.  Be physically active every day. This includes: ? Aerobic exercise like running or swimming. ? Leisure activities like gardening, walking, or housework.  Get 7-8 hours of sleep each night.  If recommended by your health care provider, drink red wine in moderation. This means 1 glass a day for nonpregnant women and 2 glasses a day for men. A glass of wine equals 5 oz (150 mL). Reading food labels  Check the serving size of packaged foods. For foods such as rice and pasta, the serving size refers to the amount of cooked product, not dry.  Check the total fat in packaged foods. Avoid foods that have saturated fat or trans fats.  Check the ingredients list for added sugars, such as corn syrup. Shopping  At the grocery store, buy most of your food from the areas  near the walls of the store. This includes: ? Fresh fruits and vegetables (produce). ? Grains, beans, nuts, and seeds. Some of these may be available in unpackaged forms or large amounts (in bulk). ? Fresh seafood. ? Poultry and eggs. ? Low-fat dairy products.  Buy whole ingredients instead of prepackaged foods.  Buy fresh fruits and vegetables in-season from local farmers markets.  Buy frozen fruits and vegetables in resealable bags.  If you do not have access to quality fresh seafood, buy precooked frozen shrimp or canned fish, such as tuna, salmon, or sardines.  Buy small amounts of raw or cooked vegetables, salads, or olives from the deli or salad bar at your store.  Stock your pantry so you always have certain foods on hand, such as olive oil, canned tuna, canned tomatoes, rice, pasta, and beans. Cooking  Cook foods with extra-virgin olive oil instead of using butter or other vegetable oils.  Have meat as a side dish, and have vegetables or grains as your main dish. This means having meat in small portions or adding small amounts of meat to foods like pasta or stew.  Use beans or vegetables instead of meat in common dishes like chili or lasagna.  Experiment with different cooking methods. Try roasting or broiling vegetables instead of steaming or sauteing them.  Add frozen vegetables to soups, stews, pasta, or rice.  Add nuts or seeds for added healthy fat at each meal. You can add these to yogurt,  salads, or vegetable dishes.  Marinate fish or vegetables using olive oil, lemon juice, garlic, and fresh herbs. Meal planning  Plan to eat 1 vegetarian meal one day each week. Try to work up to 2 vegetarian meals, if possible.  Eat seafood 2 or more times a week.  Have healthy snacks readily available, such as: ? Vegetable sticks with hummus. ? [place] yogurt. ? Fruit and nut trail mix.  Eat balanced meals throughout the week. This includes: ? Fruit: 2-3 servings a  day ? Vegetables: 4-5 servings a day ? Low-fat dairy: 2 servings a day ? Fish, poultry, or lean meat: 1 serving a day ? Beans and legumes: 2 or more servings a week ? Nuts and seeds: 1-2 servings a day ? Whole grains: 6-8 servings a day ? Extra-virgin olive oil: 3-4 servings a day  Limit red meat and sweets to only a few servings a month What are my food choices?  Mediterranean diet ? Recommended ? Grains: Whole-grain pasta. Brown rice. Bulgar wheat. Polenta. Couscous. Whole-wheat bread. [place]. ? Vegetables: Artichokes. Beets. Broccoli. Cabbage. Carrots. Eggplant. Green beans. Chard. Kale. Spinach. Onions. Leeks. Peas. Squash. Tomatoes. Peppers. Radishes. ? Fruits: Apples. Apricots. Avocado. Berries. Bananas. Cherries. Dates. Figs. Grapes. Lemons. Melon. Oranges. Peaches. Plums. Pomegranate. ? Meats and other protein foods: Beans. Almonds. Sunflower seeds. Pine nuts. Peanuts. [place]. Salmon. [place]all[place]. Shrimp. [place]. Tilapia. Clams. Oysters. Eggs. ? Dairy: Low-fat milk. Cheese. Greek yogurt. ? Beverages: Water. Red wine. Herbal tea. ? Fats and oils: Extra virgin olive oil. Avocado oil. Grape seed oil. ? Sweets and desserts: [place] yogurt with honey. Baked apples. Poached pears. Trail mix. ? Seasoning and other foods: Basil. Cilantro. Coriander. Cumin. Mint. Parsley. Sage. Rosemary. Tarragon. Garlic. Oregano. Thyme. Pepper. Balsalmic vinegar. Tahini. Hummus. Tomato sauce. Olives. Mushrooms. ? Limit these ? Grains: Prepackaged pasta or rice dishes. Prepackaged cereal with added sugar. ? Vegetables: Deep fried potatoes (french fries). ? Fruits: Fruit canned in syrup. ? Meats and other protein foods: Beef. Pork. Lamb. Poultry with skin. Hot dogs. [place] Salines. ? Dairy: Ice cream. Sour cream. Whole milk. ? Beverages: Juice. Sugar-sweetened soft drinks. Beer. Liquor and spirits. ? Fats and oils: Butter. Canola oil. Vegetable oil. Beef fat (tallow). Lard. ? Sweets and desserts: Cookies. Cakes.  Pies. Candy. ? Seasoning and other foods: Mayonnaise. Premade sauces and marinades. ? The items listed may not be a complete list. Talk with your dietitian about what dietary choices are right for you. Summary  The Mediterranean diet includes both food and lifestyle choices.  Eat a variety of fresh fruits and vegetables, beans, nuts, seeds, and whole grains.  Limit the amount of red meat and sweets that you eat.  Talk with your health care provider about whether it is safe for you to drink red wine in moderation. This means 1 glass a day for nonpregnant women and 2 glasses a day for men. A glass of wine equals 5 oz (150 mL). This information is not intended to replace advice given to you by your health care provider. Make sure you discuss any questions you have with your health care provider. Document Released: 12/11/2015 Document Revised: 01/13/2016 Document Reviewed: 12/11/2015 Elsevier Interactive Patient Education  [employer].

## 2017-06-28 ENCOUNTER — Other Ambulatory Visit: Payer: Self-pay

## 2017-06-28 MED ORDER — DULAGLUTIDE 1.5 MG/0.5ML ~~LOC~~ SOAJ: 1.5000 mg | SUBCUTANEOUS | 5 refills | Status: DC

## 2017-07-06 ENCOUNTER — Ambulatory Visit: Payer: BLUE CROSS/BLUE SHIELD | Admitting: Nurse Practitioner

## 2017-07-06 ENCOUNTER — Encounter: Payer: Self-pay | Admitting: Nurse Practitioner

## 2017-07-06 VITALS — BP 144/80 | HR 89 | Temp 98.6°F | Resp 16 | Ht 65.0 in | Wt 144.0 lb

## 2017-07-06 DIAGNOSIS — I10 Essential (primary) hypertension: Secondary | ICD-10-CM | POA: Diagnosis not present

## 2017-07-06 NOTE — Assessment & Plan Note (Signed)
BP mildly elevated in the clinic today, is stable at home. He feels that his reading may be elevated today due to being in clinical setting His headaches and fatigue have improved. He will continue to monitor BP at home and return in about 1 month to make sure his blood pressure remains stable He will bring his home monitor for calibration at next OV

## 2017-07-06 NOTE — Patient Instructions (Signed)
Please continue to check your blood pressure once daily or at least a few times a week, at the same time each day, and keep a log. Our goal is to keep your blood pressure under 140/90.  Return in about 1 month so that we can make sure your blood pressure remains stable.

## 2017-07-06 NOTE — Progress Notes (Signed)
Name: Edward [name]   MRN: [id number]    DOB: 25-Sep-1955   Date:07/06/2017       Progress Note  Subjective  Chief Complaint  Chief Complaint  Patient presents with  . Follow-up    blood pressure    HPI   Hypertension -maintained on lisinopril 40, amlodipine was increased to 10 at last visit for elevated readings. Reports he has been taking both medications daily as prescribed with no noted adverse medication effects. Reports he has been checking blood pressures at home every other day with readings 130s/70s. He was actually complaining of some headaches and fatigue at last OV which have resolved with improvement in his blood pressures.  Denies headaches, vision changes, chest pain, shortness of breath, edema. He says he feels well today.  BP Readings from Last 3 Encounters:  07/06/17 (!) 144/80  06/07/17 (!) 146/78  05/26/17 132/74     Patient Active Problem List   Diagnosis Date Noted  . Scapular dysfunction 05/26/2017  . Left knee pain 02/18/2016  . Type 2 diabetes mellitus ([place]) 08/28/2015  . Essential hypertension 08/28/2015  . Routine general medical examination at a health care facility 08/28/2015    Past Surgical History:  Procedure Laterality Date  . TUMOR REMOVAL      Family History  Problem Relation Age of Onset  . Prostate cancer Father   . Heart attack Paternal Grandfather     Social History   Socioeconomic History  . Marital status: Married    Spouse name: Not on file  . Number of children: 2  . Years of education: 24  . Highest education level: Not on file  Social Needs  . Financial resource strain: Not on file  . Food insecurity - worry: Not on file  . Food insecurity - inability: Not on file  . Transportation needs - medical: Not on file  . Transportation needs - non-medical: Not on file  Occupational History  . Occupation: Loss adjuster, chartered  Tobacco Use  . Smoking status: Never Smoker  . Smokeless tobacco: Never Used  Substance and Sexual  Activity  . Alcohol use: No    Alcohol/week: 0.0 oz  . Drug use: No  . Sexual activity: Not on file  Other Topics Concern  . Not on file  Social History Narrative   Fun: Computers and playing games     Current Outpatient Medications:  .  amLODipine (NORVASC) 10 MG tablet, Take 1 tablet (10 mg total) by mouth daily., Disp: 30 tablet, Rfl: 1 .  Canagliflozin-Metformin HCl ER (INVOKAMET XR) [phone] MG TB24, Take 1 tablet by mouth daily., Disp: 30 tablet, Rfl: 0 .  Diclofenac Sodium (PENNSAID) 2 % SOLN, Place 1 application onto the skin 2 (two) times daily., Disp: 1 Bottle, Rfl: 3 .  Dulaglutide (TRULICITY) 1.5 DP/8.2UM SOPN, Inject 1.5 mg into the skin once a week., Disp: 2 mL, Rfl: 5 .  glipiZIDE (GLUCOTROL XL) 5 MG 24 hr tablet, TAKE 1 TABLET BY MOUTH EVERY DAY WITH BREAKFAST. NEEDS APPT FOR MORE REFILLS, Disp: 90 tablet, Rfl: 2 .  glucose blood (ONE TOUCH ULTRA TEST) test strip, Use as instructed, Disp: 100 each, Rfl: 12 .  lisinopril (PRINIVIL,ZESTRIL) 40 MG tablet, Take 1 tablet (40 mg total) by mouth daily., Disp: 90 tablet, Rfl: 3 .  Misc Natural Products (OSTEO BI-FLEX ADV DOUBLE ST PO), Take by mouth., Disp: , Rfl:   No Known Allergies   ROS See HPI  Objective  Vitals:   07/06/17 1123  BP: (!) 144/80  Pulse: 89  Resp: 16  Temp: 98.6 F (37 C)  TempSrc: Oral  SpO2: 98%  Weight: 144 lb (65.3 kg)  Height: 5\' 5"  (1.651 m)   Body mass index is 23.96 kg/m.  Physical Exam Vital signs reviewed. Constitutional: Patient appears well-developed and well-nourished. No distress.  HENT: Head: Normocephalic and atraumatic. Nose: Nose normal. Mouth/Throat: Oropharynx is clear and moist.  Eyes: Conjunctivae are normal. No scleral icterus.  Neck: Normal range of motion. Neck supple.  Cardiovascular: Normal rate, regular rhythm and normal heart sounds.  No BLE edema. Distal pulses intact. Pulmonary/Chest: Effort normal and breath sounds normal. No respiratory  distress. Musculoskeletal: Normal range of motion. No gross deformities Neurological: He is alert and oriented to person, place, and time. Coordination, balance, strength, speech and gait are normal.  Skin: Skin is warm and dry. No rash noted. No erythema.  Psychiatric: Patient has a normal mood and affect. behavior is normal. Judgment and thought content normal.  Assessment & Plan RTC in about 1 month for follow up of HTN

## 2017-07-11 ENCOUNTER — Other Ambulatory Visit: Payer: Self-pay

## 2017-07-11 DIAGNOSIS — I10 Essential (primary) hypertension: Secondary | ICD-10-CM

## 2017-07-11 MED ORDER — AMLODIPINE BESYLATE 10 MG PO TABS: 10.0000 mg | ORAL_TABLET | Freq: Every day | ORAL | 1 refills | Status: DC

## 2017-07-19 ENCOUNTER — Other Ambulatory Visit: Payer: Self-pay

## 2017-07-19 MED ORDER — CANAGLIFLOZIN-METFORMIN HCL ER 150-1000 MG PO TB24: 1.0000 | ORAL_TABLET | Freq: Every day | ORAL | 0 refills | Status: DC

## 2017-07-22 ENCOUNTER — Encounter: Payer: Self-pay | Admitting: Family Medicine

## 2017-08-02 ENCOUNTER — Other Ambulatory Visit: Payer: Self-pay

## 2017-08-02 DIAGNOSIS — I10 Essential (primary) hypertension: Secondary | ICD-10-CM

## 2017-08-02 MED ORDER — AMLODIPINE BESYLATE 10 MG PO TABS: 10.0000 mg | ORAL_TABLET | Freq: Every day | ORAL | 1 refills | Status: DC

## 2017-08-09 ENCOUNTER — Other Ambulatory Visit: Payer: Self-pay | Admitting: *Deleted

## 2017-08-09 DIAGNOSIS — I10 Essential (primary) hypertension: Secondary | ICD-10-CM

## 2017-08-09 MED ORDER — AMLODIPINE BESYLATE 10 MG PO TABS: 10.0000 mg | ORAL_TABLET | Freq: Every day | ORAL | 1 refills | Status: DC

## 2017-08-16 ENCOUNTER — Ambulatory Visit: Payer: BLUE CROSS/BLUE SHIELD | Admitting: Nurse Practitioner

## 2017-08-22 ENCOUNTER — Other Ambulatory Visit: Payer: Self-pay

## 2017-08-22 MED ORDER — CANAGLIFLOZIN-METFORMIN HCL ER 150-1000 MG PO TB24: 1.0000 | ORAL_TABLET | Freq: Every day | ORAL | 3 refills | Status: DC

## 2017-10-04 ENCOUNTER — Ambulatory Visit: Payer: BLUE CROSS/BLUE SHIELD | Admitting: Nurse Practitioner

## 2017-10-04 ENCOUNTER — Encounter: Payer: Self-pay | Admitting: Nurse Practitioner

## 2017-10-04 ENCOUNTER — Encounter

## 2017-10-04 VITALS — BP 134/80 | HR 77 | Temp 98.0°F | Resp 16 | Ht 65.0 in | Wt 142.8 lb

## 2017-10-04 DIAGNOSIS — I10 Essential (primary) hypertension: Secondary | ICD-10-CM | POA: Diagnosis not present

## 2017-10-04 DIAGNOSIS — E1140 Type 2 diabetes mellitus with diabetic neuropathy, unspecified: Secondary | ICD-10-CM

## 2017-10-04 LAB — POCT GLYCOSYLATED HEMOGLOBIN (HGB A1C): Hemoglobin A1C: 6.1 % — AB (ref 4.0–5.6)

## 2017-10-04 NOTE — Patient Instructions (Addendum)
Please try to check your blood pressure once daily or at least a few times a week, at the same time each day, and keep a log. Please follow up for readings over 140/90.  STOP YOUR GLIPIZIDE. Continue invokamet and trulicity as you have been taking. Please record your blood sugars in a log twice daily, once in the morning when you first wake up up before eating anything and then again 2 hours after dinner or before bedtime. Please follow up for readings <80, >200.  Please return in 1 month with blood sugar logs for follow up.  Hypoglycemia Hypoglycemia is when the sugar (glucose) level in the blood is too low. Symptoms of low blood sugar may include:  Feeling: ? Hungry. ? Worried or nervous (anxious). ? Sweaty and clammy. ? Confused. ? Dizzy. ? Sleepy. ? Sick to your stomach (nauseous).  Having: ? A fast heartbeat. ? A headache. ? A change in your vision. ? Jerky movements that you cannot control (seizure). ? Nightmares. ? Tingling or no feeling (numbness) around the mouth, lips, or tongue.  Having trouble with: ? Talking. ? Paying attention (concentrating). ? Moving (coordination). ? Sleeping.  Shaking.  Passing out (fainting).  Getting upset easily (irritability).  Low blood sugar can happen to people who have diabetes and people who do not have diabetes. Low blood sugar can happen quickly, and it can be an emergency. Treating Low Blood Sugar Low blood sugar is often treated by eating or drinking something sugary right away. If you can think clearly and swallow safely, follow the 15:15 rule:  Take 15 grams of a fast-acting carb (carbohydrate). Some fast-acting carbs are: ? 1 tube of glucose gel. ? 3 sugar tablets (glucose pills). ? 6-8 pieces of hard candy. ? 4 oz (120 mL) of fruit juice. ? 4 oz (120 mL) of regular (not diet) soda.  Check your blood sugar 15 minutes after you take the carb.  If your blood sugar is still at or below 70 mg/dL (3.9 mmol/L), take  15 grams of a carb again.  If your blood sugar does not go above 70 mg/dL (3.9 mmol/L) after 3 tries, get help right away.  After your blood sugar goes back to normal, eat a meal or a snack within 1 hour.  Treating Very Low Blood Sugar If your blood sugar is at or below 54 mg/dL (3 mmol/L), you have very low blood sugar (severe hypoglycemia). This is an emergency. Do not wait to see if the symptoms will go away. Get medical help right away. Call your local emergency services (911 in the [place] Do not drive yourself to the hospital. If you have very low blood sugar and you cannot eat or drink, you may need a glucagon shot (injection). A family member or friend should learn how to check your blood sugar and how to give you a glucagon shot. Ask your doctor if you need to have a glucagon shot kit at home. Follow these instructions at home: General instructions  Avoid any diets that cause you to not eat enough food. Talk with your doctor before you start any new diet.  Take over-the-counter and prescription medicines only as told by your doctor.  Limit alcohol to no more than 1 drink per day for nonpregnant women and 2 drinks per day for men. One drink equals 12 oz of beer, 5 oz of wine, or 1 oz of hard liquor.  Keep all follow-up visits as told by your doctor. This is important.  If You Have Diabetes:   Make sure you know the symptoms of low blood sugar.  Always keep a source of sugar with you, such as: ? Sugar. ? Sugar tablets. ? Glucose gel. ? Fruit juice. ? Regular soda (not diet soda). ? Milk. ? Hard candy. ? Honey.  Take your medicines as told.  Follow your exercise and meal plan. ? Eat on time. Do not skip meals. ? Follow your sick day plan when you cannot eat or drink normally. Make this plan ahead of time with your doctor.  Check your blood sugar as often as told by your doctor. Always check before and after exercise.  Share your diabetes care plan with: ? Your work or  school. ? People you live with.  Check your pee (urine) for ketones: ? When you are sick. ? As told by your doctor.  Carry a card or wear jewelry that says you have diabetes. If You Have Low Blood Sugar From Other Causes:   Check your blood sugar as often as told by your doctor.  Follow instructions from your doctor about what you cannot eat or drink. Contact a doctor if:  You have trouble keeping your blood sugar in your target range.  You have low blood sugar often. Get help right away if:  You still have symptoms after you eat or drink something sugary.  Your blood sugar is at or below 54 mg/dL (3 mmol/L).  You have jerky movements that you cannot control.  You pass out. These symptoms may be an emergency. Do not wait to see if the symptoms will go away. Get medical help right away. Call your local emergency services (911 in the [place] Do not drive yourself to the hospital. This information is not intended to replace advice given to you by your health care provider. Make sure you discuss any questions you have with your health care provider. Document Released: 07/14/2009 Document Revised: 09/25/2015 Document Reviewed: 05/23/2015 Elsevier Interactive Patient Education  [employer].

## 2017-10-04 NOTE — Progress Notes (Signed)
Name: Edward [name]   MRN: [id number]    DOB: [birth year]   Date:10/04/2017       Progress Note  Subjective  Chief Complaint  Chief Complaint  Patient presents with  . Follow-up    wants to talk about medication dosage for diabetes thinks his dose it to high and has had low bs, blood pressure    HPI Edward [name] is here today for a follow up of his blood pressure and diabetes. He is primarily vietnamese speaking, accompanied by daughter today who assists with his medical care and translates.  Diabetes- maintained on invokamet XR [id number] once daily, trulicity 1.5 once weekly and glipizide 5mg  daily. Reports daily medication compliance. Reports several episodes recently where he feels shaky, sweaty, tired and hungry, he has checked his blood sugar during these episodes with readings around 80s. The episodes occur when he wakes or early in the morning while he is fasting, prior to eating breakfast. His highest recent readings are in the 130s. He says he does try to eat 3-4 balanced meals a day and does not skip meals. Denies syncope, polyuria, polydipsia.  Lab Results  Component Value Date   HGBA1C 6.9 (H) 05/24/2017    Hypertension -maintained on lisinopril 40 and amlodipine 10. At his last OV in March, his BP reading was slightly elevated and we planned for him to keep a blood pressure log at home and return in 1 month for follow up, he actually returned today and tells me that he has continued to check his blood pressure regularly at home with recent readings 140s/80s. He reports daily medication compliance without noted adverse medication effects. Denies headaches, vision changes, chest pain, shortness of breath, edema.  BP Readings from Last 3 Encounters:  10/04/17 134/80  07/06/17 (!) 144/80  06/07/17 (!) 146/78     Patient Active Problem List   Diagnosis Date Noted  . Scapular dysfunction 05/26/2017  . Left knee pain 02/18/2016  . Type 2 diabetes mellitus ([place]) 08/28/2015  .  Essential hypertension 08/28/2015  . Routine general medical examination at a health care facility 08/28/2015    Past Surgical History:  Procedure Laterality Date  . TUMOR REMOVAL      Family History  Problem Relation Age of Onset  . Prostate cancer Father   . Heart attack Paternal Grandfather     Social History   Socioeconomic History  . Marital status: Married    Spouse name: Not on file  . Number of children: 2  . Years of education: 35  . Highest education level: Not on file  Occupational History  . Occupation: Engineer, mining  . Financial resource strain: Not on file  . Food insecurity:    Worry: Not on file    Inability: Not on file  . Transportation needs:    Medical: Not on file    Non-medical: Not on file  Tobacco Use  . Smoking status: Never Smoker  . Smokeless tobacco: Never Used  Substance and Sexual Activity  . Alcohol use: No    Alcohol/week: 0.0 oz  . Drug use: No  . Sexual activity: Not on file  Lifestyle  . Physical activity:    Days per week: Not on file    Minutes per session: Not on file  . Stress: Not on file  Relationships  . Social connections:    Talks on phone: Not on file    Gets together: Not on file    Attends religious service: Not  on file    Active member of club or organization: Not on file    Attends meetings of clubs or organizations: Not on file    Relationship status: Not on file  . Intimate partner violence:    Fear of current or ex partner: Not on file    Emotionally abused: Not on file    Physically abused: Not on file    Forced sexual activity: Not on file  Other Topics Concern  . Not on file  Social History Narrative   Fun: Computers and playing games     Current Outpatient Medications:  .  amLODipine (NORVASC) 10 MG tablet, Take 1 tablet (10 mg total) by mouth daily., Disp: 30 tablet, Rfl: 1 .  Canagliflozin-metFORMIN HCl ER (INVOKAMET XR) [phone] MG TB24, Take 1 tablet by mouth daily., Disp: 30  tablet, Rfl: 3 .  Diclofenac Sodium (PENNSAID) 2 % SOLN, Place 1 application onto the skin 2 (two) times daily., Disp: 1 Bottle, Rfl: 3 .  Dulaglutide (TRULICITY) 1.5 WU/9.8JX SOPN, Inject 1.5 mg into the skin once a week., Disp: 2 mL, Rfl: 5 .  glipiZIDE (GLUCOTROL XL) 5 MG 24 hr tablet, TAKE 1 TABLET BY MOUTH EVERY DAY WITH BREAKFAST. NEEDS APPT FOR MORE REFILLS, Disp: 90 tablet, Rfl: 2 .  glucose blood (ONE TOUCH ULTRA TEST) test strip, Use as instructed, Disp: 100 each, Rfl: 12 .  lisinopril (PRINIVIL,ZESTRIL) 40 MG tablet, Take 1 tablet (40 mg total) by mouth daily., Disp: 90 tablet, Rfl: 3 .  Misc Natural Products (OSTEO BI-FLEX ADV DOUBLE ST PO), Take by mouth., Disp: , Rfl:   No Known Allergies   ROS See HPI  Objective  Vitals:   10/04/17 1138  BP: 134/80  Pulse: 77  Resp: 16  Temp: 98 F (36.7 C)  TempSrc: Oral  SpO2: 98%  Weight: 142 lb 12.8 oz (64.8 kg)  Height: 5\' 5"  (1.651 m)    Body mass index is 23.76 kg/m.  Physical Exam Vital signs reviewed. Constitutional: Patient appears well-developed and well-nourished. No distress.  HENT: Head: Normocephalic and atraumatic. Nose: Nose normal. Mouth/Throat: Oropharynx is clear and moist.  Eyes: Conjunctivae are normal. No scleral icterus.  Neck: Normal range of motion. Neck supple.  Cardiovascular: Normal rate, regular rhythm and normal heart sounds. No BLE edema. Distal pulses intact. Pulmonary/Chest: Effort normal and breath sounds normal. No respiratory distress. Musculoskeletal: Normal range of motion. No gross deformities Neurological:He is alert and oriented to person, place, and time.Coordination, balance, strength, speech and gait are normal.  Skin: Skin is warm and dry. No rash noted. No erythema.  Psychiatric: Patient has a normal mood and affect. behavior is normal. Judgment and thought content normal.  Assessment & Plan RTC in 1 month: DM- stopping glipizde, bring blood sugar logs; HTN- recheck BP

## 2017-10-24 ENCOUNTER — Encounter: Payer: Self-pay | Admitting: Nurse Practitioner

## 2017-10-24 ENCOUNTER — Other Ambulatory Visit: Payer: Self-pay

## 2017-10-24 MED ORDER — CANAGLIFLOZIN-METFORMIN HCL ER 150-1000 MG PO TB24: 1.0000 | ORAL_TABLET | Freq: Every day | ORAL | 1 refills | Status: DC

## 2017-10-24 NOTE — Assessment & Plan Note (Signed)
-   POCT HgB A1C-6.1 Due to reported episodes of hypoglycemia and A1c of 6.1 today, will stop  glipizide Continue invokamet and trulicity Discussed home management of hypoglycemia including return precautions and printed additional information on AVS Continue to monitor home glucose readings RTC in 1 month for F/U: review blood glucose logs, return sooner for readings <80, >200

## 2017-10-24 NOTE — Assessment & Plan Note (Signed)
BP reading is normal in the clinic today, normal to slightly elevated readings at home He declines further medication adjustments at todays visit, would prefer to continue monitoring BP at home RTC in 1 month for F/U or sooner for home readings > 140/90

## 2017-11-08 ENCOUNTER — Ambulatory Visit: Payer: BLUE CROSS/BLUE SHIELD | Admitting: Nurse Practitioner

## 2017-11-08 ENCOUNTER — Encounter: Payer: Self-pay | Admitting: Nurse Practitioner

## 2017-11-08 VITALS — BP 140/80 | HR 87 | Temp 98.1°F | Resp 16 | Ht 65.0 in | Wt 142.0 lb

## 2017-11-08 DIAGNOSIS — I10 Essential (primary) hypertension: Secondary | ICD-10-CM | POA: Diagnosis not present

## 2017-11-08 DIAGNOSIS — E1140 Type 2 diabetes mellitus with diabetic neuropathy, unspecified: Secondary | ICD-10-CM | POA: Diagnosis not present

## 2017-11-08 NOTE — Assessment & Plan Note (Signed)
Home glucose readings appear stable on current medications, continue meds at current dosages RTC in 3 months for CPE, DM follow up, recheck A1c, or sooner for high or low glucose readings - HM DIABETES FOOT EXAM; Future

## 2017-11-08 NOTE — Assessment & Plan Note (Signed)
Stable Continue current medications Continue to monitor home BP readings RTC in 3 months for CPE, HTN follow up, recheck BP, or sooner for readings >140/90 at home

## 2017-11-08 NOTE — Patient Instructions (Signed)
Please return in about 3 months for annual physical with fasting lab work. We will recheck your A1c and blood pressure that day as well.

## 2017-11-08 NOTE — Progress Notes (Signed)
Name: Edward [name]   MRN: [id number]    DOB: 1955/09/23   Date:11/08/2017       Progress Note  Subjective  Chief Complaint  Chief Complaint  Patient presents with  . Follow-up    blood sugars    HPI Edward [name] is here today for a follow up of his blood pressure and diabetes. He is primarily vietnamese speaking, accompanied by daughter today who assists with his medical care and translates.  Diabetes- maintained on  invokamet XR [phone] once daily, trulicity 1.5 once weekly. At his last visit on 10/04/17 we stopped his glipizide 5 daily due to episodes of hypoglycemia. Reports he has made medication changes as instructed and continues to monitor his blood sugar regularly, recent readings around 120s-130s and no readings <100 since stopping the glipizde.  He also reports feeling overall better, not as fatigued, no more episodes of hypoglycemia since stopping glipizide. Denies tremor, diaphoresis, polyuria, polydipsia, polyphagia.  Lab Results  Component Value Date   HGBA1C 6.1 (A) 10/04/2017   Hypertension -maintained on lisinopril 40 and amlodipine 10 daily. BP readings were slightly elevated at last OV on 10/04/17 so we discussed keeping a home BP log and following up In 1 month to recheck BP Reports daily medication compliance without adverse medication effects. Reports he has continued to check his blood pressure routinely at home, recent readings 130s/70s Denies headaches, vision changes, chest pain, shortness of breath, edema.  BP Readings from Last 3 Encounters:  11/08/17 140/80  10/04/17 134/80  07/06/17 (!) 144/80     Patient Active Problem List   Diagnosis Date Noted  . Scapular dysfunction 05/26/2017  . Left knee pain 02/18/2016  . Type 2 diabetes mellitus ([place]) 08/28/2015  . Essential hypertension 08/28/2015  . Routine general medical examination at a health care facility 08/28/2015    Past Surgical History:  Procedure Laterality Date  . TUMOR REMOVAL      Family  History  Problem Relation Age of Onset  . Prostate cancer Father   . Heart attack Paternal Grandfather     Social History   Socioeconomic History  . Marital status: Married    Spouse name: Not on file  . Number of children: 2  . Years of education: 33  . Highest education level: Not on file  Occupational History  . Occupation: Engineer, mining  . Financial resource strain: Not on file  . Food insecurity:    Worry: Not on file    Inability: Not on file  . Transportation needs:    Medical: Not on file    Non-medical: Not on file  Tobacco Use  . Smoking status: Never Smoker  . Smokeless tobacco: Never Used  Substance and Sexual Activity  . Alcohol use: No    Alcohol/week: 0.0 oz  . Drug use: No  . Sexual activity: Not on file  Lifestyle  . Physical activity:    Days per week: Not on file    Minutes per session: Not on file  . Stress: Not on file  Relationships  . Social connections:    Talks on phone: Not on file    Gets together: Not on file    Attends religious service: Not on file    Active member of club or organization: Not on file    Attends meetings of clubs or organizations: Not on file    Relationship status: Not on file  . Intimate partner violence:    Fear of current or ex  partner: Not on file    Emotionally abused: Not on file    Physically abused: Not on file    Forced sexual activity: Not on file  Other Topics Concern  . Not on file  Social History Narrative   Fun: Computers and playing games     Current Outpatient Medications:  .  amLODipine (NORVASC) 10 MG tablet, Take 1 tablet (10 mg total) by mouth daily., Disp: 30 tablet, Rfl: 1 .  Canagliflozin-metFORMIN HCl ER (INVOKAMET XR) [phone] MG TB24, Take 1 tablet by mouth daily., Disp: 90 tablet, Rfl: 1 .  Diclofenac Sodium (PENNSAID) 2 % SOLN, Place 1 application onto the skin 2 (two) times daily., Disp: 1 Bottle, Rfl: 3 .  Dulaglutide (TRULICITY) 1.5 VO/1.6WV SOPN, Inject 1.5 mg into  the skin once a week., Disp: 2 mL, Rfl: 5 .  glucose blood (ONE TOUCH ULTRA TEST) test strip, Use as instructed, Disp: 100 each, Rfl: 12 .  lisinopril (PRINIVIL,ZESTRIL) 40 MG tablet, Take 1 tablet (40 mg total) by mouth daily., Disp: 90 tablet, Rfl: 3 .  Misc Natural Products (OSTEO BI-FLEX ADV DOUBLE ST PO), Take by mouth., Disp: , Rfl:   No Known Allergies   ROS See HPI  Objective  Vitals:   11/08/17 0903  BP: 140/80  Pulse: 87  Resp: 16  Temp: 98.1 F (36.7 C)  TempSrc: Oral  SpO2: 97%  Weight: 142 lb (64.4 kg)  Height: 5\' 5"  (1.651 m)    Body mass index is 23.63 kg/m.  Physical Exam Vital signs reviewed. Constitutional: Patient appears well-developed and well-nourished. No distress.  HENT: Head: Normocephalic and atraumatic. Nose: Nose normal. Mouth/Throat: Oropharynx is clear and moist.  Eyes: Conjunctivae are normal. No scleral icterus.  Neck: Normal range of motion. Neck supple.  Cardiovascular: Normal rate, regular rhythm and normal heart sounds. No BLE edema. Distal pulses intact. Pulmonary/Chest: Effort normal and breath sounds normal. No respiratory distress. Musculoskeletal: Normal range of motion.No gross deformities Neurological:He is alert and oriented to person, place, and time.Coordination, balance, strength, speech and gait are normal.  Skin: Skin is warm and dry. No rash noted. No erythema.  Psychiatric: Patient has a normal mood and affect. behavior is normal. Judgment and thought content normal.   Recent Results (from the past 2160 hour(s))  POCT HgB A1C     Status: Abnormal   Collection Time: 10/04/17 12:33 PM  Result Value Ref Range   Hemoglobin A1C 6.1 (A) 4.0 - 5.6 %   HbA1c, POC (prediabetic range)  5.7 - 6.4 %   HbA1c, POC (controlled diabetic range)  0.0 - 7.0 %    Diabetic Foot Exam: Diabetic Foot Exam - Simple   Simple Foot Form Visual Inspection No deformities, no ulcerations, no other skin breakdown bilaterally:   Yes Sensation Testing Intact to touch and monofilament testing bilaterally:  Yes Pulse Check Posterior Tibialis and Dorsalis pulse intact bilaterally:  Yes Comments    Assessment & Plan RTC in 3 months for CPE, DM, HTN follow up-recheck A1c, BP  -Reviewed Health Maintenance:  - HM DIABETES FOOT EXAM; Future- We discussed routine foot inspection at home, wearing supportive shoes, and avoiding going barefoot.

## 2018-02-10 ENCOUNTER — Other Ambulatory Visit (INDEPENDENT_AMBULATORY_CARE_PROVIDER_SITE_OTHER): Payer: BLUE CROSS/BLUE SHIELD

## 2018-02-10 ENCOUNTER — Encounter: Payer: Self-pay | Admitting: Nurse Practitioner

## 2018-02-10 ENCOUNTER — Ambulatory Visit (INDEPENDENT_AMBULATORY_CARE_PROVIDER_SITE_OTHER): Payer: BLUE CROSS/BLUE SHIELD | Admitting: Nurse Practitioner

## 2018-02-10 VITALS — BP 126/80 | HR 88 | Ht 65.0 in | Wt 141.0 lb

## 2018-02-10 DIAGNOSIS — Z13220 Encounter for screening for lipoid disorders: Secondary | ICD-10-CM

## 2018-02-10 DIAGNOSIS — Z125 Encounter for screening for malignant neoplasm of prostate: Secondary | ICD-10-CM

## 2018-02-10 DIAGNOSIS — E1140 Type 2 diabetes mellitus with diabetic neuropathy, unspecified: Secondary | ICD-10-CM

## 2018-02-10 DIAGNOSIS — I10 Essential (primary) hypertension: Secondary | ICD-10-CM

## 2018-02-10 DIAGNOSIS — Z0000 Encounter for general adult medical examination without abnormal findings: Secondary | ICD-10-CM

## 2018-02-10 DIAGNOSIS — Z79899 Other long term (current) drug therapy: Secondary | ICD-10-CM

## 2018-02-10 DIAGNOSIS — Z114 Encounter for screening for human immunodeficiency virus [HIV]: Secondary | ICD-10-CM

## 2018-02-10 LAB — COMPREHENSIVE METABOLIC PANEL
ALT: 16 U/L (ref 0–53)
AST: 14 U/L (ref 0–37)
Albumin: 4.5 g/dL (ref 3.5–5.2)
Alkaline Phosphatase: 77 U/L (ref 39–117)
BILIRUBIN TOTAL: 0.4 mg/dL (ref 0.2–1.2)
BUN: 15 mg/dL (ref 6–23)
CHLORIDE: 101 meq/L (ref 96–112)
CO2: 27 meq/L (ref 19–32)
CREATININE: 0.6 mg/dL (ref 0.40–1.50)
Calcium: 9.4 mg/dL (ref 8.4–10.5)
GFR: 144.7 mL/min (ref >60.00)
GLUCOSE: 105 mg/dL — AB (ref 70–99)
Potassium: 3.9 mEq/L (ref 3.5–5.1)
SODIUM: 138 meq/L (ref 135–145)
Total Protein: 7.5 g/dL (ref 6.0–8.3)

## 2018-02-10 LAB — CBC
HCT: 44.5 % (ref 39.0–52.0)
HEMOGLOBIN: 14.9 g/dL (ref 13.0–17.0)
MCHC: 33.4 g/dL (ref 30.0–36.0)
MCV: 90.7 fl (ref 78.0–100.0)
Platelets: 302 10*3/uL (ref 150.0–400.0)
RBC: 4.91 Mil/uL (ref 4.22–5.81)
RDW: 12.8 % (ref 11.5–15.5)
WBC: 6.9 10*3/uL (ref 4.0–10.5)

## 2018-02-10 LAB — LIPID PANEL
CHOL/HDL RATIO: 3
Cholesterol: 129 mg/dL (ref 0–200)
HDL: 43.9 mg/dL (ref >39.00)
LDL CALC: 63 mg/dL (ref 0–99)
NonHDL: 85.22
Triglycerides: 113 mg/dL (ref 0.0–149.0)
VLDL: 22.6 mg/dL (ref 0.0–40.0)

## 2018-02-10 LAB — PSA: PSA: 0.99 ng/mL (ref 0.10–4.00)

## 2018-02-10 LAB — VITAMIN B12: VITAMIN B 12: 175 pg/mL — AB (ref 211–911)

## 2018-02-10 LAB — HEMOGLOBIN A1C: HEMOGLOBIN A1C: 7.4 % — AB (ref 4.6–6.5)

## 2018-02-10 MED ORDER — AMLODIPINE BESYLATE 10 MG PO TABS: 10.0000 mg | ORAL_TABLET | Freq: Every day | ORAL | 1 refills | Status: DC

## 2018-02-10 MED ORDER — DULAGLUTIDE 1.5 MG/0.5ML ~~LOC~~ SOAJ: 1.5000 mg | SUBCUTANEOUS | 5 refills | Status: DC

## 2018-02-10 MED ORDER — LISINOPRIL 40 MG PO TABS: 40.0000 mg | ORAL_TABLET | Freq: Every day | ORAL | 3 refills | Status: DC

## 2018-02-10 MED ORDER — CANAGLIFLOZIN-METFORMIN HCL ER 150-1000 MG PO TB24: 1.0000 | ORAL_TABLET | Freq: Every day | ORAL | 1 refills | Status: DC

## 2018-02-10 NOTE — Patient Instructions (Signed)
Head downstairs for labwork  Good to see you!   Health Maintenance, Male A healthy lifestyle and preventive care is important for your health and wellness. Ask your health care provider about what schedule of regular examinations is right for you. What should I know about weight and diet? Eat a Healthy Diet  Eat plenty of vegetables, fruits, whole grains, low-fat dairy products, and lean protein.  Do not eat a lot of foods high in solid fats, added sugars, or salt.  Maintain a Healthy Weight Regular exercise can help you achieve or maintain a healthy weight. You should:  Do at least 150 minutes of exercise each week. The exercise should increase your heart rate and make you sweat (moderate-intensity exercise).  Do strength-training exercises at least twice a week.  Watch Your Levels of Cholesterol and Blood Lipids  Have your blood tested for lipids and cholesterol every 5 years starting at 63 years of age. If you are at high risk for heart disease, you should start having your blood tested when you are 63 years old. You may need to have your cholesterol levels checked more often if: ? Your lipid or cholesterol levels are high. ? You are older than 63 years of age. ? You are at high risk for heart disease.  What should I know about cancer screening? Many types of cancers can be detected early and may often be prevented. Lung Cancer  You should be screened every year for lung cancer if: ? You are a current smoker who has smoked for at least 30 years. ? You are a former smoker who has quit within the past 15 years.  Talk to your health care provider about your screening options, when you should start screening, and how often you should be screened.  Colorectal Cancer  Routine colorectal cancer screening usually begins at 63 years of age and should be repeated every 5-10 years until you are 63 years old. You may need to be screened more often if early forms of precancerous polyps  or small growths are found. Your health care provider may recommend screening at an earlier age if you have risk factors for colon cancer.  Your health care provider may recommend using home test kits to check for hidden blood in the stool.  A small camera at the end of a tube can be used to examine your colon (sigmoidoscopy or colonoscopy). This checks for the earliest forms of colorectal cancer.  Prostate and Testicular Cancer  Depending on your age and overall health, your health care provider may do certain tests to screen for prostate and testicular cancer.  Talk to your health care provider about any symptoms or concerns you have about testicular or prostate cancer.  Skin Cancer  Check your skin from head to toe regularly.  Tell your health care provider about any new moles or changes in moles, especially if: ? There is a change in a mole's size, shape, or color. ? You have a mole that is larger than a pencil eraser.  Always use sunscreen. Apply sunscreen liberally and repeat throughout the day.  Protect yourself by wearing long sleeves, pants, a wide-brimmed hat, and sunglasses when outside.  What should I know about heart disease, diabetes, and high blood pressure?  If you are 38-93 years of age, have your blood pressure checked every 3-5 years. If you are 2 years of age or older, have your blood pressure checked every year. You should have your blood pressure measured twice-once  when you are at a hospital or clinic, and once when you are not at a hospital or clinic. Record the average of the two measurements. To check your blood pressure when you are not at a hospital or clinic, you can use: ? An automated blood pressure machine at a pharmacy. ? A home blood pressure monitor.  Talk to your health care provider about your target blood pressure.  If you are between 26-85 years old, ask your health care provider if you should take aspirin to prevent heart disease.  Have  regular diabetes screenings by checking your fasting blood sugar level. ? If you are at a normal weight and have a low risk for diabetes, have this test once every three years after the age of 18. ? If you are overweight and have a high risk for diabetes, consider being tested at a younger age or more often.  A one-time screening for abdominal aortic aneurysm (AAA) by ultrasound is recommended for men aged 72-75 years who are current or former smokers. What should I know about preventing infection? Hepatitis B If you have a higher risk for hepatitis B, you should be screened for this virus. Talk with your health care provider to find out if you are at risk for hepatitis B infection. Hepatitis C Blood testing is recommended for:  Everyone born from [birth year] through 1966.  Anyone with known risk factors for hepatitis C.  Sexually Transmitted Diseases (STDs)  You should be screened each year for STDs including gonorrhea and chlamydia if: ? You are sexually active and are younger than 63 years of age. ? You are older than 63 years of age and your health care provider tells you that you are at risk for this type of infection. ? Your sexual activity has changed since you were last screened and you are at an increased risk for chlamydia or gonorrhea. Ask your health care provider if you are at risk.  Talk with your health care provider about whether you are at high risk of being infected with HIV. Your health care provider may recommend a prescription medicine to help prevent HIV infection.  What else can I do?  Schedule regular health, dental, and eye exams.  Stay current with your vaccines (immunizations).  Do not use any tobacco products, such as cigarettes, chewing tobacco, and e-cigarettes. If you need help quitting, ask your health care provider.  Limit alcohol intake to no more than 2 drinks per day. One drink equals 12 ounces of beer, 5 ounces of wine, or 1 ounces of hard liquor.  Do  not use street drugs.  Do not share needles.  Ask your health care provider for help if you need support or information about quitting drugs.  Tell your health care provider if you often feel depressed.  Tell your health care provider if you have ever been abused or do not feel safe at home. This information is not intended to replace advice given to you by your health care provider. Make sure you discuss any questions you have with your health care provider. Document Released: 10/16/2007 Document Revised: 12/17/2015 Document Reviewed: 01/21/2015 Elsevier Interactive Patient Education  [employer].

## 2018-02-10 NOTE — Assessment & Plan Note (Signed)
Stable Continue current meds Continue to monitor - amLODipine (NORVASC) 10 MG tablet; Take 1 tablet (10 mg total) by mouth daily.  Dispense: 30 tablet; Refill: 1 - lisinopril (PRINIVIL,ZESTRIL) 40 MG tablet; Take 1 tablet (40 mg total) by mouth daily.  Dispense: 90 tablet; Refill: 3 - CBC; Future - Lipid panel; Future - Comprehensive metabolic panel; Future

## 2018-02-10 NOTE — Assessment & Plan Note (Signed)
Continue current meds Update labs He is not on statin, will see if he is willing to start when labs return - Canagliflozin-metFORMIN HCl ER (INVOKAMET XR) [phone] MG TB24; Take 1 tablet by mouth daily.  Dispense: 90 tablet; Refill: 1 - Dulaglutide (TRULICITY) 1.5 MV/6.7MC SOPN; Inject 1.5 mg into the skin once a week.  Dispense: 2 mL; Refill: 5 - lisinopril (PRINIVIL,ZESTRIL) 40 MG tablet; Take 1 tablet (40 mg total) by mouth daily.  Dispense: 90 tablet; Refill: 3 - CBC; Future - Hemoglobin A1c; Future - Lipid panel; Future - Comprehensive metabolic panel; Future

## 2018-02-10 NOTE — Progress Notes (Signed)
Edward [name] is a 62 y.o. male with the following history as recorded in [employer]:  Patient Active Problem List   Diagnosis Date Noted  . Scapular dysfunction 05/26/2017  . Left knee pain 02/18/2016  . Type 2 diabetes mellitus ([place]) 08/28/2015  . Essential hypertension 08/28/2015  . Routine general medical examination at a health care facility 08/28/2015    Current Outpatient Medications  Medication Sig Dispense Refill  . amLODipine (NORVASC) 10 MG tablet Take 1 tablet (10 mg total) by mouth daily. 30 tablet 1  . Canagliflozin-metFORMIN HCl ER (INVOKAMET XR) [phone] MG TB24 Take 1 tablet by mouth daily. 90 tablet 1  . Dulaglutide (TRULICITY) 1.5 FB/5.1WC SOPN Inject 1.5 mg into the skin once a week. 2 mL 5  . glucose blood (ONE TOUCH ULTRA TEST) test strip Use as instructed 100 each 12  . lisinopril (PRINIVIL,ZESTRIL) 40 MG tablet Take 1 tablet (40 mg total) by mouth daily. 90 tablet 3  . Misc Natural Products (OSTEO BI-FLEX ADV DOUBLE ST PO) Take by mouth.     No current facility-administered medications for this visit.     Allergies: Patient has no known allergies.  Past Medical History:  Diagnosis Date  . Diabetes mellitus without complication ([place])   . Hypertension     Past Surgical History:  Procedure Laterality Date  . TUMOR REMOVAL      Family History  Problem Relation Age of Onset  . Prostate cancer Father   . Heart attack Paternal Grandfather     Social History   Tobacco Use  . Smoking status: Never Smoker  . Smokeless tobacco: Never Used  Substance Use Topics  . Alcohol use: No    Alcohol/week: 0.0 standard drinks     Subjective:  [name] is here today for annual CPE, accompanied by [place] interpreter.  Last dental exam: 2018 Last vision exam: 2019 PSA: today Lung ca screening: n/a, never been a smoker Colonoscopy: AUG 2017- [facility] Lipids: lipid panel today Vaccinations: declines flu vacc Diet and exercise: trying to eat healthy diet, walking  sometimes   Hypertension -maintained on amlodipine 10, lisinopril 40  Reports daily medication compliance without adverse medication effects.  BP Readings from Last 3 Encounters:  02/10/18 126/80  11/08/17 140/80  10/04/17 134/80   Diabetes- maintained on invokamet [id number] daily, trulicity 1.5 weekly Reports daily medication compliance without adverse medication effects. Has not been checking sugars at home  Lab Results  Component Value Date   HGBA1C 6.1 (A) 10/04/2017     Review of Systems  Constitutional: Negative for chills and fever.  HENT: Negative for hearing loss.   Eyes: Negative for blurred vision and double vision.  Respiratory: Negative for cough and shortness of breath.   Cardiovascular: Negative for chest pain.  Gastrointestinal: Negative for blood in stool, constipation, diarrhea and heartburn.  Genitourinary: Negative for dysuria.  Musculoskeletal: Negative for falls.  Skin: Negative for rash.  Neurological: Negative for loss of consciousness and headaches.  Endo/Heme/Allergies: Does not bruise/bleed easily.  Psychiatric/Behavioral: Negative for depression. The patient is not nervous/anxious.    Objective:  Vitals:   02/10/18 0904  BP: 126/80  Pulse: 88  SpO2: 98%  Weight: 141 lb (64 kg)  Height: 5\' 5"  (1.651 m)    General: Well developed, well nourished, in no acute distress.  Skin : Warm and dry.  Head: Normocephalic and atraumatic  Eyes: Sclera and conjunctiva clear; pupils round and reactive to light; extraocular movements intact  Ears: External normal; canals  clear; tympanic membranes normal  Oropharynx: Pink, supple. No suspicious lesions  Neck: Supple without thyromegaly, adenopathy  Lungs: Respirations unlabored; clear to auscultation bilaterally without wheeze, rales, rhonchi  CVS exam: normal rate and regular rhythm, S1 and S2 normal.  Abdomen: Soft; nontender; nondistended; normoactive bowel sounds; no masses or hepatosplenomegaly   Musculoskeletal: No deformities; no active joint inflammation  Extremities: No edema, cyanosis Vessels: Symmetric bilaterally  Neurologic: Alert and oriented; speech intact; face symmetrical; moves all extremities well; CNII-XII intact without focal deficit   Physical Exam   Assessment:  1. Routine general medical examination at a health care facility   2. Essential hypertension   3. Type 2 diabetes mellitus with diabetic neuropathy, without long-term current use of insulin ([place])   4. Screening for prostate cancer   5. Screening for HIV (human immunodeficiency virus)   6. Screening for cholesterol level   7. High risk medication use     Plan:   Return in about 6 months (around 08/12/2018) for Diabetes follow up, A1c.  Orders Placed This Encounter  Procedures  . CBC    Standing Status:   Future    Standing Expiration Date:   02/11/2019  . Vitamin B12    Standing Status:   Future    Standing Expiration Date:   02/10/2019  . Hemoglobin A1c    Standing Status:   Future    Standing Expiration Date:   02/11/2019  . Lipid panel    Standing Status:   Future    Standing Expiration Date:   02/11/2019  . PSA    Standing Status:   Future    Standing Expiration Date:   02/11/2019  . HIV Antibody (routine testing w rflx)    Standing Status:   Future    Standing Expiration Date:   03/13/2018  . Comprehensive metabolic panel    Standing Status:   Future    Standing Expiration Date:   02/11/2019    Requested Prescriptions   Signed Prescriptions Disp Refills  . amLODipine (NORVASC) 10 MG tablet 30 tablet 1    Sig: Take 1 tablet (10 mg total) by mouth daily.  . Canagliflozin-metFORMIN HCl ER (INVOKAMET XR) [phone] MG TB24 90 tablet 1    Sig: Take 1 tablet by mouth daily.  . Dulaglutide (TRULICITY) 1.5 BP/1.0CH SOPN 2 mL 5    Sig: Inject 1.5 mg into the skin once a week.  Marland Kitchen lisinopril (PRINIVIL,ZESTRIL) 40 MG tablet 90 tablet 3    Sig: Take 1 tablet (40 mg total) by mouth daily.

## 2018-02-10 NOTE — Assessment & Plan Note (Signed)
Routine general medical examination at a health care facility Reviewed annual screening exams, healthy lifestyle,  additional information provided on AVS - CBC; Future - Vitamin B12; Future - Hemoglobin A1c; Future - Lipid panel; Future - PSA; Future - HIV Antibody (routine testing w rflx); Future - Comprehensive metabolic panel; Future  Screening for prostate cancer- PSA; Future  Screening for HIV (human immunodeficiency virus)- HIV Antibody (routine testing w rflx); Future  Screening for cholesterol level He is fasting - Lipid panel; Future  High risk medication use - CBC; Future - Vitamin B12; Future - Comprehensive metabolic panel; Future

## 2018-02-11 ENCOUNTER — Other Ambulatory Visit: Payer: Self-pay | Admitting: Nurse Practitioner

## 2018-02-11 DIAGNOSIS — I10 Essential (primary) hypertension: Secondary | ICD-10-CM

## 2018-02-11 LAB — HIV ANTIBODY (ROUTINE TESTING W REFLEX): HIV 1&2 Ab, 4th Generation: NONREACTIVE

## 2018-02-16 ENCOUNTER — Other Ambulatory Visit: Payer: Self-pay | Admitting: Nurse Practitioner

## 2018-02-16 DIAGNOSIS — E538 Deficiency of other specified B group vitamins: Secondary | ICD-10-CM

## 2018-02-24 ENCOUNTER — Encounter: Payer: Self-pay | Admitting: *Deleted

## 2018-04-08 ENCOUNTER — Other Ambulatory Visit: Payer: Self-pay | Admitting: Nurse Practitioner

## 2018-04-08 DIAGNOSIS — I10 Essential (primary) hypertension: Secondary | ICD-10-CM

## 2018-04-12 ENCOUNTER — Other Ambulatory Visit (INDEPENDENT_AMBULATORY_CARE_PROVIDER_SITE_OTHER): Payer: BLUE CROSS/BLUE SHIELD

## 2018-04-12 ENCOUNTER — Encounter: Payer: Self-pay | Admitting: Nurse Practitioner

## 2018-04-12 ENCOUNTER — Ambulatory Visit: Payer: BLUE CROSS/BLUE SHIELD | Admitting: Nurse Practitioner

## 2018-04-12 VITALS — BP 138/82 | HR 91 | Ht 65.0 in | Wt 144.0 lb

## 2018-04-12 DIAGNOSIS — E538 Deficiency of other specified B group vitamins: Secondary | ICD-10-CM | POA: Diagnosis not present

## 2018-04-12 DIAGNOSIS — I10 Essential (primary) hypertension: Secondary | ICD-10-CM

## 2018-04-12 DIAGNOSIS — E1140 Type 2 diabetes mellitus with diabetic neuropathy, unspecified: Secondary | ICD-10-CM

## 2018-04-12 LAB — POCT GLYCOSYLATED HEMOGLOBIN (HGB A1C): HEMOGLOBIN A1C: 9.3 % — AB (ref 4.0–5.6)

## 2018-04-12 LAB — HEMOGLOBIN A1C: Hgb A1c MFr Bld: 9.2 % — ABNORMAL HIGH (ref 4.6–6.5)

## 2018-04-12 MED ORDER — CANAGLIFLOZIN-METFORMIN HCL ER 150-1000 MG PO TB24: 1.0000 | ORAL_TABLET | Freq: Every day | ORAL | 1 refills | Status: DC

## 2018-04-12 MED ORDER — DULAGLUTIDE 1.5 MG/0.5ML ~~LOC~~ SOAJ: 1.5000 mg | SUBCUTANEOUS | 5 refills | Status: DC

## 2018-04-12 MED ORDER — GLUCOSE BLOOD VI STRP: ORAL_STRIP | 12 refills | Status: DC

## 2018-04-12 MED ORDER — AMLODIPINE BESYLATE 10 MG PO TABS: ORAL_TABLET | ORAL | 3 refills | Status: DC

## 2018-04-12 MED ORDER — LISINOPRIL 40 MG PO TABS: 40.0000 mg | ORAL_TABLET | Freq: Every day | ORAL | 3 refills | Status: DC

## 2018-04-12 NOTE — Patient Instructions (Signed)
Head downstairs for lab work today  [place] let you know when I get your lab work back   Diabetes Mellitus and Nutrition When you have diabetes (diabetes mellitus), it is very important to have healthy eating habits because your blood sugar (glucose) levels are greatly affected by what you eat and drink. Eating healthy foods in the appropriate amounts, at about the same times every day, can help you:  Control your blood glucose.  Lower your risk of heart disease.  Improve your blood pressure.  Reach or maintain a healthy weight.  Every person with diabetes is different, and each person has different needs for a meal plan. Your health care provider may recommend that you work with a diet and nutrition specialist (dietitian) to make a meal plan that is best for you. Your meal plan may vary depending on factors such as:  The calories you need.  The medicines you take.  Your weight.  Your blood glucose, blood pressure, and cholesterol levels.  Your activity level.  Other health conditions you have, such as heart or kidney disease.  How do carbohydrates affect me? Carbohydrates affect your blood glucose level more than any other type of food. Eating carbohydrates naturally increases the amount of glucose in your blood. Carbohydrate counting is a method for keeping track of how many carbohydrates you eat. Counting carbohydrates is important to keep your blood glucose at a healthy level, especially if you use insulin or take certain oral diabetes medicines. It is important to know how many carbohydrates you can safely have in each meal. This is different for every person. Your dietitian can help you calculate how many carbohydrates you should have at each meal and for snack. Foods that contain carbohydrates include:  Bread, cereal, rice, pasta, and crackers.  Potatoes and corn.  Peas, beans, and lentils.  Milk and yogurt.  Fruit and juice.  Desserts, such as cakes, cookies, ice  cream, and candy.  How does alcohol affect me? Alcohol can cause a sudden decrease in blood glucose (hypoglycemia), especially if you use insulin or take certain oral diabetes medicines. Hypoglycemia can be a life-threatening condition. Symptoms of hypoglycemia (sleepiness, dizziness, and confusion) are similar to symptoms of having too much alcohol. If your health care provider says that alcohol is safe for you, follow these guidelines:  Limit alcohol intake to no more than 1 drink per day for nonpregnant women and 2 drinks per day for men. One drink equals 12 oz of beer, 5 oz of wine, or 1 oz of hard liquor.  Do not drink on an empty stomach.  Keep yourself hydrated with water, diet soda, or unsweetened iced tea.  Keep in mind that regular soda, juice, and other mixers may contain a lot of sugar and must be counted as carbohydrates.  What are tips for following this plan? Reading food labels  Start by checking the serving size on the label. The amount of calories, carbohydrates, fats, and other nutrients listed on the label are based on one serving of the food. Many foods contain more than one serving per package.  Check the total grams (g) of carbohydrates in one serving. You can calculate the number of servings of carbohydrates in one serving by dividing the total carbohydrates by 15. For example, if a food has 30 g of total carbohydrates, it would be equal to 2 servings of carbohydrates.  Check the number of grams (g) of saturated and trans fats in one serving. Choose foods that have low  or no amount of these fats.  Check the number of milligrams (mg) of sodium in one serving. Most people should limit total sodium intake to less than 2,300 mg per day.  Always check the nutrition information of foods labeled as "low-fat" or "nonfat". These foods may be higher in added sugar or refined carbohydrates and should be avoided.  Talk to your dietitian to identify your daily goals for  nutrients listed on the label. Shopping  Avoid buying canned, premade, or processed foods. These foods tend to be high in fat, sodium, and added sugar.  Shop around the outside edge of the grocery store. This includes fresh fruits and vegetables, bulk grains, fresh meats, and fresh dairy. Cooking  Use low-heat cooking methods, such as baking, instead of high-heat cooking methods like deep frying.  Cook using healthy oils, such as olive, canola, or sunflower oil.  Avoid cooking with butter, cream, or high-fat meats. Meal planning  Eat meals and snacks regularly, preferably at the same times every day. Avoid going long periods of time without eating.  Eat foods high in fiber, such as fresh fruits, vegetables, beans, and whole grains. Talk to your dietitian about how many servings of carbohydrates you can eat at each meal.  Eat 4-6 ounces of lean protein each day, such as lean meat, chicken, fish, eggs, or tofu. 1 ounce is equal to 1 ounce of meat, chicken, or fish, 1 egg, or 1/4 cup of tofu.  Eat some foods each day that contain healthy fats, such as avocado, nuts, seeds, and fish. Lifestyle   Check your blood glucose regularly.  Exercise at least 30 minutes 5 or more days each week, or as told by your health care provider.  Take medicines as told by your health care provider.  Do not use any products that contain nicotine or tobacco, such as cigarettes and e-cigarettes. If you need help quitting, ask your health care provider.  Work with a Social worker or diabetes educator to identify strategies to manage stress and any emotional and social challenges. What are some questions to ask my health care provider?  Do I need to meet with a diabetes educator?  Do I need to meet with a dietitian?  What number can I call if I have questions?  When are the best times to check my blood glucose? Where to find more information:  American Diabetes Association:  diabetes.org/food-and-fitness/food  Academy of Nutrition and Dietetics: PokerClues.dk  [employer] of Diabetes and Digestive and Kidney Diseases (NIH): ContactWire.be Summary  A healthy meal plan will help you control your blood glucose and maintain a healthy lifestyle.  Working with a diet and nutrition specialist (dietitian) can help you make a meal plan that is best for you.  Keep in mind that carbohydrates and alcohol have immediate effects on your blood glucose levels. It is important to count carbohydrates and to use alcohol carefully. This information is not intended to replace advice given to you by your health care provider. Make sure you discuss any questions you have with your health care provider. Document Released: 01/14/2005 Document Revised: 05/24/2016 Document Reviewed: 05/24/2016 Elsevier Interactive Patient Education  [employer].

## 2018-04-12 NOTE — Addendum Note (Signed)
Addended by: Cresenciano Lick on: 04/12/2018 09:35 AM   Modules accepted: Orders

## 2018-04-12 NOTE — Assessment & Plan Note (Signed)
Stable Continue current medications Continue to monitor  - amLODipine (NORVASC) 10 MG tablet; TAKE 1 TABLET(10 MG) BY MOUTH DAILY  Dispense: 90 tablet; Refill: 3 - lisinopril (PRINIVIL,ZESTRIL) 40 MG tablet; Take 1 tablet (40 mg total) by mouth daily.  Dispense: 90 tablet; Refill: 3

## 2018-04-12 NOTE — Assessment & Plan Note (Signed)
He will continue OTC supplement with plan to recheck B12 in about 1 month

## 2018-04-12 NOTE — Progress Notes (Signed)
Edward [name] is a 62 y.o. male with the following history as recorded in Epic[employer]:  Patient Active Problem List   Diagnosis Date Noted  . Scapular dysfunction 05/26/2017  . Left knee pain 02/18/2016  . Type 2 diabetes mellitus ([place]) 08/28/2015  . Essential hypertension 08/28/2015  . Routine general medical examination at a health care facility 08/28/2015    Current Outpatient Medications  Medication Sig Dispense Refill  . amLODipine (NORVASC) 10 MG tablet TAKE 1 TABLET(10 MG) BY MOUTH DAILY 90 tablet 3  . Canagliflozin-metFORMIN HCl ER (INVOKAMET XR) [phone] MG TB24 Take 1 tablet by mouth daily. 90 tablet 1  . Dulaglutide (TRULICITY) 1.5 WN/4.6EV SOPN Inject 1.5 mg into the skin once a week. 2 mL 5  . glucose blood (ONE TOUCH ULTRA TEST) test strip Use as instructed 100 each 12  . lisinopril (PRINIVIL,ZESTRIL) 40 MG tablet Take 1 tablet (40 mg total) by mouth daily. 90 tablet 3  . Misc Natural Products (OSTEO BI-FLEX ADV DOUBLE ST PO) Take by mouth.     No current facility-administered medications for this visit.     Allergies: Patient has no known allergies.  Past Medical History:  Diagnosis Date  . Diabetes mellitus without complication ([place])   . Hypertension     Past Surgical History:  Procedure Laterality Date  . TUMOR REMOVAL      Family History  Problem Relation Age of Onset  . Prostate cancer Father   . Heart attack Paternal Grandfather     Social History   Tobacco Use  . Smoking status: Never Smoker  . Smokeless tobacco: Never Used  Substance Use Topics  . Alcohol use: No    Alcohol/week: 0.0 standard drinks     Subjective:  Mr Thieme is here today for follow up of diabetes. We'll also follow on low B12 levels. At his CPE on 02/10/18, he was noted to have low B12  levels and was instructed to start OTC vitamin B12 1011mcg daily and return for repeat B12 level after 4 weeks, he did not ever return for lab draw. He tells me today that he actually forgot about the B12  until this week, his daughter remembered and they bought B12 yesterday for him to begin. His A1c was also slightly elevated on 02/10/18, 7.4, he was instructed to continue invokamet and trulicity and work on healthy diet and exercise, he is back today to have a1c rechecked. He tells me that he does not regularly check glucose at home because he ran out of strips and would like refill of strips today. He says he tries to follow healthy diet and walks every day at work. He reports taking all medications daily as prescribed. He has noticed feeling a little more hungry and thirsty recently but otherwise feels well Denies tremor, diaphoresis, headaches, vision changes, chest pain, shortness of breath, edema, polyuria.  ROS- See HPI  Objective:  Vitals:   04/12/18 0909  BP: 138/82  Pulse: 91  SpO2: 98%  Weight: 144 lb (65.3 kg)  Height: 5\' 5"  (1.651 m)    General: Well developed, well nourished, in no acute distress.  Skin : Warm and dry.  Head: Normocephalic and atraumatic  Eyes: Sclera and conjunctiva clear; pupils round and reactive to light; extraocular movements intact  Oropharynx: Pink, supple. No suspicious lesions  Neck: Supple Lungs: Respirations unlabored; clear to auscultation bilaterally without wheeze, rales, rhonchi  CVS exam: normal rate and regular rhythm, S1 and S2 normal.  Extremities: No edema,  cyanosis Vessels: Symmetric bilaterally  Neurologic: Alert and oriented; speech intact; face symmetrical; moves all extremities well; CNII-XII intact without focal deficit   Assessment:  1. Type 2 diabetes mellitus with diabetic neuropathy, without long-term current use of insulin ([place])   2. Essential hypertension   3. Vitamin B12 deficiency     Plan:   No follow-ups on file.  Orders Placed This Encounter  Procedures  . Hemoglobin A1c    Standing Status:   Future    Standing Expiration Date:   04/13/2019  . POCT glycosylated hemoglobin (Hb A1C)    Requested Prescriptions    Signed Prescriptions Disp Refills  . amLODipine (NORVASC) 10 MG tablet 90 tablet 3    Sig: TAKE 1 TABLET(10 MG) BY MOUTH DAILY  . Canagliflozin-metFORMIN HCl ER (INVOKAMET XR) [phone] MG TB24 90 tablet 1    Sig: Take 1 tablet by mouth daily.  . Dulaglutide (TRULICITY) 1.5 PJ/8.2NK SOPN 2 mL 5    Sig: Inject 1.5 mg into the skin once a week.  Marland Kitchen lisinopril (PRINIVIL,ZESTRIL) 40 MG tablet 90 tablet 3    Sig: Take 1 tablet (40 mg total) by mouth daily.  Marland Kitchen glucose blood (ONE TOUCH ULTRA TEST) test strip 100 each 12    Sig: Use as instructed

## 2018-04-12 NOTE — Assessment & Plan Note (Addendum)
POCT A1c is now up 2 points, was a difficult fingerstick, will repeat A1c in lab for verification prior to medication adjustments, he Is agreeable to adjustments if needed Discussed the role of healthy diet and exercise in the management of DM, additional education on AVS - POCT glycosylated hemoglobin (Hb A1C) Lab Results  Component Value Date   HGBA1C 9.3 (A) 04/12/2018  - Canagliflozin-metFORMIN HCl ER (INVOKAMET XR) [phone] MG TB24; Take 1 tablet by mouth daily.  Dispense: 90 tablet; Refill: 1 - Dulaglutide (TRULICITY) 1.5 NH/6.5BX SOPN; Inject 1.5 mg into the skin once a week.  Dispense: 2 mL; Refill: 5 - lisinopril (PRINIVIL,ZESTRIL) 40 MG tablet; Take 1 tablet (40 mg total) by mouth daily.  Dispense: 90 tablet; Refill: 3 - Hemoglobin A1c; Future

## 2018-04-14 ENCOUNTER — Other Ambulatory Visit: Payer: Self-pay | Admitting: Nurse Practitioner

## 2018-04-14 DIAGNOSIS — E1140 Type 2 diabetes mellitus with diabetic neuropathy, unspecified: Secondary | ICD-10-CM

## 2018-04-14 DIAGNOSIS — E538 Deficiency of other specified B group vitamins: Secondary | ICD-10-CM

## 2018-04-14 MED ORDER — CANAGLIFLOZIN-METFORMIN HCL ER 150-1000 MG PO TB24: 2.0000 | ORAL_TABLET | Freq: Every day | ORAL | 1 refills | Status: DC

## 2018-04-17 ENCOUNTER — Encounter: Payer: Self-pay | Admitting: *Deleted

## 2018-08-16 ENCOUNTER — Ambulatory Visit: Payer: BLUE CROSS/BLUE SHIELD | Admitting: Nurse Practitioner

## 2018-10-07 ENCOUNTER — Other Ambulatory Visit: Payer: Self-pay | Admitting: *Deleted

## 2018-10-07 DIAGNOSIS — Z20822 Contact with and (suspected) exposure to covid-19: Secondary | ICD-10-CM

## 2018-10-09 LAB — NOVEL CORONAVIRUS, NAA: SARS-CoV-2, NAA: NOT DETECTED

## 2018-10-10 ENCOUNTER — Telehealth: Payer: Self-pay | Admitting: Nurse Practitioner

## 2018-10-10 NOTE — Telephone Encounter (Signed)
Pt's daughter Given results, verbalizes understanding.

## 2018-10-11 ENCOUNTER — Other Ambulatory Visit: Payer: Self-pay | Admitting: *Deleted

## 2018-10-11 DIAGNOSIS — E1140 Type 2 diabetes mellitus with diabetic neuropathy, unspecified: Secondary | ICD-10-CM

## 2018-10-11 MED ORDER — CANAGLIFLOZIN-METFORMIN HCL ER 150-1000 MG PO TB24: 2.0000 | ORAL_TABLET | Freq: Every day | ORAL | 0 refills | Status: DC

## 2018-11-13 ENCOUNTER — Other Ambulatory Visit: Payer: Self-pay | Admitting: Family

## 2018-11-13 DIAGNOSIS — E1140 Type 2 diabetes mellitus with diabetic neuropathy, unspecified: Secondary | ICD-10-CM

## 2018-11-13 MED ORDER — TRULICITY 1.5 MG/0.5ML ~~LOC~~ SOAJ: 1.5000 mg | SUBCUTANEOUS | 0 refills | Status: DC

## 2018-11-30 DIAGNOSIS — E079 Disorder of thyroid, unspecified: Secondary | ICD-10-CM | POA: Insufficient documentation

## 2019-02-15 DIAGNOSIS — E042 Nontoxic multinodular goiter: Secondary | ICD-10-CM | POA: Insufficient documentation

## 2019-11-23 ENCOUNTER — Other Ambulatory Visit: Payer: Self-pay

## 2019-11-23 ENCOUNTER — Ambulatory Visit (INDEPENDENT_AMBULATORY_CARE_PROVIDER_SITE_OTHER): Payer: 59 | Admitting: Registered Nurse

## 2019-11-23 ENCOUNTER — Encounter: Payer: Self-pay | Admitting: Registered Nurse

## 2019-11-23 VITALS — BP 147/81 | HR 87 | Temp 98.2°F | Resp 18 | Ht 65.0 in

## 2019-11-23 DIAGNOSIS — E1140 Type 2 diabetes mellitus with diabetic neuropathy, unspecified: Secondary | ICD-10-CM

## 2019-11-23 LAB — POCT GLYCOSYLATED HEMOGLOBIN (HGB A1C): Hemoglobin A1C: 9.1 % — AB (ref 4.0–5.6)

## 2019-11-23 NOTE — Patient Instructions (Signed)
° ° ° °  If you have lab work done today you will be contacted with your lab results within the next 2 weeks.  If you have not heard from us then please contact us. The fastest way to get your results is to register for My Chart. ° ° °IF you received an x-ray today, you will receive an invoice from [facility]. Please contact [facility] at [phone] with questions or concerns regarding your invoice.  ° °IF you received labwork today, you will receive an invoice from [facility]. Please contact [facility] at [phone] with questions or concerns regarding your invoice.  ° °Our billing staff will not be able to assist you with questions regarding bills from these companies. ° °You will be contacted with the lab results as soon as they are available. The fastest way to get your results is to activate your My Chart account. Instructions are located on the last page of this paperwork. If you have not heard from us regarding the results in 2 weeks, please contact this office. °  ° ° ° °

## 2019-11-24 LAB — COMPREHENSIVE METABOLIC PANEL
ALT: 33 IU/L (ref 0–44)
AST: 22 IU/L (ref 0–40)
Albumin/Globulin Ratio: 1.5 (ref 1.2–2.2)
Albumin: 4.4 g/dL (ref 3.8–4.8)
Alkaline Phosphatase: 96 IU/L (ref 48–121)
BUN/Creatinine Ratio: 21 (ref 10–24)
BUN: 15 mg/dL (ref 8–27)
Bilirubin Total: 0.5 mg/dL (ref 0.0–1.2)
CO2: 22 mmol/L (ref 20–29)
Calcium: 9.4 mg/dL (ref 8.6–10.2)
Chloride: 97 mmol/L (ref 96–106)
Creatinine, Ser: 0.71 mg/dL — ABNORMAL LOW (ref 0.76–1.27)
GFR calc Af Amer: 115 mL/min/{1.73_m2} (ref >59)
GFR calc non Af Amer: 99 mL/min/{1.73_m2} (ref >59)
Globulin, Total: 3 g/dL (ref 1.5–4.5)
Glucose: 186 mg/dL — ABNORMAL HIGH (ref 65–99)
Potassium: 4.1 mmol/L (ref 3.5–5.2)
Sodium: 137 mmol/L (ref 134–144)
Total Protein: 7.4 g/dL (ref 6.0–8.5)

## 2019-11-24 LAB — CBC WITH DIFFERENTIAL/PLATELET
Basophils Absolute: 0.1 10*3/uL (ref 0.0–0.2)
Basos: 1 %
EOS (ABSOLUTE): 0.2 10*3/uL (ref 0.0–0.4)
Eos: 3 %
Hematocrit: 46.4 % (ref 37.5–51.0)
Hemoglobin: 15 g/dL (ref 13.0–17.7)
Immature Grans (Abs): 0 10*3/uL (ref 0.0–0.1)
Immature Granulocytes: 0 %
Lymphocytes Absolute: 2.7 10*3/uL (ref 0.7–3.1)
Lymphs: 32 %
MCH: 29.1 pg (ref 26.6–33.0)
MCHC: 32.3 g/dL (ref 31.5–35.7)
MCV: 90 fL (ref 79–97)
Monocytes Absolute: 0.8 10*3/uL (ref 0.1–0.9)
Monocytes: 9 %
Neutrophils Absolute: 4.6 10*3/uL (ref 1.4–7.0)
Neutrophils: 55 %
Platelets: 262 10*3/uL (ref 150–450)
RBC: 5.16 x10E6/uL (ref 4.14–5.80)
RDW: 12.1 % (ref 11.6–15.4)
WBC: 8.4 10*3/uL (ref 3.4–10.8)

## 2019-11-24 LAB — TSH: TSH: 0.898 u[IU]/mL (ref 0.450–4.500)

## 2019-11-24 LAB — LIPID PANEL
Chol/HDL Ratio: 4.8 ratio (ref 0.0–5.0)
Cholesterol, Total: 201 mg/dL — ABNORMAL HIGH (ref 100–199)
HDL: 42 mg/dL (ref >39)
LDL Chol Calc (NIH): 111 mg/dL — ABNORMAL HIGH (ref 0–99)
Triglycerides: 279 mg/dL — ABNORMAL HIGH (ref 0–149)
VLDL Cholesterol Cal: 48 mg/dL — ABNORMAL HIGH (ref 5–40)

## 2019-11-25 ENCOUNTER — Encounter: Payer: Self-pay | Admitting: Registered Nurse

## 2020-01-11 ENCOUNTER — Other Ambulatory Visit: Payer: Self-pay | Admitting: Registered Nurse

## 2020-01-11 ENCOUNTER — Telehealth: Payer: Self-pay | Admitting: Registered Nurse

## 2020-01-11 DIAGNOSIS — E1140 Type 2 diabetes mellitus with diabetic neuropathy, unspecified: Secondary | ICD-10-CM

## 2020-01-11 DIAGNOSIS — I10 Essential (primary) hypertension: Secondary | ICD-10-CM

## 2020-01-11 MED ORDER — LISINOPRIL 40 MG PO TABS: 40.0000 mg | ORAL_TABLET | Freq: Every day | ORAL | 3 refills | Status: DC

## 2020-01-11 NOTE — Telephone Encounter (Signed)
Sent  Thanks  Kathrin Ruddy, NP

## 2020-01-11 NOTE — Telephone Encounter (Signed)
Patient daughter is calling because she states that her dad needs a medication refill , and the prescriptions were never sent to the pharmacy.

## 2020-01-11 NOTE — Telephone Encounter (Signed)
What is the name of the medication? isinopril (PRINIVIL,ZESTRIL) 40 MG tablet [[id number]], amLODipine (NORVASC) 10 MG tablet [[id number]], Canagliflozin-metFORMIN HCl ER (INVOKAMET XR) [phone] MG TB24 [[id number]], Dulaglutide (TRULICITY) 1.5 KV/4.2VZ SOPN [[id number],   Have you contacted your pharmacy to request a refill? Yes there are no scripts, his daughter is saying Orland Mustard was going to write these for her dad. I informed her that Orland Mustard had never written any of these scripts.   Which pharmacy would you like this sent to? Pharmacy  [facility] [phone] [place], [place] - [place] [address]  [place], [place] [address]  Phone:  [phone] Fax:  [phone]  DEA #:  [id number]      Patient notified that their request is being sent to the clinical staff for review and that they should receive a call once it is complete. If they do not receive a call within 72 hours they can check with their pharmacy or our office.

## 2020-01-29 ENCOUNTER — Encounter: Payer: Self-pay | Admitting: Registered Nurse

## 2020-01-29 NOTE — Progress Notes (Signed)
New Patient Office Visit  Subjective:  Patient ID: Edward [name], male    DOB: 1955/09/20  Age: 64 y.o. MRN: [id number]  CC:  Chief Complaint  Patient presents with   New Patient (Initial Visit)    Establish care .patient is having some pain and numbness in left foot for one week. Patient states he has used some medicated oinment no relief.    HPI Edward [name] presents for visit to est care  C/o numbness and tingling to L foot. Ongoing for a little over a week. Has not happened before. Stable, not worsening. No swelling. No pain in joints. No redness.  Notes hx of htn and t2dm.   T2dm: on invokamet 150-1000mg  [id number]$$[id number]$ PO qd, trulicity 1.5mg /0.61mL inj once weekly HTN: on lisinopril 40mg  PO and amlodipine 10mg  PO qd  Tolerates meds well. Does not need refills. States conditions are under control.  BP today only mildly elevated, denies CV symptoms.   Past Medical History:  Diagnosis Date   Diabetes mellitus without complication ([place])    Hypertension     Past Surgical History:  Procedure Laterality Date   TUMOR REMOVAL      Family History  Problem Relation Age of Onset   Prostate cancer Father    Heart attack Paternal Grandfather     Social History   Socioeconomic History   Marital status: Married    Spouse name: Not on file   Number of children: 2   Years of education: 16   Highest education level: Not on file  Occupational History   Occupation: [address]  Tobacco Use   Smoking status: Never Smoker   Smokeless tobacco: Never Used  Substance and Sexual Activity   Alcohol use: No    Alcohol/week: 0.0 standard drinks   Drug use: No   Sexual activity: Not on file  Other Topics Concern   Not on file  Social History Narrative   Fun: Computers and playing games   Social Determinants of Health   Financial Resource Strain:    Difficulty of Paying Living Expenses: Not on file  Food Insecurity:    Worried About Loss adjuster, chartered in the Last Year:  Not on file   Charity fundraiser of Food in the Last Year: Not on file  Transportation Needs:    Lack of Transportation (Medical): Not on file   Lack of Transportation (Non-Medical): Not on file  Physical Activity:    Days of Exercise per Week: Not on file   Minutes of Exercise per Session: Not on file  Stress:    Feeling of Stress : Not on file  Social Connections:    Frequency of Communication with Friends and Family: Not on file   Frequency of Social Gatherings with Friends and Family: Not on file   Attends Religious Services: Not on file   Active Member of Clubs or Organizations: Not on file   Attends [employer] Meetings: Not on file   Marital Status: Not on file  Intimate Partner Violence:    Fear of Current or Ex-Partner: Not on file   Emotionally Abused: Not on file   Physically Abused: Not on file   Sexually Abused: Not on file    ROS Review of Systems  Constitutional: Negative.   HENT: Negative.   Eyes: Negative.   Respiratory: Negative.   Cardiovascular: Negative.   Gastrointestinal: Negative.   Genitourinary: Negative.   Musculoskeletal: Negative.   Skin: Negative.   Neurological: Negative.   Psychiatric/Behavioral:  Negative.     Objective:   Today's Vitals: BP (!) 147/81    Pulse 87    Temp 98.2 F (36.8 C) (Temporal)    Resp 18    Ht 5\' 5"  (1.651 m)    SpO2 97%    BMI 23.96 kg/m   Physical Exam Vitals and nursing note reviewed.  Constitutional:      General: He is not in acute distress.    Appearance: Normal appearance. He is not ill-appearing, toxic-appearing or diaphoretic.  Cardiovascular:     Rate and Rhythm: Normal rate and regular rhythm.     Heart sounds: Normal heart sounds. No murmur heard.  No friction rub. No gallop.   Pulmonary:     Effort: Pulmonary effort is normal. No respiratory distress.     Breath sounds: Normal breath sounds. No stridor. No wheezing, rhonchi or rales.  Chest:     Chest wall: No tenderness.    Skin:    General: Skin is warm and dry.  Neurological:     General: No focal deficit present.     Mental Status: He is alert and oriented to person, place, and time. Mental status is at baseline.  Psychiatric:        Mood and Affect: Mood normal.        Behavior: Behavior normal.        Thought Content: Thought content normal.        Judgment: Judgment normal.     Assessment & Plan:   Problem List Items Addressed This Visit      Endocrine   Type 2 diabetes mellitus ([place]) - Primary   Relevant Orders   POCT glycosylated hemoglobin (Hb A1C) (Completed)   TSH (Completed)   CBC with Differential (Completed)   Comprehensive metabolic panel (Completed)   Lipid panel (Completed)      Outpatient Encounter Medications as of 11/23/2019  Medication Sig   amLODipine (NORVASC) 10 MG tablet TAKE 1 TABLET(10 MG) BY MOUTH DAILY   Canagliflozin-metFORMIN HCl ER (INVOKAMET XR) [phone] MG TB24 Take 2 tablets by mouth daily. NEEDS APPOINTMENT/NEED TO ESTABLISH WITH NEW PROVIDER.   Dulaglutide (TRULICITY) 1.5 OV/7.8HY SOPN Inject 1.5 mg into the skin once a week.   glucose blood (ONE TOUCH ULTRA TEST) test strip Use as instructed   Misc Natural Products (OSTEO BI-FLEX ADV DOUBLE ST PO) Take by mouth.   [DISCONTINUED] lisinopril (PRINIVIL,ZESTRIL) 40 MG tablet Take 1 tablet (40 mg total) by mouth daily.   No facility-administered encounter medications on file as of 11/23/2019.    Follow-up: No follow-ups on file.   PLAN  Discussed neuropathy vs sciatic pain. Will hold on xrays for now  Encourage stretching  Checking labs, will plan from there  Patient encouraged to call clinic with any questions, comments, or concerns.  Maximiano Coss, NP

## 2020-02-08 ENCOUNTER — Other Ambulatory Visit: Payer: Self-pay

## 2020-02-08 ENCOUNTER — Encounter: Payer: Self-pay | Admitting: Registered Nurse

## 2020-02-08 ENCOUNTER — Ambulatory Visit (INDEPENDENT_AMBULATORY_CARE_PROVIDER_SITE_OTHER): Payer: 59 | Admitting: Registered Nurse

## 2020-02-08 VITALS — BP 159/72 | HR 88 | Temp 98.2°F | Resp 18 | Ht 65.0 in | Wt 139.0 lb

## 2020-02-08 DIAGNOSIS — Z23 Encounter for immunization: Secondary | ICD-10-CM

## 2020-02-08 DIAGNOSIS — R351 Nocturia: Secondary | ICD-10-CM

## 2020-02-08 DIAGNOSIS — E1140 Type 2 diabetes mellitus with diabetic neuropathy, unspecified: Secondary | ICD-10-CM

## 2020-02-08 DIAGNOSIS — I10 Essential (primary) hypertension: Secondary | ICD-10-CM | POA: Diagnosis not present

## 2020-02-08 DIAGNOSIS — Z13220 Encounter for screening for lipoid disorders: Secondary | ICD-10-CM

## 2020-02-08 LAB — POCT GLYCOSYLATED HEMOGLOBIN (HGB A1C): Hemoglobin A1C: 8.9 % — AB (ref 4.0–5.6)

## 2020-02-08 MED ORDER — LISINOPRIL 40 MG PO TABS: 40.0000 mg | ORAL_TABLET | Freq: Every day | ORAL | 3 refills | Status: DC

## 2020-02-08 MED ORDER — AMLODIPINE BESYLATE 10 MG PO TABS: ORAL_TABLET | ORAL | 3 refills | Status: DC

## 2020-02-08 MED ORDER — TRULICITY 1.5 MG/0.5ML ~~LOC~~ SOAJ: 1.5000 mg | SUBCUTANEOUS | 0 refills | Status: DC

## 2020-02-08 MED ORDER — GLUCOSE BLOOD VI STRP: ORAL_STRIP | 12 refills | Status: DC

## 2020-02-08 MED ORDER — INVOKAMET XR 150-1000 MG PO TB24: 2.0000 | ORAL_TABLET | Freq: Every day | ORAL | 1 refills | Status: DC

## 2020-02-08 NOTE — Patient Instructions (Signed)
° ° ° °  If you have lab work done today you will be contacted with your lab results within the next 2 weeks.  If you have not heard from us then please contact us. The fastest way to get your results is to register for My Chart. ° ° °IF you received an x-ray today, you will receive an invoice from [facility]. Please contact [facility] at [phone] with questions or concerns regarding your invoice.  ° °IF you received labwork today, you will receive an invoice from [facility]. Please contact [facility] at [phone] with questions or concerns regarding your invoice.  ° °Our billing staff will not be able to assist you with questions regarding bills from these companies. ° °You will be contacted with the lab results as soon as they are available. The fastest way to get your results is to activate your My Chart account. Instructions are located on the last page of this paperwork. If you have not heard from us regarding the results in 2 weeks, please contact this office. °  ° ° ° °

## 2020-02-08 NOTE — Progress Notes (Signed)
Established Patient Office Visit  Subjective:  Patient ID: Edward [name], male    DOB: 12-19-1955  Age: 64 y.o. MRN: [id number]  CC:  Chief Complaint  Patient presents with  . Follow-up    Patient is here for 3 month follow up on diabetes.  . Medication Refill    on all medications patient had problems at pharmacy    HPI [place] presents for t2dm follow up  Feeling well, denies worsening of neuropathies Unfortunately had issues at pharmacy - has been off of his medications for a number of weeks Notes that he is trying to adhere to a better diet and regular exercise.  C/o nocturia and slow urine stream. Has been ongoing and steady for most of 2021. No pain with urination or hematuria. No discharge or pain in penis or testicles.  Past Medical History:  Diagnosis Date  . Diabetes mellitus without complication ([place])   . Hypertension     Past Surgical History:  Procedure Laterality Date  . TUMOR REMOVAL      Family History  Problem Relation Age of Onset  . Prostate cancer Father   . Heart attack Paternal Grandfather     Social History   Socioeconomic History  . Marital status: Married    Spouse name: Not on file  . Number of children: 2  . Years of education: 77  . Highest education level: Not on file  Occupational History  . Occupation: Loss adjuster, chartered  Tobacco Use  . Smoking status: Never Smoker  . Smokeless tobacco: Never Used  Substance and Sexual Activity  . Alcohol use: No    Alcohol/week: 0.0 standard drinks  . Drug use: No  . Sexual activity: Not on file  Other Topics Concern  . Not on file  Social History Narrative   Fun: Computers and playing games   Social Determinants of Health   Financial Resource Strain:   . Difficulty of Paying Living Expenses: Not on file  Food Insecurity:   . Worried About Charity fundraiser in the Last Year: Not on file  . Ran Out of Food in the Last Year: Not on file  Transportation Needs:   . Lack of Transportation  (Medical): Not on file  . Lack of Transportation (Non-Medical): Not on file  Physical Activity:   . Days of Exercise per Week: Not on file  . Minutes of Exercise per Session: Not on file  Stress:   . Feeling of Stress : Not on file  Social Connections:   . Frequency of Communication with Friends and Family: Not on file  . Frequency of Social Gatherings with Friends and Family: Not on file  . Attends Religious Services: Not on file  . Active Member of Clubs or Organizations: Not on file  . Attends [employer] Meetings: Not on file  . Marital Status: Not on file  Intimate Partner Violence:   . Fear of Current or Ex-Partner: Not on file  . Emotionally Abused: Not on file  . Physically Abused: Not on file  . Sexually Abused: Not on file    Outpatient Medications Prior to Visit  Medication Sig Dispense Refill  . Misc Natural Products (OSTEO BI-FLEX ADV DOUBLE ST PO) Take by mouth.    Marland Kitchen amLODipine (NORVASC) 10 MG tablet TAKE 1 TABLET(10 MG) BY MOUTH DAILY 90 tablet 3  . Canagliflozin-metFORMIN HCl ER (INVOKAMET XR) [phone] MG TB24 Take 2 tablets by mouth daily. NEEDS APPOINTMENT/NEED TO ESTABLISH WITH NEW PROVIDER.  60 tablet 0  . Dulaglutide (TRULICITY) 1.5 GB/1.5VV SOPN Inject 1.5 mg into the skin once a week. 2 mL 0  . glucose blood (ONE TOUCH ULTRA TEST) test strip Use as instructed 100 each 12  . lisinopril (ZESTRIL) 40 MG tablet Take 1 tablet (40 mg total) by mouth daily. 90 tablet 3   No facility-administered medications prior to visit.    No Known Allergies  ROS Review of Systems  Constitutional: Negative.   HENT: Negative.   Eyes: Negative.   Respiratory: Negative.   Cardiovascular: Negative.   Gastrointestinal: Negative.   Genitourinary: Negative.   Musculoskeletal: Negative.   Skin: Negative.   Neurological: Negative.   Psychiatric/Behavioral: Negative.       Objective:    Physical Exam Constitutional:      General: He is not in acute distress.     Appearance: Normal appearance. He is normal weight. He is not ill-appearing, toxic-appearing or diaphoretic.  Cardiovascular:     Rate and Rhythm: Normal rate and regular rhythm.     Heart sounds: Normal heart sounds. No murmur heard.  No friction rub. No gallop.   Pulmonary:     Effort: Pulmonary effort is normal. No respiratory distress.     Breath sounds: Normal breath sounds. No stridor. No wheezing, rhonchi or rales.  Chest:     Chest wall: No tenderness.  Neurological:     General: No focal deficit present.     Mental Status: He is alert and oriented to person, place, and time. Mental status is at baseline.  Psychiatric:        Mood and Affect: Mood normal.        Behavior: Behavior normal.        Thought Content: Thought content normal.        Judgment: Judgment normal.     BP (!) 159/72   Pulse 88   Temp 98.2 F (36.8 C) (Temporal)   Resp 18   Ht 5\' 5"  (1.651 m)   Wt 139 lb (63 kg)   SpO2 98%   BMI 23.13 kg/m  Wt Readings from Last 3 Encounters:  02/08/20 139 lb (63 kg)  04/12/18 144 lb (65.3 kg)  02/10/18 141 lb (64 kg)     Health Maintenance Due  Topic Date Due  . OPHTHALMOLOGY EXAM  05/03/2018  . INFLUENZA VACCINE  Never done    There are no preventive care reminders to display for this patient.  Lab Results  Component Value Date   TSH 0.898 11/23/2019   Lab Results  Component Value Date   WBC 8.4 11/23/2019   HGB 15.0 11/23/2019   HCT 46.4 11/23/2019   MCV 90 11/23/2019   PLT 262 11/23/2019   Lab Results  Component Value Date   NA 137 11/23/2019   K 4.1 11/23/2019   CO2 22 11/23/2019   GLUCOSE 186 (H) 11/23/2019   BUN 15 11/23/2019   CREATININE 0.71 (L) 11/23/2019   BILITOT 0.5 11/23/2019   ALKPHOS 96 11/23/2019   AST 22 11/23/2019   ALT 33 11/23/2019   PROT 7.4 11/23/2019   ALBUMIN 4.4 11/23/2019   CALCIUM 9.4 11/23/2019   GFR 144.70 02/10/2018   Lab Results  Component Value Date   CHOL 201 (H) 11/23/2019   Lab Results    Component Value Date   HDL 42 11/23/2019   Lab Results  Component Value Date   LDLCALC 111 (H) 11/23/2019   Lab Results  Component Value Date  TRIG 279 (H) 11/23/2019   Lab Results  Component Value Date   CHOLHDL 4.8 11/23/2019   Lab Results  Component Value Date   HGBA1C 9.1 (A) 11/23/2019      Assessment & Plan:   Problem List Items Addressed This Visit      Cardiovascular and Mediastinum   Essential hypertension   Relevant Medications   amLODipine (NORVASC) 10 MG tablet   lisinopril (ZESTRIL) 40 MG tablet     Endocrine   Type 2 diabetes mellitus ([facility])   Relevant Medications   Canagliflozin-metFORMIN HCl ER (INVOKAMET XR) [phone] MG TB24   Dulaglutide (TRULICITY) 1.5 AJ/6.8TL SOPN   glucose blood (ONE TOUCH ULTRA TEST) test strip   lisinopril (ZESTRIL) 40 MG tablet   Other Relevant Orders   POCT glycosylated hemoglobin (Hb A1C)    Other Visit Diagnoses    Need for shingles vaccine    -  Primary   Relevant Orders   Varicella-zoster vaccine IM      Meds ordered this encounter  Medications  . amLODipine (NORVASC) 10 MG tablet    Sig: TAKE 1 TABLET(10 MG) BY MOUTH DAILY    Dispense:  90 tablet    Refill:  3    Order Specific Question:   Supervising Provider    Answer:   Carlota Raspberry, JEFFREY R [[id number]]  . Canagliflozin-metFORMIN HCl ER (INVOKAMET XR) [phone] MG TB24    Sig: Take 2 tablets by mouth daily. NEEDS APPOINTMENT/NEED TO ESTABLISH WITH NEW PROVIDER.    Dispense:  180 tablet    Refill:  1    Order Specific Question:   Supervising Provider    Answer:   Carlota Raspberry, JEFFREY R [[id number]]  . Dulaglutide (TRULICITY) 1.5 OM/3.5DH SOPN    Sig: Inject 1.5 mg into the skin once a week.    Dispense:  2 mL    Refill:  0    No further refills without OV    Order Specific Question:   Supervising Provider    Answer:   Carlota Raspberry, JEFFREY R [[id number]]  . glucose blood (ONE TOUCH ULTRA TEST) test strip    Sig: Use as instructed    Dispense:  100 each    Refill:  12     Substitution permissible per patient device and insurance coverage. Dx E11.9    Order Specific Question:   Supervising Provider    Answer:   Carlota Raspberry, JEFFREY R [[id number]]  . lisinopril (ZESTRIL) 40 MG tablet    Sig: Take 1 tablet (40 mg total) by mouth daily.    Dispense:  90 tablet    Refill:  3    Order Specific Question:   Supervising Provider    Answer:   Carlota Raspberry, JEFFREY R [[id number]]    Follow-up: No follow-ups on file.   PLAN  a1c today at: 8.9. modest improvement without meds. Will hope for better improvement with meds.  Refill all meds  Return in 3 mo for t2dm check  Refer to ophth for exam  Will draw PSA for nocturia. Will follow up as warranted  Patient encouraged to call clinic with any questions, comments, or concerns.  Maximiano Coss, NP

## 2020-02-09 LAB — CBC
Hematocrit: 42.5 % (ref 37.5–51.0)
Hemoglobin: 14.5 g/dL (ref 13.0–17.7)
MCH: 30 pg (ref 26.6–33.0)
MCHC: 34.1 g/dL (ref 31.5–35.7)
MCV: 88 fL (ref 79–97)
Platelets: 313 10*3/uL (ref 150–450)
RBC: 4.84 x10E6/uL (ref 4.14–5.80)
RDW: 12.1 % (ref 11.6–15.4)
WBC: 6.9 10*3/uL (ref 3.4–10.8)

## 2020-02-09 LAB — BASIC METABOLIC PANEL
BUN/Creatinine Ratio: 19 (ref 10–24)
BUN: 11 mg/dL (ref 8–27)
CO2: 23 mmol/L (ref 20–29)
Calcium: 9.6 mg/dL (ref 8.6–10.2)
Chloride: 101 mmol/L (ref 96–106)
Creatinine, Ser: 0.59 mg/dL — ABNORMAL LOW (ref 0.76–1.27)
GFR calc Af Amer: 124 mL/min/{1.73_m2} (ref >59)
GFR calc non Af Amer: 107 mL/min/{1.73_m2} (ref >59)
Glucose: 135 mg/dL — ABNORMAL HIGH (ref 65–99)
Potassium: 3.9 mmol/L (ref 3.5–5.2)
Sodium: 138 mmol/L (ref 134–144)

## 2020-02-09 LAB — LIPID PANEL
Chol/HDL Ratio: 2.7 ratio (ref 0.0–5.0)
Cholesterol, Total: 118 mg/dL (ref 100–199)
HDL: 44 mg/dL (ref >39)
LDL Chol Calc (NIH): 48 mg/dL (ref 0–99)
Triglycerides: 155 mg/dL — ABNORMAL HIGH (ref 0–149)
VLDL Cholesterol Cal: 26 mg/dL (ref 5–40)

## 2020-02-09 LAB — PSA: Prostate Specific Ag, Serum: 1.3 ng/mL (ref 0.0–4.0)

## 2020-02-21 LAB — HM DIABETES EYE EXAM

## 2020-04-09 ENCOUNTER — Other Ambulatory Visit: Payer: Self-pay | Admitting: Registered Nurse

## 2020-04-09 DIAGNOSIS — E1140 Type 2 diabetes mellitus with diabetic neuropathy, unspecified: Secondary | ICD-10-CM

## 2020-04-11 ENCOUNTER — Ambulatory Visit (INDEPENDENT_AMBULATORY_CARE_PROVIDER_SITE_OTHER): Payer: 59 | Admitting: Registered Nurse

## 2020-04-11 ENCOUNTER — Encounter: Payer: Self-pay | Admitting: Registered Nurse

## 2020-04-11 ENCOUNTER — Other Ambulatory Visit: Payer: Self-pay

## 2020-04-11 VITALS — BP 147/79 | HR 84 | Temp 98.0°F | Resp 18 | Ht 65.0 in | Wt 140.8 lb

## 2020-04-11 DIAGNOSIS — I10 Essential (primary) hypertension: Secondary | ICD-10-CM | POA: Diagnosis not present

## 2020-04-11 DIAGNOSIS — R351 Nocturia: Secondary | ICD-10-CM

## 2020-04-11 DIAGNOSIS — Z23 Encounter for immunization: Secondary | ICD-10-CM

## 2020-04-11 DIAGNOSIS — E1140 Type 2 diabetes mellitus with diabetic neuropathy, unspecified: Secondary | ICD-10-CM

## 2020-04-11 LAB — POCT GLYCOSYLATED HEMOGLOBIN (HGB A1C): Hemoglobin A1C: 8.5 % — AB (ref 4.0–5.6)

## 2020-04-11 MED ORDER — DULAGLUTIDE 3 MG/0.5ML ~~LOC~~ SOAJ: 3.0000 mg | SUBCUTANEOUS | 0 refills | Status: DC

## 2020-04-11 MED ORDER — INVOKAMET XR 150-1000 MG PO TB24: 2.0000 | ORAL_TABLET | Freq: Every day | ORAL | 1 refills | Status: DC

## 2020-04-11 MED ORDER — LISINOPRIL 40 MG PO TABS: 40.0000 mg | ORAL_TABLET | Freq: Every day | ORAL | 3 refills | Status: DC

## 2020-04-11 MED ORDER — AMLODIPINE BESYLATE 10 MG PO TABS: ORAL_TABLET | ORAL | 3 refills | Status: DC

## 2020-04-11 NOTE — Patient Instructions (Signed)
° ° ° °  If you have lab work done today you will be contacted with your lab results within the next 2 weeks.  If you have not heard from us then please contact us. The fastest way to get your results is to register for My Chart. ° ° °IF you received an x-ray today, you will receive an invoice from [facility]. Please contact  Radiology at [phone] with questions or concerns regarding your invoice.  ° °IF you received labwork today, you will receive an invoice from [facility]. Please contact [facility] at [phone] with questions or concerns regarding your invoice.  ° °Our billing staff will not be able to assist you with questions regarding bills from these companies. ° °You will be contacted with the lab results as soon as they are available. The fastest way to get your results is to activate your My Chart account. Instructions are located on the last page of this paperwork. If you have not heard from us regarding the results in 2 weeks, please contact this office. °  ° ° ° °

## 2020-04-13 ENCOUNTER — Encounter: Payer: Self-pay | Admitting: Registered Nurse

## 2020-04-13 NOTE — Progress Notes (Signed)
Established Patient Office Visit  Subjective:  Patient ID: Edward [name], male    DOB: 10/16/1955  Age: 64 y.o. MRN: [id number]  CC:  Chief Complaint  Patient presents with  . Follow-up    Per patient he is here for follow up for Diabetes. And to also get the second shingle vaccine.    HPI Edward [name] presents for t2dm follow up and second shingles inj  Last A1c: 8.9 Currently taking: inovkamet 150-1000mg  ER PO qd, dulaglutide 1.5mg /6.8LE No new complications Reports good compliance with medications Diet has been improving, steady Exercise habits have been consistent   Past Medical History:  Diagnosis Date  . Diabetes mellitus without complication ([place])   . Hypertension     Past Surgical History:  Procedure Laterality Date  . TUMOR REMOVAL      Family History  Problem Relation Age of Onset  . Prostate cancer Father   . Heart attack Paternal Grandfather     Social History   Socioeconomic History  . Marital status: Married    Spouse name: Not on file  . Number of children: 2  . Years of education: 88  . Highest education level: Not on file  Occupational History  . Occupation: Loss adjuster, chartered  Tobacco Use  . Smoking status: Never Smoker  . Smokeless tobacco: Never Used  Substance and Sexual Activity  . Alcohol use: No    Alcohol/week: 0.0 standard drinks  . Drug use: No  . Sexual activity: Not on file  Other Topics Concern  . Not on file  Social History Narrative   Fun: Computers and playing games   Social Determinants of Health   Financial Resource Strain: Not on file  Food Insecurity: Not on file  Transportation Needs: Not on file  Physical Activity: Not on file  Stress: Not on file  Social Connections: Not on file  Intimate Partner Violence: Not on file    Outpatient Medications Prior to Visit  Medication Sig Dispense Refill  . glucose blood (ONE TOUCH ULTRA TEST) test strip Use as instructed 100 each 12  . Misc Natural Products (OSTEO BI-FLEX ADV  DOUBLE ST PO) Take by mouth.    Marland Kitchen amLODipine (NORVASC) 10 MG tablet TAKE 1 TABLET(10 MG) BY MOUTH DAILY 90 tablet 3  . Canagliflozin-metFORMIN HCl ER (INVOKAMET XR) [phone] MG TB24 Take 2 tablets by mouth daily. NEEDS APPOINTMENT/NEED TO ESTABLISH WITH NEW PROVIDER. 180 tablet 1  . lisinopril (ZESTRIL) 40 MG tablet Take 1 tablet (40 mg total) by mouth daily. 90 tablet 3  . TRULICITY 1.5 XN/1.7GY SOPN ADMINISTER 1.5 MG UNDER THE SKIN 1 TIME A WEEK 2 mL 1   No facility-administered medications prior to visit.    No Known Allergies  ROS Review of Systems  Constitutional: Negative.   HENT: Negative.   Eyes: Negative.   Respiratory: Negative.   Cardiovascular: Negative.   Gastrointestinal: Negative.   Genitourinary: Negative.   Musculoskeletal: Negative.   Skin: Negative.   Neurological: Negative.   Psychiatric/Behavioral: Negative.   All other systems reviewed and are negative.     Objective:    Physical Exam Constitutional:      General: He is not in acute distress.    Appearance: Normal appearance. He is normal weight. He is not ill-appearing, toxic-appearing or diaphoretic.  Cardiovascular:     Rate and Rhythm: Normal rate and regular rhythm.     Heart sounds: Normal heart sounds. No murmur heard. No friction rub. No gallop.   Pulmonary:  Effort: Pulmonary effort is normal. No respiratory distress.     Breath sounds: Normal breath sounds. No stridor. No wheezing, rhonchi or rales.  Chest:     Chest wall: No tenderness.  Neurological:     General: No focal deficit present.     Mental Status: He is alert and oriented to person, place, and time. Mental status is at baseline.  Psychiatric:        Mood and Affect: Mood normal.        Behavior: Behavior normal.        Thought Content: Thought content normal.        Judgment: Judgment normal.     BP (!) 147/79   Pulse 84   Temp 98 F (36.7 C) (Temporal)   Resp 18   Ht 5\' 5"  (1.651 m)   Wt 140 lb 12.8 oz (63.9  kg)   SpO2 98%   BMI 23.43 kg/m  Wt Readings from Last 3 Encounters:  04/11/20 140 lb 12.8 oz (63.9 kg)  02/08/20 139 lb (63 kg)  04/12/18 144 lb (65.3 kg)     Health Maintenance Due  Topic Date Due  . PNA vac Low Risk Adult (1 of 2 - PCV13) 04/02/2020    There are no preventive care reminders to display for this patient.  Lab Results  Component Value Date   TSH 0.898 11/23/2019   Lab Results  Component Value Date   WBC 6.9 02/08/2020   HGB 14.5 02/08/2020   HCT 42.5 02/08/2020   MCV 88 02/08/2020   PLT 313 02/08/2020   Lab Results  Component Value Date   NA 138 02/08/2020   K 3.9 02/08/2020   CO2 23 02/08/2020   GLUCOSE 135 (H) 02/08/2020   BUN 11 02/08/2020   CREATININE 0.59 (L) 02/08/2020   BILITOT 0.5 11/23/2019   ALKPHOS 96 11/23/2019   AST 22 11/23/2019   ALT 33 11/23/2019   PROT 7.4 11/23/2019   ALBUMIN 4.4 11/23/2019   CALCIUM 9.6 02/08/2020   GFR 144.70 02/10/2018   Lab Results  Component Value Date   CHOL 118 02/08/2020   Lab Results  Component Value Date   HDL 44 02/08/2020   Lab Results  Component Value Date   LDLCALC 48 02/08/2020   Lab Results  Component Value Date   TRIG 155 (H) 02/08/2020   Lab Results  Component Value Date   CHOLHDL 2.7 02/08/2020   Lab Results  Component Value Date   HGBA1C 8.5 (A) 04/11/2020      Assessment & Plan:   Problem List Items Addressed This Visit      Cardiovascular and Mediastinum   Essential hypertension   Relevant Medications   lisinopril (ZESTRIL) 40 MG tablet   amLODipine (NORVASC) 10 MG tablet     Endocrine   Type 2 diabetes mellitus (HCC) - Primary   Relevant Medications   lisinopril (ZESTRIL) 40 MG tablet   Canagliflozin-metFORMIN HCl ER (INVOKAMET XR) [phone] MG TB24   Dulaglutide 3 MG/0.5ML SOPN   Other Relevant Orders   POCT glycosylated hemoglobin (Hb A1C) (Completed)    Other Visit Diagnoses    Nocturia       Need for shingles vaccine       Relevant Orders    Varicella-zoster vaccine IM (Shingrix) (Completed)      Meds ordered this encounter  Medications  . lisinopril (ZESTRIL) 40 MG tablet    Sig: Take 1 tablet (40 mg total) by mouth daily.  Dispense:  90 tablet    Refill:  3    Order Specific Question:   Supervising Provider    Answer:   Carlota Raspberry, JEFFREY R [[id number]]  . Canagliflozin-metFORMIN HCl ER (INVOKAMET XR) [phone] MG TB24    Sig: Take 2 tablets by mouth daily.    Dispense:  180 tablet    Refill:  1    Order Specific Question:   Supervising Provider    Answer:   Carlota Raspberry, JEFFREY R [[id number]]  . amLODipine (NORVASC) 10 MG tablet    Sig: TAKE 1 TABLET(10 MG) BY MOUTH DAILY    Dispense:  90 tablet    Refill:  3    Order Specific Question:   Supervising Provider    Answer:   Carlota Raspberry, JEFFREY R [[id number]]  . Dulaglutide 3 MG/0.5ML SOPN    Sig: Inject 3 mg into the skin once a week.    Dispense:  6 mL    Refill:  0    Order Specific Question:   Supervising Provider    Answer:   Carlota Raspberry, JEFFREY R [[id number]]   Lab Results  Component Value Date   HGBA1C 8.5 (A) 04/11/2020    Follow-up: Return in about 3 months (around 07/10/2020) for early march for t2dm check.   PLAN  a1c today mildly improved to 8.5  Will increase trulicity to 3mg /0.68mL weekly  Follow up in 3 mo  Ideal control is below 7.0  Second shingles shot given  Patient encouraged to call clinic with any questions, comments, or concerns.  Maximiano Coss, NP

## 2020-07-11 ENCOUNTER — Encounter: Payer: Self-pay | Admitting: Registered Nurse

## 2020-07-11 ENCOUNTER — Other Ambulatory Visit: Payer: Self-pay

## 2020-07-11 ENCOUNTER — Ambulatory Visit (INDEPENDENT_AMBULATORY_CARE_PROVIDER_SITE_OTHER): Payer: Medicare HMO | Admitting: Registered Nurse

## 2020-07-11 VITALS — BP 155/82 | HR 93 | Temp 97.6°F | Resp 18 | Ht 65.0 in | Wt 140.0 lb

## 2020-07-11 DIAGNOSIS — G4726 Circadian rhythm sleep disorder, shift work type: Secondary | ICD-10-CM | POA: Diagnosis not present

## 2020-07-11 DIAGNOSIS — E1140 Type 2 diabetes mellitus with diabetic neuropathy, unspecified: Secondary | ICD-10-CM

## 2020-07-11 DIAGNOSIS — M7062 Trochanteric bursitis, left hip: Secondary | ICD-10-CM

## 2020-07-11 DIAGNOSIS — I10 Essential (primary) hypertension: Secondary | ICD-10-CM | POA: Diagnosis not present

## 2020-07-11 LAB — POCT GLYCOSYLATED HEMOGLOBIN (HGB A1C): Hemoglobin A1C: 7.8 % — AB (ref 4.0–5.6)

## 2020-07-11 MED ORDER — GLUCOSE BLOOD VI STRP: ORAL_STRIP | 12 refills | Status: DC

## 2020-07-11 MED ORDER — AMLODIPINE BESYLATE 10 MG PO TABS: ORAL_TABLET | ORAL | 3 refills | Status: DC

## 2020-07-11 MED ORDER — HYDROXYZINE HCL 10 MG PO TABS: 10.0000 mg | ORAL_TABLET | Freq: Every evening | ORAL | 3 refills | Status: DC | PRN

## 2020-07-11 MED ORDER — DICLOFENAC SODIUM 1 % EX GEL: 2.0000 g | Freq: Four times a day (QID) | CUTANEOUS | 3 refills | Status: DC

## 2020-07-11 MED ORDER — INVOKAMET XR 150-1000 MG PO TB24
2.0000 | ORAL_TABLET | Freq: Every day | ORAL | 1 refills | Status: DC
Start: 2020-07-11 — End: 2021-04-02

## 2020-07-11 MED ORDER — DULAGLUTIDE 3 MG/0.5ML ~~LOC~~ SOAJ
3.0000 mg | SUBCUTANEOUS | 0 refills | Status: DC
Start: 2020-07-11 — End: 2021-04-02

## 2020-07-11 MED ORDER — LISINOPRIL 40 MG PO TABS
40.0000 mg | ORAL_TABLET | Freq: Every day | ORAL | 3 refills | Status: DC
Start: 2020-07-11 — End: 2021-04-02

## 2020-07-11 NOTE — Patient Instructions (Signed)
° ° ° °  If you have lab work done today you will be contacted with your lab results within the next 2 weeks.  If you have not heard from us then please contact us. The fastest way to get your results is to register for My Chart. ° ° °IF you received an x-ray today, you will receive an invoice from [facility]. Please contact [facility] at [phone] with questions or concerns regarding your invoice.  ° °IF you received labwork today, you will receive an invoice from [facility]. Please contact [facility] at [phone] with questions or concerns regarding your invoice.  ° °Our billing staff will not be able to assist you with questions regarding bills from these companies. ° °You will be contacted with the lab results as soon as they are available. The fastest way to get your results is to activate your My Chart account. Instructions are located on the last page of this paperwork. If you have not heard from us regarding the results in 2 weeks, please contact this office. °  ° ° ° °

## 2020-07-11 NOTE — Progress Notes (Signed)
Established Patient Office Visit  Subjective:  Patient ID: Edward [name], male    DOB: [birth year]  Age: 65 y.o. MRN: [id number]  CC:  Chief Complaint  Patient presents with  . Follow-up    Patient is here for his 3 month follow up for diabetes.Patient has no other concerns     HPI Edward [name] presents for t2dm  Last A1c: 8.5 on 04/11/20 Currently taking: invokamet xr 150-1000mg  PO bid ac, dulaglutide 3mg  weekly No new complications Reports good compliance with medications Diet has been improved Exercise habits have been more consistent  Hip pain Lateral, superficial, L side only Worse as day goes on, cannot lie on it No acute injury No limits to ROM Has not tried tx  Notes he has trouble falling asleep Works third shift Some has to do with pain but otherwise doesn't feel tired Has not tried medication. Good sleep hygiene, active outside of work.  Past Medical History:  Diagnosis Date  . Diabetes mellitus without complication ([place])   . Hypertension     Past Surgical History:  Procedure Laterality Date  . TUMOR REMOVAL      Family History  Problem Relation Age of Onset  . Prostate cancer Father   . Heart attack Paternal Grandfather     Social History   Socioeconomic History  . Marital status: Married    Spouse name: Not on file  . Number of children: 2  . Years of education: 71  . Highest education level: Not on file  Occupational History  . Occupation: Loss adjuster, chartered  Tobacco Use  . Smoking status: Never Smoker  . Smokeless tobacco: Never Used  Substance and Sexual Activity  . Alcohol use: No    Alcohol/week: 0.0 standard drinks  . Drug use: No  . Sexual activity: Not on file  Other Topics Concern  . Not on file  Social History Narrative   Fun: Computers and playing games   Social Determinants of Health   Financial Resource Strain: Not on file  Food Insecurity: Not on file  Transportation Needs: Not on file  Physical Activity: Not on file   Stress: Not on file  Social Connections: Not on file  Intimate Partner Violence: Not on file    Outpatient Medications Prior to Visit  Medication Sig Dispense Refill  . Misc Natural Products (OSTEO BI-FLEX ADV DOUBLE ST PO) Take by mouth.    Marland Kitchen amLODipine (NORVASC) 10 MG tablet TAKE 1 TABLET(10 MG) BY MOUTH DAILY 90 tablet 3  . Canagliflozin-metFORMIN HCl ER (INVOKAMET XR) [phone] MG TB24 Take 2 tablets by mouth daily. 180 tablet 1  . Dulaglutide 3 MG/0.5ML SOPN Inject 3 mg into the skin once a week. 6 mL 0  . glucose blood (ONE TOUCH ULTRA TEST) test strip Use as instructed 100 each 12  . lisinopril (ZESTRIL) 40 MG tablet Take 1 tablet (40 mg total) by mouth daily. 90 tablet 3   No facility-administered medications prior to visit.    No Known Allergies  ROS Review of Systems  Constitutional: Negative.   HENT: Negative.   Eyes: Negative.   Respiratory: Negative.   Cardiovascular: Negative.   Gastrointestinal: Negative.   Genitourinary: Negative.   Musculoskeletal: Positive for arthralgias.  Skin: Negative.   Neurological: Negative.   Psychiatric/Behavioral: Positive for sleep disturbance.  All other systems reviewed and are negative.     Objective:    Physical Exam Constitutional:      General: He is not in acute distress.  Appearance: Normal appearance. He is normal weight. He is not ill-appearing, toxic-appearing or diaphoretic.  Cardiovascular:     Rate and Rhythm: Normal rate and regular rhythm.     Heart sounds: Normal heart sounds. No murmur heard. No friction rub. No gallop.   Pulmonary:     Effort: Pulmonary effort is normal. No respiratory distress.     Breath sounds: Normal breath sounds. No stridor. No wheezing, rhonchi or rales.  Chest:     Chest wall: No tenderness.  Musculoskeletal:        General: Tenderness (greater trochanter L) present. No swelling, deformity or signs of injury. Normal range of motion.     Right lower leg: No edema.     Left  lower leg: No edema.  Skin:    General: Skin is warm and dry.  Neurological:     General: No focal deficit present.     Mental Status: He is alert and oriented to person, place, and time. Mental status is at baseline.  Psychiatric:        Mood and Affect: Mood normal.        Behavior: Behavior normal.        Thought Content: Thought content normal.        Judgment: Judgment normal.     BP (!) 155/82   Pulse 93   Temp 97.6 F (36.4 C) (Temporal)   Resp 18   Ht 5\' 5"  (1.651 m)   Wt 140 lb (63.5 kg)   SpO2 99%   BMI 23.30 kg/m  Wt Readings from Last 3 Encounters:  07/11/20 140 lb (63.5 kg)  04/11/20 140 lb 12.8 oz (63.9 kg)  02/08/20 139 lb (63 kg)     There are no preventive care reminders to display for this patient.  There are no preventive care reminders to display for this patient.  Lab Results  Component Value Date   TSH 0.898 11/23/2019   Lab Results  Component Value Date   WBC 6.9 02/08/2020   HGB 14.5 02/08/2020   HCT 42.5 02/08/2020   MCV 88 02/08/2020   PLT 313 02/08/2020   Lab Results  Component Value Date   NA 138 02/08/2020   K 3.9 02/08/2020   CO2 23 02/08/2020   GLUCOSE 135 (H) 02/08/2020   BUN 11 02/08/2020   CREATININE 0.59 (L) 02/08/2020   BILITOT 0.5 11/23/2019   ALKPHOS 96 11/23/2019   AST 22 11/23/2019   ALT 33 11/23/2019   PROT 7.4 11/23/2019   ALBUMIN 4.4 11/23/2019   CALCIUM 9.6 02/08/2020   GFR 144.70 02/10/2018   Lab Results  Component Value Date   CHOL 118 02/08/2020   Lab Results  Component Value Date   HDL 44 02/08/2020   Lab Results  Component Value Date   LDLCALC 48 02/08/2020   Lab Results  Component Value Date   TRIG 155 (H) 02/08/2020   Lab Results  Component Value Date   CHOLHDL 2.7 02/08/2020   Lab Results  Component Value Date   HGBA1C 7.8 (A) 07/11/2020      Assessment & Plan:   Problem List Items Addressed This Visit      Cardiovascular and Mediastinum   Essential hypertension    Relevant Medications   lisinopril (ZESTRIL) 40 MG tablet   amLODipine (NORVASC) 10 MG tablet     Endocrine   Type 2 diabetes mellitus (HCC) - Primary   Relevant Medications   lisinopril (ZESTRIL) 40 MG tablet   Dulaglutide  3 MG/0.5ML SOPN   Canagliflozin-metFORMIN HCl ER (INVOKAMET XR) [phone] MG TB24   glucose blood (ONE TOUCH ULTRA TEST) test strip   Other Relevant Orders   POCT glycosylated hemoglobin (Hb A1C) (Completed)    Other Visit Diagnoses    Greater trochanteric bursitis of left hip       Relevant Medications   diclofenac Sodium (VOLTAREN) 1 % GEL   Shift work sleep disorder       Relevant Medications   hydrOXYzine (ATARAX/VISTARIL) 10 MG tablet      Meds ordered this encounter  Medications  . diclofenac Sodium (VOLTAREN) 1 % GEL    Sig: Apply 2 g topically 4 (four) times daily.    Dispense:  100 g    Refill:  3    Order Specific Question:   Supervising Provider    Answer:   Carlota Raspberry, JEFFREY R [[id number]]  . hydrOXYzine (ATARAX/VISTARIL) 10 MG tablet    Sig: Take 1-2 tablets (10-20 mg total) by mouth at bedtime as needed.    Dispense:  30 tablet    Refill:  3    Order Specific Question:   Supervising Provider    Answer:   Carlota Raspberry, JEFFREY R [[id number]]  . lisinopril (ZESTRIL) 40 MG tablet    Sig: Take 1 tablet (40 mg total) by mouth daily.    Dispense:  90 tablet    Refill:  3    Order Specific Question:   Supervising Provider    Answer:   Carlota Raspberry, JEFFREY R [[id number]]  . Dulaglutide 3 MG/0.5ML SOPN    Sig: Inject 3 mg into the skin once a week.    Dispense:  6 mL    Refill:  0    Order Specific Question:   Supervising Provider    Answer:   Carlota Raspberry, JEFFREY R [[id number]]  . Canagliflozin-metFORMIN HCl ER (INVOKAMET XR) [phone] MG TB24    Sig: Take 2 tablets by mouth daily.    Dispense:  180 tablet    Refill:  1    Order Specific Question:   Supervising Provider    Answer:   Carlota Raspberry, JEFFREY R [[id number]]  . amLODipine (NORVASC) 10 MG tablet    Sig: TAKE 1 TABLET(10 MG) BY  MOUTH DAILY    Dispense:  90 tablet    Refill:  3    Order Specific Question:   Supervising Provider    Answer:   Carlota Raspberry, JEFFREY R [[id number]]  . glucose blood (ONE TOUCH ULTRA TEST) test strip    Sig: Use as instructed    Dispense:  100 each    Refill:  12    Substitution permissible per patient device and insurance coverage. Dx E11.9    Order Specific Question:   Supervising Provider    Answer:   Carlota Raspberry, JEFFREY R [[id number]]    Follow-up: No follow-ups on file.   PLAN  Continue current regimen given good improvement in a1c  Return in 3 mo  Diclofenac for suspected bursitis.  Hydroxyzine for shift work sleep do, can consider more potent medication if warranted but would prefer to avoid  Patient encouraged to call clinic with any questions, comments, or concerns.  Maximiano Coss, NP

## 2020-07-15 ENCOUNTER — Telehealth: Payer: Self-pay | Admitting: *Deleted

## 2020-07-15 NOTE — Telephone Encounter (Signed)
PA for Trulicity   KEY:  [id number]

## 2020-07-16 ENCOUNTER — Encounter: Payer: Self-pay | Admitting: *Deleted

## 2020-10-21 ENCOUNTER — Inpatient Hospital Stay (HOSPITAL_COMMUNITY): Payer: Medicare HMO

## 2020-10-21 ENCOUNTER — Inpatient Hospital Stay (HOSPITAL_COMMUNITY)
Admission: EM | Admit: 2020-10-21 | Discharge: 2020-10-31 | DRG: 187 | Disposition: A | Discharge status: Home / Self Care | Payer: Medicare HMO | Attending: Internal Medicine | Admitting: Internal Medicine

## 2020-10-21 ENCOUNTER — Encounter (HOSPITAL_COMMUNITY): Payer: Self-pay

## 2020-10-21 ENCOUNTER — Other Ambulatory Visit: Payer: Self-pay

## 2020-10-21 ENCOUNTER — Emergency Department (HOSPITAL_COMMUNITY): Payer: Medicare HMO

## 2020-10-21 DIAGNOSIS — R079 Chest pain, unspecified: Secondary | ICD-10-CM | POA: Diagnosis not present

## 2020-10-21 DIAGNOSIS — K7689 Other specified diseases of liver: Secondary | ICD-10-CM | POA: Diagnosis not present

## 2020-10-21 DIAGNOSIS — I700 Atherosclerosis of aorta: Secondary | ICD-10-CM | POA: Diagnosis not present

## 2020-10-21 DIAGNOSIS — Z4682 Encounter for fitting and adjustment of non-vascular catheter: Secondary | ICD-10-CM | POA: Diagnosis not present

## 2020-10-21 DIAGNOSIS — Z9689 Presence of other specified functional implants: Secondary | ICD-10-CM

## 2020-10-21 DIAGNOSIS — S40011A Contusion of right shoulder, initial encounter: Secondary | ICD-10-CM | POA: Diagnosis not present

## 2020-10-21 DIAGNOSIS — I517 Cardiomegaly: Secondary | ICD-10-CM | POA: Diagnosis not present

## 2020-10-21 DIAGNOSIS — Z8616 Personal history of COVID-19: Secondary | ICD-10-CM

## 2020-10-21 DIAGNOSIS — R9389 Abnormal findings on diagnostic imaging of other specified body structures: Secondary | ICD-10-CM | POA: Diagnosis present

## 2020-10-21 DIAGNOSIS — W1830XA Fall on same level, unspecified, initial encounter: Secondary | ICD-10-CM | POA: Diagnosis present

## 2020-10-21 DIAGNOSIS — Z8042 Family history of malignant neoplasm of prostate: Secondary | ICD-10-CM | POA: Diagnosis not present

## 2020-10-21 DIAGNOSIS — Z79899 Other long term (current) drug therapy: Secondary | ICD-10-CM | POA: Diagnosis not present

## 2020-10-21 DIAGNOSIS — J948 Other specified pleural conditions: Secondary | ICD-10-CM | POA: Diagnosis not present

## 2020-10-21 DIAGNOSIS — E1165 Type 2 diabetes mellitus with hyperglycemia: Secondary | ICD-10-CM

## 2020-10-21 DIAGNOSIS — Z23 Encounter for immunization: Secondary | ICD-10-CM | POA: Diagnosis not present

## 2020-10-21 DIAGNOSIS — R0600 Dyspnea, unspecified: Secondary | ICD-10-CM | POA: Diagnosis not present

## 2020-10-21 DIAGNOSIS — Z8249 Family history of ischemic heart disease and other diseases of the circulatory system: Secondary | ICD-10-CM | POA: Diagnosis not present

## 2020-10-21 DIAGNOSIS — J9690 Respiratory failure, unspecified, unspecified whether with hypoxia or hypercapnia: Secondary | ICD-10-CM

## 2020-10-21 DIAGNOSIS — Z7984 Long term (current) use of oral hypoglycemic drugs: Secondary | ICD-10-CM

## 2020-10-21 DIAGNOSIS — E222 Syndrome of inappropriate secretion of antidiuretic hormone: Secondary | ICD-10-CM | POA: Diagnosis not present

## 2020-10-21 DIAGNOSIS — R0602 Shortness of breath: Secondary | ICD-10-CM

## 2020-10-21 DIAGNOSIS — R509 Fever, unspecified: Secondary | ICD-10-CM

## 2020-10-21 DIAGNOSIS — R6510 Systemic inflammatory response syndrome (SIRS) of non-infectious origin without acute organ dysfunction: Secondary | ICD-10-CM | POA: Diagnosis not present

## 2020-10-21 DIAGNOSIS — E1140 Type 2 diabetes mellitus with diabetic neuropathy, unspecified: Secondary | ICD-10-CM | POA: Diagnosis not present

## 2020-10-21 DIAGNOSIS — Z20822 Contact with and (suspected) exposure to covid-19: Secondary | ICD-10-CM | POA: Diagnosis not present

## 2020-10-21 DIAGNOSIS — J9811 Atelectasis: Secondary | ICD-10-CM | POA: Diagnosis present

## 2020-10-21 DIAGNOSIS — R911 Solitary pulmonary nodule: Secondary | ICD-10-CM | POA: Diagnosis not present

## 2020-10-21 DIAGNOSIS — E871 Hypo-osmolality and hyponatremia: Secondary | ICD-10-CM | POA: Diagnosis not present

## 2020-10-21 DIAGNOSIS — J90 Pleural effusion, not elsewhere classified: Secondary | ICD-10-CM | POA: Diagnosis not present

## 2020-10-21 DIAGNOSIS — J929 Pleural plaque without asbestos: Secondary | ICD-10-CM | POA: Diagnosis not present

## 2020-10-21 DIAGNOSIS — I10 Essential (primary) hypertension: Secondary | ICD-10-CM | POA: Diagnosis present

## 2020-10-21 DIAGNOSIS — R918 Other nonspecific abnormal finding of lung field: Secondary | ICD-10-CM | POA: Diagnosis not present

## 2020-10-21 DIAGNOSIS — D75839 Thrombocytosis, unspecified: Secondary | ICD-10-CM | POA: Diagnosis present

## 2020-10-21 DIAGNOSIS — M25511 Pain in right shoulder: Secondary | ICD-10-CM | POA: Diagnosis not present

## 2020-10-21 DIAGNOSIS — E119 Type 2 diabetes mellitus without complications: Secondary | ICD-10-CM | POA: Diagnosis not present

## 2020-10-21 DIAGNOSIS — I2510 Atherosclerotic heart disease of native coronary artery without angina pectoris: Secondary | ICD-10-CM | POA: Diagnosis not present

## 2020-10-21 DIAGNOSIS — R000 Tachycardia, unspecified: Secondary | ICD-10-CM | POA: Diagnosis not present

## 2020-10-21 LAB — COMPREHENSIVE METABOLIC PANEL
ALT: 23 U/L (ref 0–44)
AST: 19 U/L (ref 15–41)
Albumin: 3.4 g/dL — ABNORMAL LOW (ref 3.5–5.0)
Alkaline Phosphatase: 67 U/L (ref 38–126)
Anion gap: 11 (ref 5–15)
BUN: 17 mg/dL (ref 8–23)
CO2: 23 mmol/L (ref 22–32)
Calcium: 8.3 mg/dL — ABNORMAL LOW (ref 8.9–10.3)
Chloride: 94 mmol/L — ABNORMAL LOW (ref 98–111)
Creatinine, Ser: 0.54 mg/dL — ABNORMAL LOW (ref 0.61–1.24)
GFR, Estimated: >60 mL/min (ref >60)
Glucose, Bld: 240 mg/dL — ABNORMAL HIGH (ref 70–99)
Potassium: 3.8 mmol/L (ref 3.5–5.1)
Sodium: 128 mmol/L — ABNORMAL LOW (ref 135–145)
Total Bilirubin: 0.6 mg/dL (ref 0.3–1.2)
Total Protein: 7.7 g/dL (ref 6.5–8.1)

## 2020-10-21 LAB — CBC WITH DIFFERENTIAL/PLATELET
Abs Immature Granulocytes: 0.11 10*3/uL — ABNORMAL HIGH (ref 0.00–0.07)
Basophils Absolute: 0.1 10*3/uL (ref 0.0–0.1)
Basophils Relative: 1 %
Eosinophils Absolute: 0 10*3/uL (ref 0.0–0.5)
Eosinophils Relative: 0 %
HCT: 43.2 % (ref 39.0–52.0)
Hemoglobin: 14.5 g/dL (ref 13.0–17.0)
Immature Granulocytes: 1 %
Lymphocytes Relative: 14 %
Lymphs Abs: 1.1 10*3/uL (ref 0.7–4.0)
MCH: 29.6 pg (ref 26.0–34.0)
MCHC: 33.6 g/dL (ref 30.0–36.0)
MCV: 88.2 fL (ref 80.0–100.0)
Monocytes Absolute: 1.2 10*3/uL — ABNORMAL HIGH (ref 0.1–1.0)
Monocytes Relative: 15 %
Neutro Abs: 5.5 10*3/uL (ref 1.7–7.7)
Neutrophils Relative %: 69 %
Platelets: 487 10*3/uL — ABNORMAL HIGH (ref 150–400)
RBC: 4.9 MIL/uL (ref 4.22–5.81)
RDW: 11.2 % — ABNORMAL LOW (ref 11.5–15.5)
WBC: 8 10*3/uL (ref 4.0–10.5)
nRBC: 0 % (ref 0.0–0.2)

## 2020-10-21 LAB — LACTIC ACID, PLASMA: Lactic Acid, Venous: 1.6 mmol/L (ref 0.5–1.9)

## 2020-10-21 LAB — BRAIN NATRIURETIC PEPTIDE: B Natriuretic Peptide: 30.3 pg/mL (ref 0.0–100.0)

## 2020-10-21 LAB — PROTIME-INR
INR: 1.1 (ref 0.8–1.2)
Prothrombin Time: 14 seconds (ref 11.4–15.2)

## 2020-10-21 LAB — LACTATE DEHYDROGENASE: LDH: 113 U/L (ref 98–192)

## 2020-10-21 LAB — TSH: TSH: 0.441 u[IU]/mL (ref 0.350–4.500)

## 2020-10-21 LAB — RESP PANEL BY RT-PCR (FLU A&B, COVID) ARPGX2
Influenza A by PCR: NEGATIVE
Influenza B by PCR: NEGATIVE
SARS Coronavirus 2 by RT PCR: NEGATIVE

## 2020-10-21 LAB — GLUCOSE, CAPILLARY: Glucose-Capillary: 181 mg/dL — ABNORMAL HIGH (ref 70–99)

## 2020-10-21 LAB — PROTEIN, TOTAL: Total Protein: 6.4 g/dL — ABNORMAL LOW (ref 6.5–8.1)

## 2020-10-21 MED ORDER — IOHEXOL 300 MG/ML  SOLN
75.0000 mL | Freq: Once | INTRAMUSCULAR | Status: AC | PRN
  Administered 2020-10-21: 75 mL via INTRAVENOUS

## 2020-10-21 MED ORDER — SODIUM CHLORIDE 0.9 % IV SOLN: 2.0000 g | INTRAVENOUS | Status: DC

## 2020-10-21 MED ORDER — SODIUM CHLORIDE (PF) 0.9 % IJ SOLN
INTRAMUSCULAR | Status: AC
  Filled 2020-10-21: qty 50

## 2020-10-21 MED ORDER — IPRATROPIUM BROMIDE 0.02 % IN SOLN
0.5000 mg | Freq: Four times a day (QID) | RESPIRATORY_TRACT | Status: DC
  Filled 2020-10-21: qty 2.5

## 2020-10-21 MED ORDER — SODIUM CHLORIDE 0.9 % IV SOLN
1.0000 g | Freq: Once | INTRAVENOUS | Status: AC
  Administered 2020-10-21: 1 g via INTRAVENOUS
  Filled 2020-10-21: qty 10

## 2020-10-21 MED ORDER — KETOROLAC TROMETHAMINE 30 MG/ML IJ SOLN
15.0000 mg | Freq: Once | INTRAMUSCULAR | Status: AC
  Administered 2020-10-21: 15 mg via INTRAVENOUS
  Filled 2020-10-21: qty 1

## 2020-10-21 MED ORDER — ACETAMINOPHEN 650 MG RE SUPP: 650.0000 mg | Freq: Four times a day (QID) | RECTAL | Status: DC | PRN

## 2020-10-21 MED ORDER — LEVALBUTEROL HCL 0.63 MG/3ML IN NEBU
0.6300 mg | INHALATION_SOLUTION | Freq: Four times a day (QID) | RESPIRATORY_TRACT | Status: DC
  Filled 2020-10-21: qty 3

## 2020-10-21 MED ORDER — HYDRALAZINE HCL 20 MG/ML IJ SOLN: 10.0000 mg | Freq: Four times a day (QID) | INTRAMUSCULAR | Status: DC | PRN

## 2020-10-21 MED ORDER — SODIUM CHLORIDE 0.9 % IV SOLN
500.0000 mg | Freq: Once | INTRAVENOUS | Status: AC
  Administered 2020-10-21: 500 mg via INTRAVENOUS
  Filled 2020-10-21: qty 500

## 2020-10-21 MED ORDER — LISINOPRIL 20 MG PO TABS
40.0000 mg | ORAL_TABLET | Freq: Every day | ORAL | Status: DC
  Administered 2020-10-22 – 2020-10-31 (×11): 40 mg via ORAL
  Filled 2020-10-21 (×11): qty 2

## 2020-10-21 MED ORDER — OXYCODONE HCL 5 MG PO TABS
5.0000 mg | ORAL_TABLET | ORAL | Status: DC | PRN
  Administered 2020-10-29: 5 mg via ORAL
  Filled 2020-10-21 (×2): qty 1

## 2020-10-21 MED ORDER — SODIUM CHLORIDE 0.9 % IV BOLUS
500.0000 mL | Freq: Once | INTRAVENOUS | Status: AC
Start: 2020-10-21 — End: 2020-10-21
  Administered 2020-10-21: 500 mL via INTRAVENOUS

## 2020-10-21 MED ORDER — POLYETHYLENE GLYCOL 3350 17 G PO PACK: 17.0000 g | PACK | Freq: Every day | ORAL | Status: DC | PRN

## 2020-10-21 MED ORDER — SODIUM CHLORIDE 0.9 % IV SOLN
3.0000 g | Freq: Four times a day (QID) | INTRAVENOUS | Status: DC
  Administered 2020-10-22 – 2020-10-26 (×18): 3 g via INTRAVENOUS
  Filled 2020-10-21 (×5): qty 3
  Filled 2020-10-21 (×2): qty 8
  Filled 2020-10-21 (×3): qty 3
  Filled 2020-10-21: qty 8
  Filled 2020-10-21 (×2): qty 3
  Filled 2020-10-21: qty 8
  Filled 2020-10-21 (×2): qty 3
  Filled 2020-10-21: qty 8
  Filled 2020-10-21: qty 3
  Filled 2020-10-21: qty 8

## 2020-10-21 MED ORDER — INSULIN ASPART 100 UNIT/ML IJ SOLN
0.0000 [IU] | Freq: Every day | INTRAMUSCULAR | Status: DC
  Administered 2020-10-22: 3 [IU] via SUBCUTANEOUS
  Administered 2020-10-23: 2 [IU] via SUBCUTANEOUS
  Administered 2020-10-25: 3 [IU] via SUBCUTANEOUS
  Administered 2020-10-27 – 2020-10-28 (×2): 2 [IU] via SUBCUTANEOUS

## 2020-10-21 MED ORDER — FENTANYL CITRATE (PF) 100 MCG/2ML IJ SOLN
50.0000 ug | Freq: Once | INTRAMUSCULAR | Status: DC
  Filled 2020-10-21 (×2): qty 2

## 2020-10-21 MED ORDER — IPRATROPIUM-ALBUTEROL 0.5-2.5 (3) MG/3ML IN SOLN: 3.0000 mL | Freq: Four times a day (QID) | RESPIRATORY_TRACT | Status: DC | PRN

## 2020-10-21 MED ORDER — ENOXAPARIN SODIUM 40 MG/0.4ML IJ SOSY
40.0000 mg | PREFILLED_SYRINGE | INTRAMUSCULAR | Status: DC
  Administered 2020-10-22 – 2020-10-30 (×10): 40 mg via SUBCUTANEOUS
  Filled 2020-10-21 (×10): qty 0.4

## 2020-10-21 MED ORDER — HYDROXYZINE HCL 10 MG PO TABS: 10.0000 mg | ORAL_TABLET | Freq: Every evening | ORAL | Status: DC | PRN

## 2020-10-21 MED ORDER — BISACODYL 10 MG RE SUPP: 10.0000 mg | Freq: Every day | RECTAL | Status: DC | PRN

## 2020-10-21 MED ORDER — INSULIN ASPART 100 UNIT/ML IJ SOLN
0.0000 [IU] | Freq: Three times a day (TID) | INTRAMUSCULAR | Status: DC
  Administered 2020-10-22: 2 [IU] via SUBCUTANEOUS
  Administered 2020-10-22: 3 [IU] via SUBCUTANEOUS
  Administered 2020-10-22 – 2020-10-23 (×2): 5 [IU] via SUBCUTANEOUS
  Administered 2020-10-23: 2 [IU] via SUBCUTANEOUS
  Administered 2020-10-24: 5 [IU] via SUBCUTANEOUS
  Administered 2020-10-24: 2 [IU] via SUBCUTANEOUS
  Administered 2020-10-24: 3 [IU] via SUBCUTANEOUS
  Administered 2020-10-25: 2 [IU] via SUBCUTANEOUS
  Administered 2020-10-25: 1 [IU] via SUBCUTANEOUS
  Administered 2020-10-25: 2 [IU] via SUBCUTANEOUS
  Administered 2020-10-26: 3 [IU] via SUBCUTANEOUS
  Administered 2020-10-26: 5 [IU] via SUBCUTANEOUS
  Administered 2020-10-26: 1 [IU] via SUBCUTANEOUS
  Administered 2020-10-27 (×2): 2 [IU] via SUBCUTANEOUS
  Administered 2020-10-27: 3 [IU] via SUBCUTANEOUS
  Administered 2020-10-28 (×2): 2 [IU] via SUBCUTANEOUS
  Administered 2020-10-28: 5 [IU] via SUBCUTANEOUS
  Administered 2020-10-29: 1 [IU] via SUBCUTANEOUS
  Administered 2020-10-29: 5 [IU] via SUBCUTANEOUS
  Administered 2020-10-30: 1 [IU] via SUBCUTANEOUS
  Administered 2020-10-30: 3 [IU] via SUBCUTANEOUS
  Administered 2020-10-31: 1 [IU] via SUBCUTANEOUS
  Administered 2020-10-31: 2 [IU] via SUBCUTANEOUS

## 2020-10-21 MED ORDER — AMLODIPINE BESYLATE 10 MG PO TABS
10.0000 mg | ORAL_TABLET | Freq: Every day | ORAL | Status: DC
  Administered 2020-10-22 – 2020-10-31 (×11): 10 mg via ORAL
  Filled 2020-10-21 (×11): qty 1

## 2020-10-21 MED ORDER — GUAIFENESIN ER 600 MG PO TB12
600.0000 mg | ORAL_TABLET | Freq: Two times a day (BID) | ORAL | Status: DC
  Administered 2020-10-22 – 2020-10-31 (×20): 600 mg via ORAL
  Filled 2020-10-21 (×20): qty 1

## 2020-10-21 MED ORDER — SODIUM CHLORIDE 0.9 % IV SOLN: 500.0000 mg | INTRAVENOUS | Status: DC

## 2020-10-21 MED ORDER — ONDANSETRON HCL 4 MG/2ML IJ SOLN: 4.0000 mg | Freq: Four times a day (QID) | INTRAMUSCULAR | Status: DC | PRN

## 2020-10-21 MED ORDER — ONDANSETRON HCL 4 MG PO TABS: 4.0000 mg | ORAL_TABLET | Freq: Four times a day (QID) | ORAL | Status: DC | PRN

## 2020-10-21 MED ORDER — ACETAMINOPHEN 325 MG PO TABS
650.0000 mg | ORAL_TABLET | Freq: Four times a day (QID) | ORAL | Status: DC | PRN
  Administered 2020-10-25 – 2020-10-29 (×4): 650 mg via ORAL
  Filled 2020-10-21 (×4): qty 2

## 2020-10-21 NOTE — ED Provider Notes (Signed)
[facility] DEPT Provider Note   CSN: [id number] Arrival date & time: 10/21/20  1358     History Chief Complaint  Patient presents with   Shortness of Breath   Chest Pain   Shoulder Pain    Edward [name] is a 65 y.o. male.   Shortness of Breath Associated symptoms: chest pain and fever   Associated symptoms: no abdominal pain and no rash   Chest Pain Associated symptoms: fatigue, fever, shortness of breath and weakness   Associated symptoms: no abdominal pain and no back pain   Shoulder Pain Associated symptoms: fatigue and fever   Associated symptoms: no back pain   Patient speaks [place] and was translated by daughter.  Child psychotherapist offered but refused.  Reportedly had a fall.  Went to Ortho and had x-ray that showed pleural effusion.  Did not know he had a fever.  Has been somewhat short of breath around 3 days but has been feeling little bad for around 2 weeks.  States he has been losing weight over those 2 weeks also.  No cancer history.  No TB history.  No known recent contacts.  Is vaccinated for COVID.  Also has dull chest pain.  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  Also there is a thyroid nodule that is being monitored but not thought to be malignant.    Past Medical History:  Diagnosis Date   Diabetes mellitus without complication ([place])    Hypertension     Patient Active Problem List   Diagnosis Date Noted   Vitamin B12 deficiency 04/12/2018   Scapular dysfunction 05/26/2017   Left knee pain 02/18/2016   Type 2 diabetes mellitus ([place]) 08/28/2015   Essential hypertension 08/28/2015   Routine general medical examination at a health care facility 08/28/2015    Past Surgical History:  Procedure Laterality Date   TUMOR REMOVAL         Family History  Problem Relation Age of Onset   Prostate cancer Father    Heart attack Paternal Grandfather     Social History    Tobacco Use   Smoking status: Never   Smokeless tobacco: Never  Vaping Use   Vaping Use: Never used  Substance Use Topics   Alcohol use: No    Alcohol/week: 0.0 standard drinks   Drug use: No    Home Medications Prior to Admission medications   Medication Sig Start Date End Date Taking? Authorizing Provider  amLODipine (NORVASC) 10 MG tablet TAKE 1 TABLET(10 MG) BY MOUTH DAILY 07/11/20   Maximiano Coss, NP  Canagliflozin-metFORMIN HCl ER (INVOKAMET XR) [phone] MG TB24 Take 2 tablets by mouth daily. 07/11/20   Maximiano Coss, NP  diclofenac Sodium (VOLTAREN) 1 % GEL Apply 2 g topically 4 (four) times daily. 07/11/20   Maximiano Coss, NP  Dulaglutide 3 MG/0.5ML SOPN Inject 3 mg into the skin once a week. 07/11/20   Maximiano Coss, NP  glucose blood (ONE TOUCH ULTRA TEST) test strip Use as instructed 07/11/20   Maximiano Coss, NP  hydrOXYzine (ATARAX/VISTARIL) 10 MG tablet Take 1-2 tablets (10-20 mg total) by mouth at bedtime as needed. 07/11/20   Maximiano Coss, NP  lisinopril (ZESTRIL) 40 MG tablet Take 1 tablet (40 mg total) by mouth daily. 07/11/20   Maximiano Coss, NP  Misc Natural Products (OSTEO BI-FLEX ADV DOUBLE ST PO) Take by mouth.    [provider]    Allergies  Patient has no known allergies.  Review of Systems   Review of Systems  Constitutional:  Positive for fatigue, fever and unexpected weight change. Negative for appetite change.  HENT:  Negative for congestion.   Respiratory:  Positive for shortness of breath.   Cardiovascular:  Positive for chest pain.  Gastrointestinal:  Negative for abdominal pain.  Genitourinary:  Negative for flank pain.  Musculoskeletal:  Negative for back pain.  Skin:  Negative for rash.  Neurological:  Positive for weakness.   Physical Exam Updated Vital Signs BP 133/86   Pulse (!) 108   Temp (!) 101.5 F (38.6 C) (Oral)   Resp (!) 28   Ht 5\' 2"  (1.575 m)   Wt 61.7 kg   SpO2 95%   BMI 24.87 kg/m   Physical  Exam Vitals reviewed.  Constitutional:      Appearance: He is well-developed.  HENT:     Head: Atraumatic.  Cardiovascular:     Rate and Rhythm: Tachycardia present.  Pulmonary:     Breath sounds: Decreased breath sounds present.     Comments: Decreased breath sounds on the right. Chest:     Chest wall: No tenderness.  Abdominal:     Tenderness: There is no abdominal tenderness.  Musculoskeletal:     Cervical back: Normal range of motion.     Right lower leg: No edema.     Left lower leg: No edema.  Skin:    General: Skin is warm.     Capillary Refill: Capillary refill takes less than 2 seconds.  Neurological:     Mental Status: He is alert and oriented to person, place, and time.    ED Results / Procedures / Treatments   Labs (all labs ordered are listed, but only abnormal results are displayed) Labs Reviewed  CBC WITH DIFFERENTIAL/PLATELET - Abnormal; Notable for the following components:      Result Value   RDW 11.2 (*)    Platelets 487 (*)    Monocytes Absolute 1.2 (*)    Abs Immature Granulocytes 0.11 (*)    All other components within normal limits  COMPREHENSIVE METABOLIC PANEL - Abnormal; Notable for the following components:   Sodium 128 (*)    Chloride 94 (*)    Glucose, Bld 240 (*)    Creatinine, Ser 0.54 (*)    Calcium 8.3 (*)    Albumin 3.4 (*)    All other components within normal limits  CULTURE, BLOOD (ROUTINE X 2)  CULTURE, BLOOD (ROUTINE X 2)  RESP PANEL BY RT-PCR (FLU A&B, COVID) ARPGX2  LACTIC ACID, PLASMA  PROTIME-INR  LACTIC ACID, PLASMA    EKG None  Radiology DG Chest 2 View  Result Date: 10/21/2020 CLINICAL DATA:  Short of breath EXAM: CHEST - 2 VIEW COMPARISON:  CT 08/04/2018 FINDINGS: Left lung is grossly clear. Completely opacified right hemithorax. Cardiomediastinal silhouette probably within normal limits. No pneumothorax. IMPRESSION: Completely opacified right hemithorax likely due to pleural effusion and underlying airspace  disease. Electronically Signed   By: Donavan Foil M.D.   On: 10/21/2020 15:42    Procedures Procedures   Medications Ordered in ED Medications  azithromycin (ZITHROMAX) 500 mg in sodium chloride 0.9 % 250 mL IVPB (500 mg Intravenous New Bag/Given 10/21/20 1656)  fentaNYL (SUBLIMAZE) injection 50 mcg (has no administration in time range)  sodium chloride 0.9 % bolus 500 mL (has no administration in time range)  cefTRIAXone (ROCEPHIN) 1 g in sodium chloride 0.9 % 100 mL IVPB (1  g Intravenous New Bag/Given (Non-Interop) 10/21/20 1654)    ED Course  I have reviewed the triage vital signs and the nursing notes.  Pertinent labs & imaging results that were available during my care of the patient were reviewed by me and considered in my medical decision making (see chart for details).    MDM Rules/Calculators/A&P                          Patient sent in for some widening on x-ray from orthopedic surgery.  Found to have large right-sided pleural effusion.  Also found to have fever.  Lab work reassuring.  Has normal white count and normal lactic acid.  Discussed with Markus Daft from interventional radiology.  They feel as if they can do a thoracentesis tomorrow on him.  I have started antibiotics to cover potentially community-acquired pneumonia.  No malignancy history.  No TB history.  Mild tachycardia mild tachypnea but not hypoxic.  No hypotension.  Will admit to internal medicine for further work-up of the effusion.  Patient is vaccinated for COVID. Final Clinical Impression(s) / ED Diagnoses Final diagnoses:  Fever, unspecified fever cause  Pleural effusion    Rx / DC Orders ED Discharge Orders     None        Davonna Belling, MD 10/21/20 1726

## 2020-10-21 NOTE — ED Triage Notes (Signed)
Patient went to [facility] today for a right shoulder x-ray after a fall yesterday.f patient states he had right shoulder pain x 3 days prior to the fall. Patient had an x-ray of the right shoulder and it was noted that the patient had f"fluid on his lungs" according to the patient's daughter.  Patient has had SOB x 3 days. Patient also c/o chest pain ,but can not remember when, but states recently.

## 2020-10-21 NOTE — ED Notes (Signed)
Patient transported to CT 

## 2020-10-21 NOTE — H&P (Addendum)
History and Physical    Edward [name] LPF:[id number] DOB: [birth year] DOA: 10/21/2020  PCP: Pcp, No   Patient coming from: Home  Chief Complaint: Right Shoulder Pain; SOB   HPI: Edward [name] is a 65 y.o. male with medical history significant for but not limited too hypertension, diabetes mellitus type 2, history of benign tumor removal on his left kidney, history of thyroid nodules that is currently being expectantly managed, as well as other comorbidities who presents with a chief complaint of right shoulder pain and worsening shortness of breath for the last 3 to 4 days.  Patient states that he began for breath about 2 weeks ago and progressively started getting worse and worse in the last 3 to 4 days.  Had a mechanical fall and fell on his right shoulder so he went to go get a x-ray of the shoulder and was found to have a very large right-sided pleural effusion.  Because of this he was directed to go to the emergency room and at the ED he is found to be febrile, tachycardic and tachypneic along with generally weak.  Patient has had intermittent chest pain.  Currently he is a [place] speaker and refused to video translator slow transition was done by the patient's daughter who was at bedside.  When he came to the ED he did not know that he had a fever but he has been short of breath for the last 3 to 4 days and feeling bad for around 2 weeks.  Is also been losing weight in the last 2 weeks no history of cancer or TB.  His PCP recently moved away in March.  Because of his large pleural effusion [facility] was asked admit this patient for further evaluation recommendations.  Interventional radiology Dr. Orvil Feil was consulted and unfortunately they were not able to do the thoracentesis today.  Because this pulmonary was consulted by myself and they are seeing the patient for a thoracentesis.  ED Course: In the ED patient had basic blood work done as well as a chest x-ray.  Review of Systems: As per HPI otherwise  all other systems reviewed and negative.  Patient had an EKG, blood cultures and a COVID test which was negative.  I ordered a CT scan of the chest with contrast that showed "Large right-sided pleural effusion with complete atelectasis of the right lung and mild mediastinal shift of contents to the left."  Past Medical History:  Diagnosis Date   Diabetes mellitus without complication ([place])    Hypertension    Past Surgical History:  Procedure Laterality Date   TUMOR REMOVAL     SOCIAL HISTORY  reports that he has never smoked. He has never used smokeless tobacco. He reports that he does not drink alcohol and does not use drugs.   ALLERGIES No Known Allergies  Family History  Problem Relation Age of Onset   Prostate cancer Father    Heart attack Paternal Grandfather    Prior to Admission medications   Medication Sig Start Date End Date Taking? Authorizing Provider  amLODipine (NORVASC) 10 MG tablet TAKE 1 TABLET(10 MG) BY MOUTH DAILY Patient taking differently: Take 10 mg by mouth daily. 07/11/20  Yes Maximiano Coss, NP  Canagliflozin-metFORMIN HCl ER (INVOKAMET XR) [phone] MG TB24 Take 2 tablets by mouth daily. 07/11/20  Yes Maximiano Coss, NP  diclofenac Sodium (VOLTAREN) 1 % GEL Apply 2 g topically 4 (four) times daily. 07/11/20  Yes Maximiano Coss, NP  Dulaglutide 3 MG/0.5ML [facility]  Inject 3 mg into the skin once a week. 07/11/20  Yes Maximiano Coss, NP  lisinopril (ZESTRIL) 40 MG tablet Take 1 tablet (40 mg total) by mouth daily. 07/11/20  Yes Maximiano Coss, NP  glucose blood (ONE TOUCH ULTRA TEST) test strip Use as instructed 07/11/20   Maximiano Coss, NP  hydrOXYzine (ATARAX/VISTARIL) 10 MG tablet Take 1-2 tablets (10-20 mg total) by mouth at bedtime as needed. Patient taking differently: Take 10-20 mg by mouth at bedtime as needed for anxiety. 07/11/20   Maximiano Coss, NP    Physical Exam: Vitals:   10/21/20 1501 10/21/20 1630 10/21/20 1700 10/21/20 1730  BP:  (!) 139/93  133/86 109/81  Pulse:  (!) 109 (!) 108 (!) 107  Resp:  (!) 25 (!) 28 (!) 30  Temp:      TempSrc:      SpO2:  96% 95% 95%  Weight: 61.7 kg     Height: 5\' 2"  (1.575 m)      Constitutional: WN/WD Thin [place] Male in mild respiratory distress but appears calm Eyes: Lids and conjunctivae normal, sclerae anicteric  ENMT: External Ears, Nose appear normal. Grossly normal hearing.  Neck: Appears normal, supple, no cervical masses, normal ROM, no appreciable thyromegaly; no JVD Respiratory: Diminished to auscultation bilaterally with coarse breath sounds worse on the Right compared to the Left , no wheezing, rales, rhonchi or crackles. Slightly increased respiratory effort and work of breathing but is not wearing any supplemental O2 via [place] Cardiovascular: Tachycardic Rate but Regular Rhythm, no murmurs / rubs / gallops. No Edema Abdomen: Soft, non-tender, non-distended. Bowel sounds positive.  GU: Deferred. Musculoskeletal: No clubbing / cyanosis of digits/nails. No joint deformity upper and lower extremities. Skin: No rashes, lesions, ulcers on a limited skin evaluation. No induration; Warm and dry.  Neurologic: CN 2-12 grossly intact with no focal deficits. Romberg sign and cerebellar reflexes not assessed.  Psychiatric: Normal judgment and insight. Alert and oriented x 3. Normal mood and appropriate affect.   Labs on Admission: I have personally reviewed following labs and imaging studies  CBC: Recent Labs  Lab 10/21/20 1626  WBC 8.0  NEUTROABS 5.5  HGB 14.5  HCT 43.2  MCV 88.2  PLT 694*   Basic Metabolic Panel: Recent Labs  Lab 10/21/20 1626  NA 128*  K 3.8  CL 94*  CO2 23  GLUCOSE 240*  BUN 17  CREATININE 0.54*  CALCIUM 8.3*   GFR: Estimated Creatinine Clearance: 71.1 mL/min (A) (by C-G formula based on SCr of 0.54 mg/dL (L)). Liver Function Tests: Recent Labs  Lab 10/21/20 1626  AST 19  ALT 23  ALKPHOS 67  BILITOT 0.6  PROT 7.7  ALBUMIN 3.4*   No results  for input(s): LIPASE, AMYLASE in the last 168 hours. No results for input(s): AMMONIA in the last 168 hours. Coagulation Profile: Recent Labs  Lab 10/21/20 1626  INR 1.1   Cardiac Enzymes: No results for input(s): CKTOTAL, CKMB, CKMBINDEX, TROPONINI in the last 168 hours. BNP (last 3 results) No results for input(s): PROBNP in the last 8760 hours. HbA1C: No results for input(s): HGBA1C in the last 72 hours. CBG: No results for input(s): GLUCAP in the last 168 hours. Lipid Profile: No results for input(s): CHOL, HDL, LDLCALC, TRIG, CHOLHDL, LDLDIRECT in the last 72 hours. Thyroid Function Tests: No results for input(s): TSH, T4TOTAL, FREET4, T3FREE, THYROIDAB in the last 72 hours. Anemia Panel: No results for input(s): VITAMINB12, FOLATE, FERRITIN, TIBC, IRON, RETICCTPCT in the last 72  hours. Urine analysis: No results found for: COLORURINE, APPEARANCEUR, LABSPEC, PHURINE, GLUCOSEU, HGBUR, BILIRUBINUR, KETONESUR, PROTEINUR, UROBILINOGEN, NITRITE, LEUKOCYTESUR Sepsis Labs: !!!!!!!!!!!!!!!!!!!!!!!!!!!!!!!!!!!!!!!!!!!! @LABRCNTIP (procalcitonin:4,lacticidven:4) )No results found for this or any previous visit (from the past 240 hour(s)).   Radiological Exams on Admission: DG Chest 2 View  Result Date: 10/21/2020 CLINICAL DATA:  Short of breath EXAM: CHEST - 2 VIEW COMPARISON:  CT 08/04/2018 FINDINGS: Left lung is grossly clear. Completely opacified right hemithorax. Cardiomediastinal silhouette probably within normal limits. No pneumothorax. IMPRESSION: Completely opacified right hemithorax likely due to pleural effusion and underlying airspace disease. Electronically Signed   By: Donavan Foil M.D.   On: 10/21/2020 15:42   CT CHEST W CONTRAST  Result Date: 10/21/2020 CLINICAL DATA:  Shortness of breath with right shoulder pain EXAM: CT CHEST WITH CONTRAST TECHNIQUE: Multidetector CT imaging of the chest was performed during intravenous contrast administration. CONTRAST:  25mL OMNIPAQUE  IOHEXOL 300 MG/ML  SOLN COMPARISON:  Chest x-ray 10/21/2020 FINDINGS: Cardiovascular: Nonaneurysmal aorta. Normal cardiac size. No pericardial effusion. Mild coronary vascular calcification. Mediastinum/Nodes: No suspicious thyroid mass. Slight tracheal deviation to the left. No suspicious adenopathy. Esophagus within normal limits Lungs/Pleura: Large right-sided water density pleural effusion. Complete atelectasis of the right lung. Mild shift of mediastinal contents to the left. Upper Abdomen: No acute abnormality. 14 mm hyperenhancing focus within the left hepatic lobe. Musculoskeletal: No acute or suspicious osseous abnormality IMPRESSION: 1. Large right-sided pleural effusion with complete atelectasis of the right lung and mild mediastinal shift of contents to the left. 2. 14 mm possible hyperenhancing nodule in the left hepatic lobe. When the patient is clinically stable and able to follow directions and hold their breath (preferably as an outpatient) further evaluation with dedicated abdominal MRI should be considered. Aortic Atherosclerosis (ICD10-I70.0). Electronically Signed   By: Donavan Foil M.D.   On: 10/21/2020 18:20    EKG: Independently reviewed. Showed a Sinus Tachycardia of 110 and Qtc was 420  Assessment/Plan Active Problems:   Pleural effusion on right  SIRS from Large Right Pleural Effusion, poA -Admit to Progressive Inpatient  -Patient was Tachypenic, Tachycardic, Febrile but had no Leukocytosis, No Lactic Acidosis; NOT Hypoxic  -COVID Testing was Negative -Blood Cx x2 Ordered  -DG Chest showed "Completely opacified right hemithorax likely due to pleural effusion and underlying airspace disease." -CT Scan of the Chest w/ Contrast showed "Large right-sided pleural effusion with complete atelectasis of the right lung and mild mediastinal shift of contents to the left. 14 mm possible hyperenhancing nodule in the left hepatic lobe. When the patient is clinically stable and able to  follow directions and hold their breath (preferably as an outpatient) further evaluation with dedicated abdominal MRI should be considered" -Interventional radiology was consulted for a thoracentesis however unfortunately cannot be done; because of his very large pleural effusion pulmonary is consulted and they are going to likely do a bedside thoracentesis later on this evening with fluid analysis -Unclear Etiology but cannot r/o Parapneumonic Effusion vs. Malignant Effusion -Empiric Coverage with IV Ceftriaxone and IV Azithromycin -Check PCT in the AM  -Xopenex/Atrovent, Flutter Valve, Incentive, Guaifenesin -Supportive Care and Pulmonary Hygeine -Repeat CXR in the AM  -Follow Pulmonary Recc's -Patient will need PT/OT to evaluate and treat as well as an Ambulator Home O2 Screen  Liver Nodule -Was noted to be 14 mm possible Hyperenhancing Nodule in the Left Hepatic Lobe -Outpatient MRI  Hyponatremia -Is given 500 mL bolus in the ED -Need to continue monitor and trend and could be related  to lung pathology -Repeat CMP in the AM   Thrombocytosis -Likely reactive -The patient platelet count is now 487 -Continue and trend and repeat CMP in a.m.  HTN -C/w Amlodipine and Lisinopril -Continue to Monitor BP per Protocol -Last BP was 130/88  Diabetes Mellitus Type 2 -Hold Home Canagliflozin-Metformin and Dulaglutide -Place on Sensitive Novolog SSI AC/HS -Continue to Monitor Blood Sugars per Protocol.  -Last HbA1c was 7.8; Repeat HbA1c in the AM   Right Shoulder Pain -Try to get [facility] from [facility] which he had  -IF unable to obtain may repeat here -Will need to start Diclofenac Gel for Shoulder if necessary -Will get PT/OT  DVT prophylaxis: Enoxaparin 40 mg sq q24h Code Status: FULL CODE  Family Communication: Discussed with Daughter at bedside  Disposition Plan: Pending further improvement and clearance by Pulm and evaluation by PT/OT Consults called: Pulmonary; IR was  consulted by EDP Admission status: Inpatient Progressive   Severity of Illness: The appropriate patient status for this patient is INPATIENT. Inpatient status is judged to be reasonable and necessary in order to provide the required intensity of service to ensure the patient's safety. The patient's presenting symptoms, physical exam findings, and initial radiographic and laboratory data in the context of their chronic comorbidities is felt to place them at high risk for further clinical deterioration. Furthermore, it is not anticipated that the patient will be medically stable for discharge from the hospital within 2 midnights of admission. The following factors support the patient status of inpatient.   " The patient's presenting symptoms include SOB and Fatigue. " The worrisome physical exam findings include Diminished Breath sounds worse on the Right compared to the left with slightly increased work of breathing . " The initial radiographic and laboratory data are worrisome because of Extensive Right Plerual Effusion. " The chronic co-morbidities include HTN, DMType 2.   * I certify that at the point of admission it is my clinical judgment that the patient will require inpatient hospital care spanning beyond 2 midnights from the point of admission due to high intensity of service, high risk for further deterioration and high frequency of surveillance required.Kerney Elbe, D.O. [facility] PAGER is on [place]  If 7PM-7AM, please contact night-coverage [email or web address]  10/21/2020, 6:52 PM

## 2020-10-21 NOTE — Procedures (Signed)
       30f Pigtail Chest Tube Procedure Note   Indication: large right sided pleural effusion with mass effect Operator: Ayesha Rumpf   PROCEDURE: Insertion of a 14 french pigtail catheter in the right pleural space.  PROCEDURE DETAIL: After obtaining informed consent, a time out was called. the patient was then placed in semi recumbent position. Ultrasound of the right hemithorax was performed. Large free flowing pleural effusion was identified. The chest was then prepped and draped in a usual fashion. Lidocaine 1% was injected for local anesthesia. A introducer needle was then inserted into the pleural space. Yellow colored fluid was aspirated. A guidewire was then threaded through the introducer needle. The needle was then removed. I dilator was then threaded over the guidewire and the subcutaneous tissues was dilated. A 62f pigtail catheter was threaded over the guidewire and into the pleural space at the right midaxillary line. The guidewire was then removed. The catheter was sutured in place and dressing was applied.   The patient tolerated the procedure well. No complications.   The fluid was sent for analysis.

## 2020-10-21 NOTE — Plan of Care (Signed)
  Problem: Education: Goal: Knowledge of General Education information will improve Description Including pain rating scale, medication(s)/side effects and non-pharmacologic comfort measures Outcome: Progressing   Problem: Health Behavior/Discharge Planning: Goal: Ability to manage health-related needs will improve Outcome: Progressing   Problem: Clinical Measurements: Goal: Ability to maintain clinical measurements within normal limits will improve Outcome: Progressing Goal: Will remain free from infection Outcome: Progressing Goal: Diagnostic test results will improve Outcome: Progressing Goal: Respiratory complications will improve Outcome: Progressing Goal: Cardiovascular complication will be avoided Outcome: Progressing   Problem: Activity: Goal: Risk for activity intolerance will decrease Outcome: Progressing   Problem: Coping: Goal: Level of anxiety will decrease Outcome: Progressing   Problem: Pain Managment: Goal: General experience of comfort will improve Outcome: Progressing   Problem: Safety: Goal: Ability to remain free from injury will improve Outcome: Progressing   

## 2020-10-21 NOTE — Consult Note (Signed)
NAME:  Edward [name], MRN:  [id number], DOB:  03/22/1956, LOS: 0 ADMISSION DATE:  10/21/2020, CONSULTATION DATE: 10/21/2020 REFERRING MD:  Dr. Alvino Chapel, CHIEF COMPLAINT: Large right pleural effusion  History of Present Illness:  Edward [name] is a 65 y.o. male with a past medical history significant for diabetes and hypertension who presented to the emergency department with complaints of shortness of breath, chest pain, and shoulder pain.  Per history patient recently suffered a minor fall with eventual development of right shoulder pain.  Due to persistent pain patient was referred for orthopedic consult.  At outpatient orthopedic appointment shoulder x-ray was obtained and revealed significant pleural effusion.  Daughter denies any significant fever prior to admission.  Family denies any family history of cancer.  Patient denies any night sweats this prompted further recommendation for ED visit.  On ED arrival patient was seen febrile with temperature 101.5, tachycardic with a respiratory rate of 22, and tachycardic with a heart rate of 116.  Oxygen saturations are appropriate.  Thus chest x-ray and chest confirmed very large right-sided pleural effusion with complete atelectasis of right lung with mild mediastinal shift, additionally there is a 14 mm possible hyperenhancing nodule of the left hepatic lobe.  PCCM consulted for need of thoracentesis.  Pertinent  Medical History  Diabetes Hypertension  Significant Hospital Events:  Admitted for shortness of breath, right shoulder pain, and chest pain found To right pleural effusion with complete atelectasis of right lung with mild mediastinal shift  Interim History / Subjective:  As above  Objective   Blood pressure 109/81, pulse (!) 107, temperature (!) 101.5 F (38.6 C), temperature source Oral, resp. rate (!) 30, height 5\' 2"  (1.575 m), weight 61.7 kg, SpO2 95 %.        Intake/Output Summary (Last 24 hours) at 10/21/2020 1901 Last data filed  at 10/21/2020 1817 Gross per 24 hour  Intake 350 ml  Output --  Net 350 ml   Filed Weights   10/21/20 1501  Weight: 61.7 kg    Examination: General: Very pleasant middle-age male sitting up on ED stretcher with mild respiratory discomfort but in no acute distress HEENT: [place]/AT, MM pink/moist, PERRL,  Neuro: Non-English-speaking, alert and oriented, no CV: s1s2 regular rate and rhythm, no murmur, rubs, or gallops,  PULM: Very very diminished breath sounds, Clear to Left, Mild Increased Work of Breathing Tachypneic GI: soft, bowel sounds active in all 4 quadrants, non-tender, non-distended Extremities: warm/dry, no edema  Skin: no rashes or lesions  Labs/imaging that I havepersonally reviewed   6/21 CT scan chest large right pleural effusion with complete atelectasis of the right lung and mild mediastinal shift contents to the left, 14 mm possible hyperenhancing nodule in the left hepatic lobe  Resolved Hospital Problem list     Assessment & Plan:  Large right pleural effusion -Unknown etiology cannot rule out parapneumonic effusion or oncological process P: Thoracentesis with pleural fluid and studies obtained Pulmonary hygiene Elevate head of bed Supportive care   Best Practice  Per primary   Labs   CBC: Recent Labs  Lab 10/21/20 1626  WBC 8.0  NEUTROABS 5.5  HGB 14.5  HCT 43.2  MCV 88.2  PLT 487*    Basic Metabolic Panel: Recent Labs  Lab 10/21/20 1626  NA 128*  K 3.8  CL 94*  CO2 23  GLUCOSE 240*  BUN 17  CREATININE 0.54*  CALCIUM 8.3*   GFR: Estimated Creatinine Clearance: 71.1 mL/min (A) (by C-G formula  based on SCr of 0.54 mg/dL (L)). Recent Labs  Lab 10/21/20 1626  WBC 8.0  LATICACIDVEN 1.6    Liver Function Tests: Recent Labs  Lab 10/21/20 1626  AST 19  ALT 23  ALKPHOS 67  BILITOT 0.6  PROT 7.7  ALBUMIN 3.4*   No results for input(s): LIPASE, AMYLASE in the last 168 hours. No results for input(s): AMMONIA in the last 168  hours.  ABG No results found for: PHART, PCO2ART, PO2ART, HCO3, TCO2, ACIDBASEDEF, O2SAT   Coagulation Profile: Recent Labs  Lab 10/21/20 1626  INR 1.1    Cardiac Enzymes: No results for input(s): CKTOTAL, CKMB, CKMBINDEX, TROPONINI in the last 168 hours.  HbA1C: Hemoglobin A1C  Date/Time Value Ref Range Status  07/11/2020 10:24 AM 7.8 (A) 4.0 - 5.6 % Final  04/11/2020 11:49 AM 8.5 (A) 4.0 - 5.6 % Final   Hgb A1c MFr Bld  Date/Time Value Ref Range Status  04/12/2018 09:48 AM 9.2 (H) 4.6 - 6.5 % Final    Comment:    Glycemic Control Guidelines for People with Diabetes:Non Diabetic:  <6%Goal of Therapy: <7%Additional Action Suggested:  >8%   02/10/2018 09:42 AM 7.4 (H) 4.6 - 6.5 % Final    Comment:    Glycemic Control Guidelines for People with Diabetes:Non Diabetic:  <6%Goal of Therapy: <7%Additional Action Suggested:  >8%     CBG: No results for input(s): GLUCAP in the last 168 hours.  Review of Systems:   Please see the history of present illness. All other systems reviewed and are negative   Past Medical History:  He,  has a past medical history of Diabetes mellitus without complication ([place]) and Hypertension.   Surgical History:   Past Surgical History:  Procedure Laterality Date   TUMOR REMOVAL       Social History:   reports that he has never smoked. He has never used smokeless tobacco. He reports that he does not drink alcohol and does not use drugs.   Family History:  His family history includes Heart attack in his paternal grandfather; Prostate cancer in his father.   Allergies No Known Allergies   Home Medications  Prior to Admission medications   Medication Sig Start Date End Date Taking? Authorizing Provider  amLODipine (NORVASC) 10 MG tablet TAKE 1 TABLET(10 MG) BY MOUTH DAILY Patient taking differently: Take 10 mg by mouth daily. 07/11/20  Yes Maximiano Coss, NP  Canagliflozin-metFORMIN HCl ER (INVOKAMET XR) [phone] MG TB24 Take 2 tablets by  mouth daily. 07/11/20  Yes Maximiano Coss, NP  diclofenac Sodium (VOLTAREN) 1 % GEL Apply 2 g topically 4 (four) times daily. 07/11/20  Yes Maximiano Coss, NP  Dulaglutide 3 MG/0.5ML SOPN Inject 3 mg into the skin once a week. 07/11/20  Yes Maximiano Coss, NP  lisinopril (ZESTRIL) 40 MG tablet Take 1 tablet (40 mg total) by mouth daily. 07/11/20  Yes Maximiano Coss, NP  glucose blood (ONE TOUCH ULTRA TEST) test strip Use as instructed 07/11/20   Maximiano Coss, NP  hydrOXYzine (ATARAX/VISTARIL) 10 MG tablet Take 1-2 tablets (10-20 mg total) by mouth at bedtime as needed. Patient taking differently: Take 10-20 mg by mouth at bedtime as needed for anxiety. 07/11/20   Maximiano Coss, NP     Signature:   Kelee Cunningham D. Kenton Kingfisher, NP-C [facility] Personal contact information can be found on Amion  10/21/2020, 7:10 PM

## 2020-10-21 NOTE — Progress Notes (Signed)
Patient underwent Chest tube placement at bedside by Pulmonary Critical care physician (DO) - Verification of privileges was reviewed, printed  by house supervisor and charge nurse. Placed in patient shadow chart.

## 2020-10-22 ENCOUNTER — Inpatient Hospital Stay (HOSPITAL_COMMUNITY): Payer: Medicare HMO

## 2020-10-22 DIAGNOSIS — J90 Pleural effusion, not elsewhere classified: Principal | ICD-10-CM

## 2020-10-22 DIAGNOSIS — E1140 Type 2 diabetes mellitus with diabetic neuropathy, unspecified: Secondary | ICD-10-CM

## 2020-10-22 DIAGNOSIS — E871 Hypo-osmolality and hyponatremia: Secondary | ICD-10-CM

## 2020-10-22 LAB — AMYLASE, PLEURAL OR PERITONEAL FLUID: Amylase, Fluid: 30 U/L

## 2020-10-22 LAB — COMPREHENSIVE METABOLIC PANEL
ALT: 22 U/L (ref 0–44)
AST: 24 U/L (ref 15–41)
Albumin: 2.8 g/dL — ABNORMAL LOW (ref 3.5–5.0)
Alkaline Phosphatase: 54 U/L (ref 38–126)
Anion gap: 9 (ref 5–15)
BUN: 14 mg/dL (ref 8–23)
CO2: 24 mmol/L (ref 22–32)
Calcium: 8 mg/dL — ABNORMAL LOW (ref 8.9–10.3)
Chloride: 98 mmol/L (ref 98–111)
Creatinine, Ser: 0.56 mg/dL — ABNORMAL LOW (ref 0.61–1.24)
GFR, Estimated: >60 mL/min (ref >60)
Glucose, Bld: 189 mg/dL — ABNORMAL HIGH (ref 70–99)
Potassium: 3.7 mmol/L (ref 3.5–5.1)
Sodium: 131 mmol/L — ABNORMAL LOW (ref 135–145)
Total Bilirubin: 0.8 mg/dL (ref 0.3–1.2)
Total Protein: 6.4 g/dL — ABNORMAL LOW (ref 6.5–8.1)

## 2020-10-22 LAB — CBC
HCT: 42.8 % (ref 39.0–52.0)
Hemoglobin: 13.9 g/dL (ref 13.0–17.0)
MCH: 29.6 pg (ref 26.0–34.0)
MCHC: 32.5 g/dL (ref 30.0–36.0)
MCV: 91.1 fL (ref 80.0–100.0)
Platelets: 452 10*3/uL — ABNORMAL HIGH (ref 150–400)
RBC: 4.7 MIL/uL (ref 4.22–5.81)
RDW: 11.3 % — ABNORMAL LOW (ref 11.5–15.5)
WBC: 9.7 10*3/uL (ref 4.0–10.5)
nRBC: 0 % (ref 0.0–0.2)

## 2020-10-22 LAB — GLUCOSE, CAPILLARY
Glucose-Capillary: 194 mg/dL — ABNORMAL HIGH (ref 70–99)
Glucose-Capillary: 234 mg/dL — ABNORMAL HIGH (ref 70–99)
Glucose-Capillary: 276 mg/dL — ABNORMAL HIGH (ref 70–99)
Glucose-Capillary: 297 mg/dL — ABNORMAL HIGH (ref 70–99)

## 2020-10-22 LAB — BODY FLUID CELL COUNT WITH DIFFERENTIAL
Eos, Fluid: 1 %
Lymphs, Fluid: 81 %
Monocyte-Macrophage-Serous Fluid: 18 % — ABNORMAL LOW (ref 50–90)
Neutrophil Count, Fluid: 0 % (ref 0–25)
Total Nucleated Cell Count, Fluid: 1300 cu mm — ABNORMAL HIGH (ref 0–1000)

## 2020-10-22 LAB — PROTEIN, PLEURAL OR PERITONEAL FLUID: Total protein, fluid: 5.4 g/dL

## 2020-10-22 LAB — PROCALCITONIN: Procalcitonin: <0.1 ng/mL

## 2020-10-22 LAB — GLUCOSE, PLEURAL OR PERITONEAL FLUID: Glucose, Fluid: 217 mg/dL

## 2020-10-22 LAB — HEMOGLOBIN A1C
Hgb A1c MFr Bld: 10.5 % — ABNORMAL HIGH (ref 4.8–5.6)
Mean Plasma Glucose: 254.65 mg/dL

## 2020-10-22 LAB — LACTATE DEHYDROGENASE, PLEURAL OR PERITONEAL FLUID: LD, Fluid: 162 U/L — ABNORMAL HIGH (ref 3–23)

## 2020-10-22 LAB — ALBUMIN, PLEURAL OR PERITONEAL FLUID: Albumin, Fluid: 2.9 g/dL

## 2020-10-22 LAB — STREP PNEUMONIAE URINARY ANTIGEN: Strep Pneumo Urinary Antigen: NEGATIVE

## 2020-10-22 LAB — HIV ANTIBODY (ROUTINE TESTING W REFLEX): HIV Screen 4th Generation wRfx: NONREACTIVE

## 2020-10-22 MED ORDER — LACTATED RINGERS IV SOLN: INTRAVENOUS | Status: DC

## 2020-10-22 MED ORDER — SODIUM CHLORIDE (PF) 0.9 % IJ SOLN
INTRAMUSCULAR | Status: AC
  Filled 2020-10-22: qty 50

## 2020-10-22 MED ORDER — IOHEXOL 300 MG/ML  SOLN
75.0000 mL | Freq: Once | INTRAMUSCULAR | Status: AC | PRN
  Administered 2020-10-22: 75 mL via INTRAVENOUS

## 2020-10-22 NOTE — Evaluation (Signed)
Occupational Therapy Evaluation Patient Details Name: JOSEPHANTHONY TINDEL MRN: [id number] DOB: 08-06-1955 Today's Date: 10/22/2020    History of Present Illness Edward [name] is a 65 year old male initially presented with complaints of shortness of breath, chest pain, and shoulder pain who was found to have very large right pleural effusion with complete atelectasis of the lung. Medical history significant for but not limited too hypertension, diabetes mellitus type 2, history of benign tumor removal on his left kidney, history of thyroid nodules. Imaging showed Large right-sided pleural effusion with complete atelectasis of the right lung and mild mediastinal shift of contents to the left. 14 mm possible hyperenhancing nodule in the left hepatic lobe. Chest tube placed 6/22   Clinical Impression   Mr. Adelard Sanon is a 65 year old man who presents supine in bed with chest tube. Interpreter used for evaluation. Patient demonstrates ability to perform bed mobility, ambulation at bed side, normal balance and ability to perform ADLs. Patient reports right shoulder pain has resolved. UE strength and ROM WNL. Patient reports minimal pain at chest tube site. Patient has no OT needs at this time.     Follow Up Recommendations  No OT follow up    Equipment Recommendations  None recommended by OT    Recommendations for Other Services       Precautions / Restrictions Precautions Precaution Comments: R  chest tube Restrictions Weight Bearing Restrictions: No      Mobility Bed Mobility Overal bed mobility: Modified Independent                  Transfers Overall transfer level: Needs assistance               General transfer comment: No physical assistance. Supervision only to monitor chest tube.    Balance Overall balance assessment: Independent                                         ADL either performed or assessed with clinical judgement   ADL Overall ADL's :  Modified independent                                       General ADL Comments: Limited only by chest tube     Vision Patient Visual Report: No change from baseline       Perception     Praxis      Pertinent Vitals/Pain Pain Assessment: Faces Faces Pain Scale: Hurts a little bit Pain Location: CT site Pain Descriptors / Indicators: Grimacing Pain Intervention(s): Monitored during session     Hand Dominance Right   Extremity/Trunk Assessment Upper Extremity Assessment Upper Extremity Assessment: Overall WFL for tasks assessed   Lower Extremity Assessment Lower Extremity Assessment: Defer to PT evaluation   Cervical / Trunk Assessment Cervical / Trunk Assessment: Normal   Communication Communication Communication: Interpreter utilized   Cognition Arousal/Alertness: Awake/alert Behavior During Therapy: WFL for tasks assessed/performed Overall Cognitive Status: Within Functional Limits for tasks assessed                                     General Comments  Able to resist anterior pushes.    Exercises     Shoulder Instructions  Home Living Family/patient expects to be discharged to:: Private residence Living Arrangements: Children;Spouse/significant other Available Help at Discharge: Family Type of Home: House Home Access: Stairs to enter Technical brewer of Steps: 3 Entrance Stairs-Rails: None Home Layout: One level               Home Equipment: None          Prior Functioning/Environment Level of Independence: Independent        Comments: works in a [place] - full time, drives        OT Problem List: Cardiopulmonary status limiting activity      OT Treatment/Interventions:      OT Goals(Current goals can be found in the care plan section) Acute Rehab OT Goals OT Goal Formulation: All assessment and education complete, DC therapy  OT Frequency:     Barriers to D/C:             Co-evaluation              AM-PAC OT "6 Clicks" Daily Activity     Outcome Measure Help from another person eating meals?: None Help from another person taking care of personal grooming?: None Help from another person toileting, which includes using toliet, bedpan, or urinal?: None Help from another person bathing (including washing, rinsing, drying)?: None Help from another person to put on and taking off regular upper body clothing?: None Help from another person to put on and taking off regular lower body clothing?: None 6 Click Score: 24   End of Session Nurse Communication: Mobility status  Activity Tolerance: Patient tolerated treatment well Patient left: in bed;with call bell/phone within reach;with bed alarm set  OT Visit Diagnosis: Muscle weakness (generalized) (M62.81)                Time: 7622-6333 OT Time Calculation (min): 14 min Charges:  OT General Charges $OT Visit: 1 Visit OT Evaluation $OT Eval Low Complexity: 1 Low  Nour Scalise, OTR/L [place]  Office [phone] Pager: [place] 10/22/2020, 12:48 PM

## 2020-10-22 NOTE — Hospital Course (Addendum)
65 year old man PMH diabetes mellitus type 2 presented with shoulder pain and shortness of breath.  Admitted for large right pleural effusion treated with chest tube per pulmonology.  Continues to have significant output, no opportunity to remove chest tube yet.  Status post thrombolytics 6/25, 6/26, 6/27.  Repeat CT shows resolution of right upper lobe process.  Decreased pleural effusion.

## 2020-10-22 NOTE — Progress Notes (Signed)
   10/22/20 0219  Respiratory  Respiratory (WDL) X  Cough None  Respiratory Interventions Incentive spirometry w/ teach back  R Upper  Breath Sounds Clear;Diminished  R Lower Breath Sounds Clear;Diminished  L Upper Breath Sounds Clear  L Lower Breath Sounds Clear  Respiratory Pattern Regular;Dyspnea with exertion  Chest Assessment Chest expansion symmetrical  Chest Tube Lateral;Right Pleural 14 Fr.  Placement Date/Time: 10/21/20 2105   Inserted prior to hospital arrival?: No  Person Inserting LDA: Dr. Ayesha Rumpf  Chest Tube Orientation: Lateral;Right  Chest Tube Location: Pleural  Chest Tube Type: Straight  Size (Fr.): 14 Fr.  Chest Tube Drainage Syst...  Status To water seal  Chest Tube Air Leak None  Patency Intervention Tip/tilt  Drainage Description Yellow;Other (Comment) (green)  Dressing Status Clean;Dry;Intact  Output (mL) 650 mL  Collection device changed. Pt tolerated well.  No excessive coughing noted so CT remains unclamped.  Pt states that he can breathe better now.

## 2020-10-22 NOTE — Progress Notes (Signed)
Initial Nutrition Assessment  DOCUMENTATION CODES:   Not applicable  INTERVENTION:   Encourage PO intake  Support family bringing in food for patient and supplement as needed.    NUTRITION DIAGNOSIS:   Increased nutrient needs related to acute illness as evidenced by estimated needs.  GOAL:   Patient will meet greater than or equal to 90% of their needs  MONITOR:   PO intake  REASON FOR ASSESSMENT:   Malnutrition Screening Tool    ASSESSMENT:   Pt with PMH of DM and HTN admitted with large R pleural effusion from unknown etiology, cannot rule out parapneumonic effusion or oncological process.   Pt speaks [place] through interpreter.  Per MST score pt has lost 4 lb recently PTA.   Pt had pigtail catheter inserted yesterday with 2 L removed.  Pt with good appetite this am. Per RN pt's family is bringing in food.  Meal Completion: 100% this am  6/21 s/p 14 F pigtail catheter with 2 L removed post insertion  Medications reviewed and include: SSI with meals and bedtime Labs reviewed: Na 131    Diet Order:   Diet Order             Diet heart healthy/carb modified Room service appropriate? Yes; Fluid consistency: Thin  Diet effective now                   EDUCATION NEEDS:   Not appropriate for education at this time  Skin:  Skin Assessment: Reviewed RN Assessment  Last BM:  unknown  Height:   Ht Readings from Last 1 Encounters:  10/21/20 5\' 2"  (1.575 m)    Weight:   Wt Readings from Last 1 Encounters:  10/22/20 58.4 kg    Ideal Body Weight:  53.6 kg  BMI:  Body mass index is 23.55 kg/m.  Estimated Nutritional Needs:   Kcal:  1600-1800  Protein:  85-100 grams  Fluid:  >1.6 L/day  Lockie Pares., RD, LDN, CNSC See AMiON for contact information

## 2020-10-22 NOTE — Progress Notes (Addendum)
   NAME:  Edward [name], MRN:  [id number], DOB:  [birth year], LOS: 1 ADMISSION DATE:  10/21/2020, CONSULTATION DATE: 10/21/2020 REFERRING MD:  Dr. Alvino Chapel, CHIEF COMPLAINT: Large right pleural effusion  Brief narrative:  65 year old male initially presented with complaints of shortness of breath, chest pain, and shoulder pain who was found to have very large right pleural effusion with complete atelectasis of the lung.  PCCM consulted 6/21, pigtail chest tube placed overnight with approximately 2 L removed post insertion.  Lights criteria indicates exudative effusion  Pertinent  Medical History  Diabetes Hypertension  Significant Hospital Events:  6/21 Admitted for shortness of breath, right shoulder pain, and chest pain found To right pleural effusion with complete atelectasis of right lung with mild mediastinal shift 6/22 status post placement pigtail chest tube yesterday evening with approximately 2 L out thus far.  Effusion is exudative by Lights criteria  Interim History / Subjective:  Interpreter used to communicate States he feels much better with no acute complaints, resolved dyspnea, and resolved the shoulder pain.  Objective   Blood pressure 116/70, pulse (!) 102, temperature 98.6 F (37 C), temperature source Oral, resp. rate 20, height 5\' 2"  (1.575 m), weight 58.4 kg, SpO2 99 %.        Intake/Output Summary (Last 24 hours) at 10/22/2020 0743 Last data filed at 10/22/2020 0513 Gross per 24 hour  Intake 550.17 ml  Output 2710 ml  Net -2159.83 ml    Filed Weights   10/21/20 1501 10/21/20 2043 10/22/20 0511  Weight: 61.7 kg 61.3 kg 58.4 kg    Examination: General: Very pleasant [place] middle-aged male sitting up in bedside recliner no acute distress  HEENT: [place]/AT, MM pink/moist, PERRL,  Neuro: Alert and oriented x3, nonfocal CV: s1s2 regular rate and rhythm, no murmur, rubs, or gallops,  PULM: Clear to auscultation bilaterally, no increased work of breathing, no  added breath sounds, pigtail chest tube in place serous drainage seen GI: soft, bowel sounds active in all 4 quadrants, non-tender, non-distended, tolerating oral diet Extremities: warm/dry, no edema  Skin: no rashes or lesions  Labs/imaging that I havepersonally reviewed   6/21 CT scan chest large right pleural effusion with complete atelectasis of the right lung and mild mediastinal shift contents to the left, 14 mm possible hyperenhancing nodule in the left hepatic lobe 6/20 chest x-ray significant improvement in effusion with no pneumothorax  Resolved Hospital Problem list     Assessment & Plan:  Large right pleural effusion -Unknown etiology cannot rule out parapneumonic effusion or oncological process.  Effusion is exudative by lights criteria, for normal LDH 162, total protein fluid 5.4 P: Routine chest tube care Continue suction Repeat chest CT to assess for masses or nodules Encourage frequent pulmonary hygiene Continue empiric Unasyn Mobilize as able Supportive care Follow cultures   Best Practice  Per primary    Signature:   Annaleise Burger D. Kenton Kingfisher, NP-C [facility] Personal contact information can be found on Amion  10/22/2020, 7:43 AM

## 2020-10-22 NOTE — Progress Notes (Signed)
PROGRESS NOTE  Edward [name] HUD:[id number] DOB: 1955-10-13 DOA: 10/21/2020 PCP: Pcp, No  Brief History   65 year old man PMH diabetes mellitus type 2 presented with shoulder pain and shortness of breath.  Admitted for large right pleural effusion.  A & P  Right-sided pleural effusion. --Status post right sided chest tube placement with removal of 2 L -- No hypoxia.  Feels "back to normal" --CT chest follow-up as per pulmonology  Hyponatremia --Probably related to lung process.  Follow clinically.  Asymptomatic  Diabetes mellitus type --CBG stable.  Hold oral medications.  Continue sliding scale insulin.  Disposition Plan:  Discussion:   Status is: Inpatient  Remains inpatient appropriate because:Inpatient level of care appropriate due to severity of illness  Dispo: The patient is from: Home              Anticipated d/c is to: Home              Patient currently is not medically stable to d/c.   Difficult to place patient No  DVT prophylaxis: enoxaparin (LOVENOX) injection 40 mg Start: 10/21/20 2200 SCDs Start: 10/21/20 2111   Code Status: Full Code Level of care: Progressive Family Communication: none  Murray Hodgkins, MD  [facility] Direct contact: see [email or web address] (further directions at bottom of note if needed) 7PM-7AM contact night coverage as at bottom of note 10/22/2020, 5:57 PM  LOS: 1 day    Consults:  Pulmonology    Procedures:    Significant Diagnostic Tests:     Micro Data:  BC   Antimicrobials:  Unasyn 6/21 >  Interval History/Subjective  CC: f/u SOB  Feels much better, "back to normal" Interviewed and examined interpreter   Objective   Vitals:  Vitals:   10/22/20 1355 10/22/20 1752  BP: (!) 99/59 120/69  Pulse: (!) 101 98  Resp:    Temp:    SpO2: 97% 94%    Exam:  Constitutional:   Appears calm and comfortable Respiratory:  CTA bilaterally, no w/r/r.  Respiratory effort normal.  Cardiovascular:  RRR, no  m/r/g Psychiatric:  Mental status Mood, affect appropriate  I have personally reviewed the following:   Today's Data  CBG stable CMP, procalcitonin, CBC stable  Scheduled Meds:  amLODipine  10 mg Oral Daily   enoxaparin (LOVENOX) injection  40 mg Subcutaneous Q24H   fentaNYL (SUBLIMAZE) injection  50 mcg Intravenous Once   guaiFENesin  600 mg Oral BID   insulin aspart  0-5 Units Subcutaneous QHS   insulin aspart  0-9 Units Subcutaneous TID WC   lisinopril  40 mg Oral Daily   sodium chloride (PF)       Continuous Infusions:  ampicillin-sulbactam (UNASYN) IV 3 g (10/22/20 1133)    Principal Problem:   Pleural effusion on right Active Problems:   Type 2 diabetes mellitus ([address])   Hyponatremia   LOS: 1 day   How to contact the [facility] Attending or Consulting provider [place] or covering provider during after hours [place], for this patient?  Check the care team in [facility] and look for a) attending/consulting [facility] provider listed and b) the [facility] team listed Log into [email or web address] and use [place]'s universal password to access. If you do not have the password, please contact the hospital operator. Locate the [facility] provider you are looking for under [facility] and page to a number that you can be directly reached. If you still have difficulty reaching the provider, please page the [facility] (Director on  Call) for the Hospitalists listed on amion for assistance.

## 2020-10-22 NOTE — Progress Notes (Signed)
Inpatient Diabetes Program Recommendations  AACE/ADA: New Consensus Statement on Inpatient Glycemic Control (2015)  Target Ranges:  Prepandial:   less than 140 mg/dL      Peak postprandial:   less than 180 mg/dL (1-2 hours)      Critically ill patients:  140 - 180 mg/dL   Lab Results  Component Value Date   GLUCAP 194 (H) 10/22/2020   HGBA1C 10.5 (H) 10/21/2020    Review of Glycemic Control Results for Edward [name], Edward [name] (MRN [id number]) as of 10/22/2020 09:51  Ref. Range 10/21/2020 21:29 10/22/2020 07:41  Glucose-Capillary Latest Ref Range: 70 - 99 mg/dL 181 (H) 194 (H)   Diabetes history: DM 2 Outpatient Diabetes medications:  Invokamet XR [phone] mg daily, Dulaglutide 3 mg once a week Current orders for Inpatient glycemic control:  Novolog sensitive tid with meals and HS Inpatient Diabetes Program Recommendations:    While in the hospital, consider adding Lantus 8 units daily.   Thanks,  Adah Perl, RN, BC-ADM Inpatient Diabetes Coordinator Pager [phone]  (8a-5p)

## 2020-10-22 NOTE — Evaluation (Signed)
Physical Therapy Evaluation Patient Details Name: Edward [name] MRN: [id number] DOB: 1955/08/29 Today's Date: 10/22/2020   History of Present Illness  Philopateer AKASHDEEP CHUBA is a 65 year old male initially presented with complaints of shortness of breath, chest pain, and shoulder pain who was found to have very large right pleural effusion with complete atelectasis of the lung. 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 Imaging showed Large right-sided pleural effusion with complete atelectasis of the right lung and mild mediastinal shift of contents to the left. 14 mm possible hyperenhancing nodule in the left hepatic lobe. Chest tube placed 6/22  Clinical Impression  Patient evaluated by Physical Therapy with no further acute PT needs identified. All education has been completed and the patient has no further questions.  Pt amb ~ 280', no AD, steady but slower gait with no LOB. SpO2=89-100% on RA, HR max 110. Pt reports feeling much better overall today. No further needs  See below for any follow-up Physical Therapy or equipment needs. PT is signing off. Thank you for this referral.     Follow Up Recommendations No PT follow up    Equipment Recommendations  None recommended by PT    Recommendations for Other Services       Precautions / Restrictions Precautions Precautions: Fall Precaution Comments: R  chest tube Restrictions Weight Bearing Restrictions: No      Mobility  Bed Mobility Overal bed mobility: Modified Independent                  Transfers Overall transfer level: Needs assistance Equipment used: None             General transfer comment: No physical assistance. Supervision only to monitor chest tube.  Ambulation/Gait Ambulation/Gait assistance: Supervision Gait Distance (Feet): 280 Feet Assistive device: None Gait Pattern/deviations: Step-through pattern     General Gait  Details: slow but steady gait,  SpO2= 89-100% on RA, HR max 110.  Stairs            Wheelchair Mobility    Modified Rankin (Stroke Patients Only)       Balance Overall balance assessment: Independent                                           Pertinent Vitals/Pain Pain Assessment: No/denies pain    Home Living Family/patient expects to be discharged to:: Private residence Living Arrangements: Children;Spouse/significant other Available Help at Discharge: Family Type of Home: House Home Access: Stairs to enter Entrance Stairs-Rails: None Technical brewer of Steps: 3 Home Layout: One level Home Equipment: None      Prior Function Level of Independence: Independent         Comments: 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    Exercises     Assessment/Plan    PT Assessment Patent does not need any further PT services  PT Problem List         PT Treatment Interventions      PT Goals (Current goals can be found in the Care Plan section)  Acute Rehab PT Goals PT Goal Formulation: All assessment and education complete, DC therapy    Frequency     Barriers to discharge        Co-evaluation               AM-PAC PT "6 Clicks" Mobility  Outcome Measure Help needed turning from  your back to your side while in a flat bed without using bedrails?: None Help needed moving from lying on your back to sitting on the side of a flat bed without using bedrails?: None Help needed moving to and from a bed to a chair (including a wheelchair)?: None Help needed standing up from a chair using your arms (e.g., wheelchair or bedside chair)?: None Help needed to walk in hospital room?: None Help needed climbing 3-5 steps with a railing? : None 6 Click Score: 24    End of Session Equipment Utilized During Treatment: Gait belt Activity Tolerance: Patient tolerated treatment well Patient left: with call bell/phone within reach;in bed   PT Visit Diagnosis: Other abnormalities of gait and mobility (R26.89)    Time: 9233-0076 PT Time Calculation (min) (ACUTE ONLY): 23 min   Charges:   PT Evaluation $PT Eval Low Complexity: 1 Low PT Treatments $Gait Training: 8-22 mins        Baxter Flattery, PT  Acute Rehab Dept ([place]) [phone] Pager [phone]  10/22/2020   [facility] 10/22/2020, 3:58 PM

## 2020-10-22 NOTE — Progress Notes (Signed)
Chaplain engaged in an initial visit with Edward [name] and his daughter.  Utilizing daughter, chaplain was able to provide education around the Advanced Directive, Healthcare POA.  Chaplain explained how they could take the document home and complete it, as well as store it in their records.  Daughter noted that her father would like to appoint her as his healthcare POA.    Chaplain offered support and let them know the AD could be completed while he is at the hospital as well.  Chaplain is available to follow-up.    10/22/20 1000  Clinical Encounter Type  Visited With Patient and family together  Visit Type Initial  Referral From Family  Consult/Referral To Chaplain  Spiritual Encounters  Spiritual Needs Other (Comment)

## 2020-10-23 LAB — GLUCOSE, CAPILLARY
Glucose-Capillary: 151 mg/dL — ABNORMAL HIGH (ref 70–99)
Glucose-Capillary: 291 mg/dL — ABNORMAL HIGH (ref 70–99)
Glucose-Capillary: 87 mg/dL (ref 70–99)
Glucose-Capillary: 237 mg/dL — ABNORMAL HIGH (ref 70–99)

## 2020-10-23 LAB — LEGIONELLA PNEUMOPHILA SEROGP 1 UR AG: L. pneumophila Serogp 1 Ur Ag: NEGATIVE

## 2020-10-23 LAB — PATHOLOGIST SMEAR REVIEW

## 2020-10-23 LAB — PROCALCITONIN: Procalcitonin: <0.1 ng/mL

## 2020-10-23 LAB — TRIGLYCERIDES, BODY FLUIDS: Triglycerides, Fluid: 29 mg/dL

## 2020-10-23 LAB — CYTOLOGY - NON PAP

## 2020-10-23 MED ORDER — INSULIN GLARGINE 100 UNIT/ML ~~LOC~~ SOLN
8.0000 [IU] | Freq: Every day | SUBCUTANEOUS | Status: DC
  Administered 2020-10-23 – 2020-10-24 (×2): 8 [IU] via SUBCUTANEOUS
  Filled 2020-10-23 (×2): qty 0.08

## 2020-10-23 MED ORDER — SODIUM CHLORIDE 0.9 % IV SOLN
INTRAVENOUS | Status: DC | PRN
  Administered 2020-10-23 – 2020-10-26 (×2): 250 mL via INTRAVENOUS

## 2020-10-23 NOTE — Progress Notes (Signed)
PROGRESS NOTE  Edward NGHIEM BTD:[id number] DOB: 1955-06-04 DOA: 10/21/2020 PCP: Pcp, No  Brief History   65 year old man PMH diabetes mellitus type 2 presented with shoulder pain and shortness of breath.  Admitted for large right pleural effusion.  A & P  Right-sided pleural effusion. --appears well --appreciate pulmonology -- etiology unclear, cannot r/o malignancy at this point --continue CT per pulmonology and f/u as outlined  Hyponatremia --Probably related to lung process.  Follow clinically.  Asymptomatic.   Diabetes mellitus type --CBG stable but a bit high.  Holding oral medications.  Continue sliding scale insulin. Will add Lantus  Disposition Plan:  Discussion:   Status is: Inpatient  Remains inpatient appropriate because:Inpatient level of care appropriate due to severity of illness  Dispo: The patient is from: Home              Anticipated d/c is to: Home              Patient currently is not medically stable to d/c.   Difficult to place patient No  DVT prophylaxis: enoxaparin (LOVENOX) injection 40 mg Start: 10/21/20 2200 SCDs Start: 10/21/20 2111   Code Status: Full Code Level of care: Progressive Family Communication: none  Murray Hodgkins, MD  [facility] Direct contact: see [email or web address] (further directions at bottom of note if needed) 7PM-7AM contact night coverage as at bottom of note 10/23/2020, 11:17 AM  LOS: 2 days    Consults:  Pulmonology    Procedures:    Significant Diagnostic Tests:     Micro Data:  BC   Antimicrobials:  Unasyn 6/21 >  Interval History/Subjective  CC: f/u SOB  Seen w/ interpreter Feels very well Breathing fine Eating fine  Review of Systems  Respiratory:  Negative for shortness of breath.     Objective   Vitals:  Vitals:   10/22/20 2226 10/23/20 0413  BP: 121/67 124/75  Pulse: 88 89  Resp: 18 18  Temp: 98.4 F (36.9 C) 98.6 F (37 C)  SpO2: 98% 99%    Exam: Physical Exam Vitals and  nursing note reviewed.  Constitutional:      Appearance: Normal appearance. He is not ill-appearing.  Cardiovascular:     Rate and Rhythm: Normal rate and regular rhythm.     Heart sounds: No murmur heard.   No gallop.  Pulmonary:     Effort: Pulmonary effort is normal. No respiratory distress.     Breath sounds: No wheezing or rales.  Neurological:     Mental Status: He is alert.  Psychiatric:        Mood and Affect: Mood normal.        Behavior: Behavior normal.     I have personally reviewed the data including:   Today's Data  CBG remains stable  Scheduled Meds:  amLODipine  10 mg Oral Daily   enoxaparin (LOVENOX) injection  40 mg Subcutaneous Q24H   fentaNYL (SUBLIMAZE) injection  50 mcg Intravenous Once   guaiFENesin  600 mg Oral BID   insulin aspart  0-5 Units Subcutaneous QHS   insulin aspart  0-9 Units Subcutaneous TID WC   insulin glargine  8 Units Subcutaneous Daily   lisinopril  40 mg Oral Daily   Continuous Infusions:  sodium chloride 250 mL (10/23/20 1042)   ampicillin-sulbactam (UNASYN) IV 3 g (10/23/20 1042)    Principal Problem:   Pleural effusion on right Active Problems:   Type 2 diabetes mellitus ([place])   Hyponatremia   LOS: 2  days   How to contact the [facility] Attending or Consulting provider [place] or covering provider during after hours [place], for this patient?  Check the care team in [facility] and look for a) attending/consulting TRH provider listed and b) the [facility] team listed Log into [email or web address] and use [place]'s universal password to access. If you do not have the password, please contact the hospital operator. Locate the [facility] provider you are looking for under [facility] and page to a number that you can be directly reached. If you still have difficulty reaching the provider, please page the [facility] (Director on Call) for the Hospitalists listed on amion for assistance.

## 2020-10-23 NOTE — Progress Notes (Addendum)
Inpatient Diabetes Program Recommendations  AACE/ADA: New Consensus Statement on Inpatient Glycemic Control (2015)  Target Ranges:  Prepandial:   less than 140 mg/dL      Peak postprandial:   less than 180 mg/dL (1-2 hours)      Critically ill patients:  140 - 180 mg/dL   Lab Results  Component Value Date   GLUCAP 151 (H) 10/23/2020   HGBA1C 10.5 (H) 10/21/2020    Review of Glycemic Control Results for CHIMA, ASTORINO (MRN [id number]) as of 10/23/2020 10:48  Ref. Range 10/22/2020 07:41 10/22/2020 11:29 10/22/2020 16:09 10/22/2020 23:48 10/23/2020 07:44  Glucose-Capillary Latest Ref Range: 70 - 99 mg/dL 194 (H) 234 (H) 276 (H) 297 (H) 151 (H)   Diabetes history: DM 2 Outpatient Diabetes medications: Invokamet XR 150-1000mg - 2 tabs daily, Dulaglutide 3 mg weekly Current orders for Inpatient glycemic control:  Novolog sensitive tid with meals and HS Inpatient Diabetes Program Recommendations:    While in the hospital, consider adding Lantus 8 units daily.   Thanks,  Adah Perl, RN, BC-ADM Inpatient Diabetes Coordinator Pager [phone]  (8a-5p)   Addendum:  briefly spoke to patient and discussed possible use of insulin with him?  He said he has taken before and is willing to restart as needed.  Will need educational follow-up with appropriate. Patient may be referring to once weekly Trulicity-  Will follow.

## 2020-10-23 NOTE — Plan of Care (Signed)
  Problem: Education: Goal: Knowledge of General Education information will improve Description: Including pain rating scale, medication(s)/side effects and non-pharmacologic comfort measures Outcome: Progressing   Problem: Health Behavior/Discharge Planning: Goal: Ability to manage health-related needs will improve Outcome: Progressing   Problem: Clinical Measurements: Goal: Ability to maintain clinical measurements within normal limits will improve Outcome: Progressing Goal: Will remain free from infection Outcome: Progressing Goal: Diagnostic test results will improve Outcome: Progressing Goal: Respiratory complications will improve Outcome: Progressing Goal: Cardiovascular complication will be avoided Outcome: Progressing   Problem: Safety: Goal: Ability to remain free from injury will improve Outcome: Progressing

## 2020-10-23 NOTE — Progress Notes (Signed)
NAME:  ELSIE SAKUMA, MRN:  [id number], DOB:  [birth year], LOS: 2 ADMISSION DATE:  10/21/2020, CONSULTATION DATE: 10/21/2020 REFERRING MD:  Dr. Alvino Chapel, CHIEF COMPLAINT: Large right pleural effusion  Brief narrative:  65 year old male initially presented with complaints of shortness of breath, chest pain, and shoulder pain who was found to have very large right pleural effusion with complete atelectasis of the lung.  PCCM consulted 6/21, pigtail chest tube placed overnight with approximately 2 L removed post insertion.  Lights criteria indicates exudative effusion  Pertinent  Medical History  Diabetes Hypertension  Significant Hospital Events:  6/21 Admitted for shortness of breath, right shoulder pain, and chest pain found To right pleural effusion with complete atelectasis of right lung with mild mediastinal shift 6/22 status post placement pigtail chest tube yesterday evening with approximately 2 L out thus far.  Effusion is exudative by Lights criteria 6/22 CT chest Chest tube output 210 cc / 24h  Interim History / Subjective:   Effusion is an exudate with cytology and cultures pending.  Lymphocyte predominance on cell count He feels better, "back to normal" Denies any coughing or chest discomfort.  Shoulder pain is resolved CT chest done 6/22 as above   Objective   Blood pressure 124/75, pulse 89, temperature 98.6 F (37 C), temperature source Oral, resp. rate 18, height 5\' 2"  (1.575 m), weight 59.3 kg, SpO2 99 %.        Intake/Output Summary (Last 24 hours) at 10/23/2020 1028 Last data filed at 10/23/2020 0900 Gross per 24 hour  Intake 830 ml  Output 2660 ml  Net -1830 ml   Filed Weights   10/21/20 2043 10/22/20 0511 10/23/20 0434  Weight: 61.3 kg 58.4 kg 59.3 kg    Examination: General: Pleasant man sitting up to chair in no distress HEENT: Oropharynx clear, pupils equal Neuro: Awake, alert and appropriate CV: Heart regular without a murmur PULM: Clear  bilaterally, few coarse right-sided breath sounds.  Chest tube in place on waterseal GI: Nondistended, positive bowel sounds Extremities: No edema Skin: No rash  Labs/imaging that I have personally reviewed   6/21 CT scan chest large right pleural effusion with complete atelectasis of the right lung and mild mediastinal shift contents to the left, 14 mm possible hyperenhancing nodule in the left hepatic lobe 6/20 chest x-ray significant improvement in effusion with no pneumothorax CT chest 6/22 >> significant improvement in right effusion with right-sided pigtail catheter inferiorly adjacent to right lower lobe, significant reexpansion of the right lung.  Mild thickened appearance of the right pleural surface.  Large scattered areas of infiltrate, airspace opacity in the right upper lobe, no overt mass  Resolved Hospital Problem list     Assessment & Plan:  Large right pleural effusion, exudate -Etiology unclear.  Entire clinical picture concerning for malignancy and he does have some pleural thickening.  Cytology is pending.  Question whether associated right upper lobe infiltrate/opacity is cause or effect of the effusion RUL infiltrate. -Question whether this represents residual atelectasis as the right lung reexpands.  No overt mass but there is some consolidation, could disguise a mass on this imaging.  Interestingly he does not have clinical history to support pneumonia although agree with treating P: -Routine chest tube care, flushes -Follow chest x-ray.  If no significant reaccumulation and if chest tube output < 50cc/24h then could consider removal.  Would recommend ensuring that cultures are negative prior to removal.  May be prudent to see cytology results first.  If negative  could repeat cytology on this fluid before the tube is removed (repeat sampling increases sensitivity to diagnose malignancy) -Question whether he might need pleuroscopy, CT surgery evaluation if the pleural  fluid cytology is negative -Agree with empiric antibiotics, could transition Unasyn to p.o. regimen.  Based on his clinical history and status suspicion for pneumonia is decreasing -Suspect he may require repeat CT chest after several days to assess right upper lobe infiltrate, depending on how this evolves on chest x-ray.  Hopefully it will resolve quickly, represents atelectasis   Signature:    Baltazar Apo, MD, PhD 10/23/2020, 10:41 AM [facility] [phone] or if no answer before 7:00PM call [phone] For any issues after 7:00PM please call eLink [phone]

## 2020-10-24 ENCOUNTER — Inpatient Hospital Stay (HOSPITAL_COMMUNITY): Payer: Medicare HMO

## 2020-10-24 LAB — GLUCOSE, CAPILLARY
Glucose-Capillary: 195 mg/dL — ABNORMAL HIGH (ref 70–99)
Glucose-Capillary: 210 mg/dL — ABNORMAL HIGH (ref 70–99)
Glucose-Capillary: 270 mg/dL — ABNORMAL HIGH (ref 70–99)
Glucose-Capillary: 196 mg/dL — ABNORMAL HIGH (ref 70–99)

## 2020-10-24 MED ORDER — INSULIN GLARGINE 100 UNIT/ML ~~LOC~~ SOLN
12.0000 [IU] | Freq: Every day | SUBCUTANEOUS | Status: DC
  Administered 2020-10-25 – 2020-10-28 (×4): 12 [IU] via SUBCUTANEOUS
  Filled 2020-10-24 (×4): qty 0.12

## 2020-10-24 NOTE — Progress Notes (Signed)
PROGRESS NOTE  Edward [name] QMG:[id number] DOB: June 13, 1955 DOA: 10/21/2020 PCP: Pcp, No  Brief History   65 year old man PMH diabetes mellitus type 2 presented with shoulder pain and shortness of breath.  Admitted for large right pleural effusion.  A & P  Right-sided pleural effusion. -- Appreciate pulmonology.  Patient feeling well.  Chest tube management per pulmonology.  Hopefully out 6/25.  Hyponatremia --Probably related to lung process.  Follow clinically.  Asymptomatic.   Diabetes mellitus type --CBG a bit high.  Increase long-acting insulin.  Resume oral medications on discharge.  Disposition Plan:  Discussion:   Status is: Inpatient  Remains inpatient appropriate because:Inpatient level of care appropriate due to severity of illness  Dispo: The patient is from: Home              Anticipated d/c is to: Home              Patient currently is not medically stable to d/c.   Difficult to place patient No  DVT prophylaxis: enoxaparin (LOVENOX) injection 40 mg Start: 10/21/20 2200 SCDs Start: 10/21/20 2111   Code Status: Full Code Level of care: Med-Surg Family Communication: none  Edward Hodgkins, MD  [facility] Direct contact: see [email or web address] (further directions at bottom of note if needed) 7PM-7AM contact night coverage as at bottom of note 10/24/2020, 3:34 PM  LOS: 3 days    Consults:  Pulmonology    Procedures:    Significant Diagnostic Tests:     Micro Data:  BC   Antimicrobials:  Unasyn 6/21 >  Interval History/Subjective  CC: f/u SOB  Seen w/ interpreter Feels good  Review of Systems  Respiratory:  Negative for shortness of breath.   Gastrointestinal:        Eating well    Objective   Vitals:  Vitals:   10/24/20 0515 10/24/20 1246  BP: 129/78 119/61  Pulse: 95 (!) 104  Resp: 20 18  Temp: (!) 97.5 F (36.4 C) 98.3 F (36.8 C)  SpO2: 99% 98%    Exam: Physical Exam Vitals and nursing note reviewed.  Constitutional:       Appearance: Normal appearance. He is not ill-appearing.  Cardiovascular:     Rate and Rhythm: Normal rate and regular rhythm.     Heart sounds: No murmur heard.   No gallop.  Pulmonary:     Effort: Pulmonary effort is normal. No respiratory distress.     Breath sounds: No wheezing or rales.  Neurological:     Mental Status: He is alert.  Psychiatric:        Mood and Affect: Mood normal.        Behavior: Behavior normal.   I have personally reviewed the data including:   Today's Data  CBG a little high  Scheduled Meds:  amLODipine  10 mg Oral Daily   enoxaparin (LOVENOX) injection  40 mg Subcutaneous Q24H   fentaNYL (SUBLIMAZE) injection  50 mcg Intravenous Once   guaiFENesin  600 mg Oral BID   insulin aspart  0-5 Units Subcutaneous QHS   insulin aspart  0-9 Units Subcutaneous TID WC   [START ON 10/25/2020] insulin glargine  12 Units Subcutaneous Daily   lisinopril  40 mg Oral Daily   Continuous Infusions:  sodium chloride 250 mL (10/23/20 1042)   ampicillin-sulbactam (UNASYN) IV 3 g (10/24/20 1135)    Principal Problem:   Pleural effusion on right Active Problems:   Type 2 diabetes mellitus (HCC)   Hyponatremia  LOS: 3 days   How to contact the [facility] Attending or Consulting provider [place] or covering provider during after hours [place], for this patient?  Check the care team in [facility] and look for a) attending/consulting TRH provider listed and b) the [facility] team listed Log into [email or web address] and use [place]'s universal password to access. If you do not have the password, please contact the hospital operator. Locate the [facility] provider you are looking for under [facility] and page to a number that you can be directly reached. If you still have difficulty reaching the provider, please page the [facility] (Director on Call) for the Hospitalists listed on amion for assistance.

## 2020-10-24 NOTE — Progress Notes (Addendum)
Inpatient Diabetes Program Recommendations  AACE/ADA: New Consensus Statement on Inpatient Glycemic Control (2015)  Target Ranges:  Prepandial:   less than 140 mg/dL      Peak postprandial:   less than 180 mg/dL (1-2 hours)      Critically ill patients:  140 - 180 mg/dL   Lab Results  Component Value Date   GLUCAP 210 (H) 10/24/2020   HGBA1C 10.5 (H) 10/21/2020    Review of Glycemic Control Results for Edward [name], Edward [name] (MRN [id number]) as of 10/24/2020 12:41  Ref. Range 10/23/2020 12:19 10/23/2020 17:17 10/23/2020 22:01 10/24/2020 07:57 10/24/2020 11:49  Glucose-Capillary Latest Ref Range: 70 - 99 mg/dL 291 (H) 87 237 (H) 195 (H) 210 (H)  Diabetes history: DM 2 Outpatient Diabetes medications: Invokamet XR 150-1000mg - 2 tabs daily, Dulaglutide 3 mg weekly Current orders for Inpatient glycemic control:  Novolog sensitive tid with meals and HS, Lantus 8 units daily  Inpatient Diabetes Program Recommendations:    Consider slight increase of Lantus to 12 units daily.  Needs close f/u with PCP regarding elevated A1C?  Patient states he was taking his medications, so not sure why A1C increased so significantly.  Watch.  May be able to resume home meds at d/c and then f/u with PCP regarding medication changes if needed.   Thanks,  Adah Perl, RN, BC-ADM Inpatient Diabetes Coordinator Pager [phone]  (8a-5p)

## 2020-10-24 NOTE — Progress Notes (Addendum)
   NAME:  Edward [name], MRN:  [id number], DOB:  Oct 26, 1955, LOS: 3 ADMISSION DATE:  10/21/2020, CONSULTATION DATE: 10/21/2020 REFERRING MD:  Dr. Alvino Chapel, CHIEF COMPLAINT: Large right pleural effusion  Brief narrative:  65 year old male initially presented with complaints of shortness of breath, chest pain, and shoulder pain who was found to have very large right pleural effusion with complete atelectasis of the lung.  PCCM consulted 6/21, pigtail chest tube placed overnight with approximately 2 L removed post insertion.  Lights criteria indicates exudative effusion  Pertinent  Medical History  Diabetes Hypertension  Significant Hospital Events:  6/21 Admitted for shortness of breath, right shoulder pain, and chest pain found To right pleural effusion with complete atelectasis of right lung with mild mediastinal shift 6/22 status post placement pigtail chest tube yesterday evening with approximately 2 L out thus far.  Effusion is exudative by Lights criteria 6/22 CT chest Chest tube output 210 cc / 24h   Interim History / Subjective:   Denies any significant symptoms Still with some significant effluent Cytology negative Cultures negative to date  Objective   Blood pressure 129/78, pulse 95, temperature (!) 97.5 F (36.4 C), resp. rate 20, height 5\' 2"  (1.575 m), weight 60.5 kg, SpO2 99 %.        Intake/Output Summary (Last 24 hours) at 10/24/2020 1053 Last data filed at 10/24/2020 0600 Gross per 24 hour  Intake 347.96 ml  Output 1665 ml  Net -1317.04 ml   Filed Weights   10/22/20 0511 10/23/20 0434 10/24/20 0500  Weight: 58.4 kg 59.3 kg 60.5 kg    Examination: General: Pleasant, not in distress HEENT: Moist oral mucosa Neuro: Awake alert and interactive CV: Heart regular without a murmur PULM: Good air entry bilaterally GI: Bowel sounds appreciated Extremities: No edema Skin: No rash  Labs/imaging that I have personally reviewed   6/21 CT scan chest large right  pleural effusion with complete atelectasis of the right lung and mild mediastinal shift contents to the left, 14 mm possible hyperenhancing nodule in the left hepatic lobe 6/20 chest x-ray significant improvement in effusion with no pneumothorax CT chest 6/22 >> significant improvement in right effusion with right-sided pigtail catheter inferiorly adjacent to right lower lobe, significant reexpansion of the right lung.  Mild thickened appearance of the right pleural surface.  Large scattered areas of infiltrate, airspace opacity in the right upper lobe, no overt mass  Resolved Hospital Problem list     Assessment & Plan:   Large right pleural effusion -Etiology is unclear -Cytology is negative -Exudative effusion, cultures negative  Possibly parapneumonic effusion -Continue antibiotics  Continue chest tube management with flushes -Continue to document fluid effluent -Once draining less than 100 cc in 24 hours we will consider tube removal  Will order for chest x-ray today If complete resolution then will not need advanced imaging, however, if resolution is incomplete, may need repeat CT scan of the chest  Tentatively, anticipating being able to discontinue chest tube 6/25  Signature:   Sherrilyn Rist, MD [place] [facility] Pager: [phone]

## 2020-10-25 ENCOUNTER — Encounter (HOSPITAL_COMMUNITY): Payer: Self-pay | Admitting: Internal Medicine

## 2020-10-25 DIAGNOSIS — I700 Atherosclerosis of aorta: Secondary | ICD-10-CM

## 2020-10-25 HISTORY — DX: Atherosclerosis of aorta: I70.0

## 2020-10-25 LAB — CHOLESTEROL, BODY FLUID: Cholesterol, Fluid: 65 mg/dL

## 2020-10-25 LAB — BODY FLUID CULTURE W GRAM STAIN: Culture: NO GROWTH

## 2020-10-25 LAB — GLUCOSE, CAPILLARY
Glucose-Capillary: 146 mg/dL — ABNORMAL HIGH (ref 70–99)
Glucose-Capillary: 173 mg/dL — ABNORMAL HIGH (ref 70–99)
Glucose-Capillary: 178 mg/dL — ABNORMAL HIGH (ref 70–99)
Glucose-Capillary: 287 mg/dL — ABNORMAL HIGH (ref 70–99)

## 2020-10-25 MED ORDER — SODIUM CHLORIDE (PF) 0.9 % IJ SOLN
10.0000 mg | Freq: Every day | INTRAMUSCULAR | Status: AC
  Administered 2020-10-25 – 2020-10-27 (×3): 10 mg via INTRAPLEURAL
  Filled 2020-10-25 (×3): qty 10

## 2020-10-25 MED ORDER — STERILE WATER FOR INJECTION IJ SOLN
5.0000 mg | Freq: Every day | RESPIRATORY_TRACT | Status: AC
  Administered 2020-10-25 – 2020-10-27 (×3): 5 mg via INTRAPLEURAL
  Filled 2020-10-25 (×3): qty 5

## 2020-10-25 NOTE — Assessment & Plan Note (Addendum)
--   Possibly parapneumonic.  Continue antibiotics PO.  Continue chest tube per pulmonology.  Still with high output.  Thrombolytics 6/25, 6/26, 6/27.  Continues to have significant output.  Repeat chest CT showed resolution of right upper lobe process.  Decrease pleural effusion.  Continue management per pulmonology.

## 2020-10-25 NOTE — Procedures (Signed)
Procedure: instillation of pleural fibrinolytics Indication: loculated pleural effusion Description: 10mg  of tPA in 30cc of saline and 5mg  of dornase in 30cc of sterile water were injected into pleural space using existing pleural catheter.  30cc of saline were used to flush out dead space after.  Catheter will be clamped for 1 hour and then back to suction. Complications: none immediate  Sherrilyn Rist MD

## 2020-10-25 NOTE — Progress Notes (Signed)
   NAME:  Edward [name], MRN:  [id number], DOB:  [birth year], LOS: 4 ADMISSION DATE:  10/21/2020, CONSULTATION DATE: 10/21/2020 REFERRING MD:  Dr. Alvino Chapel, CHIEF COMPLAINT: Large right pleural effusion  Brief narrative:  65 year old male initially presented with complaints of shortness of breath, chest pain, and shoulder pain who was found to have very large right pleural effusion with complete atelectasis of the lung.  PCCM consulted 6/21, pigtail chest tube placed overnight with approximately 2 L removed post insertion.  Lights criteria indicates exudative effusion  Pertinent  Medical History  Diabetes Hypertension  Significant Hospital Events:  6/21 Admitted for shortness of breath, right shoulder pain, and chest pain found To right pleural effusion with complete atelectasis of right lung with mild mediastinal shift 6/22 status post placement pigtail chest tube yesterday evening with approximately 2 L out thus far.  Effusion is exudative by Lights criteria 6/22 CT chest Chest tube output 210 cc / 24h Chest tube output 180 cc over 24 hours 6/25  Interim History / Subjective:   Denies any significant symptoms Still with some significant effluent Cytology negative Cultures negative to date Chest x-ray showing he still has significant fluid  Objective   Blood pressure 126/75, pulse 89, temperature 97.9 F (36.6 C), resp. rate 16, height 5\' 2"  (1.575 m), weight 61.6 kg, SpO2 99 %.        Intake/Output Summary (Last 24 hours) at 10/25/2020 1037 Last data filed at 10/25/2020 0630 Gross per 24 hour  Intake 120 ml  Output 1555 ml  Net -1435 ml   Filed Weights   10/23/20 0434 10/24/20 0500 10/25/20 0500  Weight: 59.3 kg 60.5 kg 61.6 kg    Examination: General: Pleasant, not in distress HEENT: Moist oral mucosa Neuro: Awake and interactive CV: Heart regular without a murmur PULM: Decreased air entry on the right GI: Bowel sounds appreciated  skin: No rash  Labs/imaging that I  have personally reviewed   6/21 CT scan chest large right pleural effusion with complete atelectasis of the right lung and mild mediastinal shift contents to the left, 14 mm possible hyperenhancing nodule in the left hepatic lobe 6/20 chest x-ray significant improvement in effusion with no pneumothorax CT chest 6/22 >> significant improvement in right effusion with right-sided pigtail catheter inferiorly adjacent to right lower lobe, significant reexpansion of the right lung.  Mild thickened appearance of the right pleural surface.  Large scattered areas of infiltrate, airspace opacity in the right upper lobe, no overt mass Chest x-ray still with significant effusion  Resolved Hospital Problem list     Assessment & Plan:   Large right pleural effusion -Exudative fluid -Etiology remains unclear -Cultures negative, cytology negative  Possibly parapneumonic effusion -We will continue antibiotics -Plan will be to discontinue tube was less than 100 cc however, chest x-ray still reveals significant effusion -Instillation of fibrinolytic will be appropriate in this situation.  Will instill thrombolytics today  Tentatively, anticipating being able to discontinue chest tube once effluent is much less and radiologically, we can show that we have cleared most of the fluid  Signature:   Sherrilyn Rist, MD [place] [facility] Pager: [phone]

## 2020-10-25 NOTE — Assessment & Plan Note (Addendum)
--   Control remains suboptimal.  Increase Lantus and meal coverage.  Resume home medications on discharge.

## 2020-10-25 NOTE — Assessment & Plan Note (Signed)
--  consider statin as outpatient

## 2020-10-25 NOTE — Progress Notes (Signed)
  Updated about over 800 cc of fluid since tube was unclamped  Blood-tinged  Patient has no complaints of pain or discomfort  Continue to monitor, chest x-ray in a.m.

## 2020-10-25 NOTE — Assessment & Plan Note (Addendum)
--   Trending up.  Asymptomatic.  Repeat labs to follow-up.  Consider SIADH.

## 2020-10-25 NOTE — TOC Initial Note (Signed)
Transition of Care [facility]) - Initial/Assessment Note    Patient Details  Name: BOSTEN NEWSTROM MRN: [id number] Date of Birth: 07-Mar-1956  Transition of Care [facility]) CM/SW Contact:    Joaquin Courts, RN Phone Number: 10/25/2020, 4:07 PM  Clinical Narrative: CM noted TOC consult to assist with pcp needs.  CM reviewed chart and notes pt has a managed medicare policy, will need to stay inside [place] of providers.  CM called patient;s daughter to discuss this and advise to reach out to insurance carrier for list of in-network providers.  Unable to reach by phone, left a HIPAA compliant VM requesting a call back.                    Expected Discharge Plan: Home/Self Care Barriers to Discharge: No Barriers Identified   Patient Goals and CMS Choice Patient states their goals for this hospitalization and ongoing recovery are:: to return home at [facility]      Expected Discharge Plan and Services Expected Discharge Plan: Home/Self Care                                              Prior Living Arrangements/Services     Patient language and need for interpreter reviewed:: Yes Do you feel safe going back to the place where you live?: Yes      Need for Family Participation in Patient Care: Yes (Comment) Care giver support system in place?: Yes (comment)   Criminal Activity/Legal Involvement Pertinent to Current Situation/Hospitalization: No - Comment as needed  Activities of Daily Living Home Assistive Devices/Equipment: Eyeglasses (reading glasses) ADL Screening (condition at time of admission) Patient's cognitive ability adequate to safely complete daily activities?: No Is the patient deaf or have difficulty hearing?: No Does the patient have difficulty seeing, even when wearing glasses/contacts?: No Does the patient have difficulty concentrating, remembering, or making decisions?: No Patient able to express need for assistance with ADLs?: Yes Does the patient have  difficulty dressing or bathing?: No Independently performs ADLs?: Yes (appropriate for developmental age) Does the patient have difficulty walking or climbing stairs?: No Weakness of Legs: Both Weakness of Arms/Hands: Both  Permission Sought/Granted                  Emotional Assessment Appearance:: Appears stated age         Psych Involvement: No (comment)  Admission diagnosis:  Pleural effusion [J90] Pleural effusion on right [J90] Fever, unspecified fever cause [R50.9] Patient Active Problem List   Diagnosis Date Noted   Aortic atherosclerosis ([place]) 10/25/2020   Hyponatremia 10/22/2020   Pleural effusion on right 10/21/2020   Vitamin B12 deficiency 04/12/2018   Scapular dysfunction 05/26/2017   Left knee pain 02/18/2016   Type 2 diabetes mellitus ([place]) 08/28/2015   Essential hypertension 08/28/2015   Routine general medical examination at a health care facility 08/28/2015   PCP:  Pcp, No Pharmacy:   [facility] [place], [place] - [address] AT [address] [place] [address] Phone: [phone] Fax: [phone]     Social Determinants of Health (SDOH) Interventions    Readmission Risk Interventions No flowsheet data found.

## 2020-10-25 NOTE — Progress Notes (Signed)
PROGRESS NOTE  Edward [name] [name] DOB: [birth year] DOA: 10/21/2020 PCP: Pcp, No  Brief History   65 year old man PMH diabetes mellitus type 2 presented with shoulder pain and shortness of breath.  Admitted for large right pleural effusion treated with chest tube per pulmonology.  Continues to have significant output, no opportunity to remove chest tube yet.  Status post thrombolytics 6/25  A & P  * Pleural effusion on right -- Possibly parapneumonic.  Continue antibiotics.  Continue chest tube per pulmonology.  Still with high output.  Thrombolytics today.  Type 2 diabetes mellitus (HCC) -- Overall stable.  Continue long-acting insulin.  Resume oral medications on discharge.  Hyponatremia -- Trended up.  Asymptomatic.  Probably related to pneumonic process.  Follow intermittently.  Aortic atherosclerosis (HCC) --consider statin as outpatient  Disposition Plan:  Discussion: discussed with Dr. Ander Slade  Status is: Inpatient  Remains inpatient appropriate because:Inpatient level of care appropriate due to severity of illness  Dispo: The patient is from: Home              Anticipated d/c is to: Home              Patient currently is not medically stable to d/c.   Difficult to place patient No  DVT prophylaxis: enoxaparin (LOVENOX) injection 40 mg Start: 10/21/20 2200 SCDs Start: 10/21/20 2111   Code Status: Full Code Level of care: Med-Surg Family Communication: son at bedside  Edward Hodgkins, MD  [facility] Direct contact: see [email or web address] (further directions at bottom of note if needed) 7PM-7AM contact night coverage as at bottom of note 10/25/2020, 12:36 PM  LOS: 4 days    Consults:  Pulmonology    Procedures:  6/25 Instillation of pleural fibrinolytics  Significant Diagnostic Tests:     Micro Data:  BC   Antimicrobials:  Unasyn 6/21 >  Interval History/Subjective  CC: f/u SOB  Seen w/ interpreter Breathing well, eating well   Objective    Vitals:  Vitals:   10/24/20 2117 10/25/20 0439  BP: 119/64 126/75  Pulse: 86 89  Resp: 16 16  Temp: 98.6 F (37 C) 97.9 F (36.6 C)  SpO2: 100% 99%    Exam: Physical Exam Vitals and nursing note reviewed.  Constitutional:      Appearance: Normal appearance.  Cardiovascular:     Rate and Rhythm: Normal rate and regular rhythm.     Heart sounds: No murmur heard. Pulmonary:     Effort: Pulmonary effort is normal. No respiratory distress.     Breath sounds: Normal breath sounds. No wheezing or rales.  Neurological:     Mental Status: He is alert.  Psychiatric:        Mood and Affect: Mood normal.        Behavior: Behavior normal.   I have personally reviewed the data including:   Today's Data  CBG stable  Scheduled Meds:  alteplase (TPA) for intrapleural administration  10 mg Intrapleural Daily   And   pulmozyme (DORNASE) for intrapleural administration  5 mg Intrapleural Daily   amLODipine  10 mg Oral Daily   enoxaparin (LOVENOX) injection  40 mg Subcutaneous Q24H   fentaNYL (SUBLIMAZE) injection  50 mcg Intravenous Once   guaiFENesin  600 mg Oral BID   insulin aspart  0-5 Units Subcutaneous QHS   insulin aspart  0-9 Units Subcutaneous TID WC   insulin glargine  12 Units Subcutaneous Daily   lisinopril  40 mg Oral Daily   Continuous Infusions:  sodium chloride 250 mL (10/23/20 1042)   ampicillin-sulbactam (UNASYN) IV 3 g (10/25/20 1123)    Principal Problem:   Pleural effusion on right Active Problems:   Type 2 diabetes mellitus ([place])   Hyponatremia   Aortic atherosclerosis ([place])   LOS: 4 days   How to contact the [facility] Attending or Consulting provider [place] or covering provider during after hours [place], for this patient?  Check the care team in [facility] and look for a) attending/consulting TRH provider listed and b) the [facility] team listed Log into [email or web address] and use [place]'s universal password to access. If you do not have the password, please contact  the hospital operator. Locate the [facility] provider you are looking for under [facility] and page to a number that you can be directly reached. If you still have difficulty reaching the provider, please page the [facility] (Director on Call) for the Hospitalists listed on amion for assistance.

## 2020-10-26 ENCOUNTER — Inpatient Hospital Stay (HOSPITAL_COMMUNITY): Payer: Medicare HMO

## 2020-10-26 LAB — CULTURE, BLOOD (ROUTINE X 2)
Culture: NO GROWTH
Culture: NO GROWTH

## 2020-10-26 LAB — GLUCOSE, CAPILLARY
Glucose-Capillary: 128 mg/dL — ABNORMAL HIGH (ref 70–99)
Glucose-Capillary: 219 mg/dL — ABNORMAL HIGH (ref 70–99)
Glucose-Capillary: 274 mg/dL — ABNORMAL HIGH (ref 70–99)
Glucose-Capillary: 162 mg/dL — ABNORMAL HIGH (ref 70–99)

## 2020-10-26 MED ORDER — AMOXICILLIN-POT CLAVULANATE 875-125 MG PO TABS
1.0000 | ORAL_TABLET | Freq: Two times a day (BID) | ORAL | Status: AC
  Administered 2020-10-26 – 2020-10-30 (×10): 1 via ORAL
  Filled 2020-10-26 (×10): qty 1

## 2020-10-26 NOTE — Progress Notes (Addendum)
   NAME:  Edward [name], MRN:  [id number], DOB:  Jul 08, 1955, LOS: 5 ADMISSION DATE:  10/21/2020, CONSULTATION DATE: 10/21/2020 REFERRING MD:  Dr. Alvino Chapel, CHIEF COMPLAINT: Large right pleural effusion  Brief narrative:  65 year old male initially presented with complaints of shortness of breath, chest pain, and shoulder pain who was found to have very large right pleural effusion with complete atelectasis of the lung.  PCCM consulted 6/21, pigtail chest tube placed overnight with approximately 2 L removed post insertion.  Lights criteria indicates exudative effusion  Pertinent  Medical History  Diabetes Hypertension  Significant Hospital Events:  6/21 Admitted for shortness of breath, right shoulder pain, and chest pain found To right pleural effusion with complete atelectasis of right lung with mild mediastinal shift 6/22 status post placement pigtail chest tube yesterday evening with approximately 2 L out thus far.  Effusion is exudative by Lights criteria 6/22 CT chest Chest tube output 210 cc / 24h Chest tube output 180 cc over 24 hours 6/25 Chest tube output 1200 cc  Interim History / Subjective:  Did have some chest discomfort, concerned about the red tinge Spoke with his son Edward [name] over the phone  Objective   Blood pressure 115/70, pulse 87, temperature 97.8 F (36.6 C), temperature source Oral, resp. rate 16, height 5\' 2"  (1.575 m), weight 58.1 kg, SpO2 99 %.        Intake/Output Summary (Last 24 hours) at 10/26/2020 0800 Last data filed at 10/26/2020 0700 Gross per 24 hour  Intake 440 ml  Output 3295 ml  Net -2855 ml   Filed Weights   10/24/20 0500 10/25/20 0500 10/26/20 0700  Weight: 60.5 kg 61.6 kg 58.1 kg    Examination: General: Pleasant, not in distress HEENT: Moist oral mucosa CV: Heart regular without a murmur PULM: Improved air movement bilaterally GI: Bowel sounds appreciated  skin: No rash  Labs/imaging that I have personally reviewed   6/21 CT scan  chest large right pleural effusion with complete atelectasis of the right lung and mild mediastinal shift contents to the left, 14 mm possible hyperenhancing nodule in the left hepatic lobe 6/20 chest x-ray significant improvement in effusion with no pneumothorax CT chest 6/22 >> significant improvement in right effusion with right-sided pigtail catheter inferiorly adjacent to right lower lobe, significant reexpansion of the right lung.  Mild thickened appearance of the right pleural surface.  Large scattered areas of infiltrate, airspace opacity in the right upper lobe, no overt mass Chest x-ray still with significant effusion Chest x-ray 6/26 with significantly improved effusion-reviewed by myself, reviewed with the patient and compared with previous chest x-ray, CT also reviewed with the patient  Resolved Hospital Problem list     Assessment & Plan:   Large right pleural effusion -Exudative fluid, cultures negative, cytology negative -Improvement noted with fibrinolytic  Possibly parapneumonic effusion -Continues to improve -We will switch to oral antibiotic -Discontinue Zosyn, switch to Augmentin.  Will instill thrombolytics today  Will monitor for bleeding, monitor for pain  Discussed plan of care with Edward [name]-patient's son  Anticipating being able to discontinue chest tube once effluent is much less and radiological improvement  Sherrilyn Rist, MD [place] [facility] Pager: [phone]

## 2020-10-26 NOTE — Progress Notes (Addendum)
PROGRESS NOTE  GYAN CAMBRE WPY:[id number] DOB: May 15, 1955 DOA: 10/21/2020 PCP: Pcp, No  Brief History   65 year old man PMH diabetes mellitus type 2 presented with shoulder pain and shortness of breath.  Admitted for large right pleural effusion treated with chest tube per pulmonology.  Continues to have significant output, no opportunity to remove chest tube yet.  Status post thrombolytics 6/25 and 6/26.  A & P  * Pleural effusion on right -- Possibly parapneumonic.  Continue antibiotics, now PO.  Continue chest tube per pulmonology.  Still with high output.  Thrombolytics 6/25 with significant output. Repeat thrombolytics today 6/26.  Type 2 diabetes mellitus (HCC) -- Overall stable.  Continue long-acting insulin. May need meal coverage, will follow. Resume oral medications on discharge.  Hyponatremia -- Trended up.  Asymptomatic.  Probably related to pneumonic process.  Follow intermittently.  Aortic atherosclerosis ([place]) --consider statin as outpatient  Disposition Plan:  Discussion: Still significant output.  Continue chest tube, management per pulmonology.  Status is: Inpatient  Remains inpatient appropriate because:Inpatient level of care appropriate due to severity of illness  Dispo: The patient is from: Home              Anticipated d/c is to: Home              Patient currently is not medically stable to d/c.   Difficult to place patient No  DVT prophylaxis: enoxaparin (LOVENOX) injection 40 mg Start: 10/21/20 2200 SCDs Start: 10/21/20 2111   Code Status: Full Code Level of care: Med-Surg Family Communication: daughter at bedside  Murray Hodgkins, MD  [facility] Direct contact: see [email or web address] (further directions at bottom of note if needed) 7PM-7AM contact night coverage as at bottom of note 10/26/2020, 3:13 PM  LOS: 5 days    Consults:  Pulmonology    Procedures:  6/25 Instillation of pleural fibrinolytics 6/26 tPA  Significant Diagnostic Tests:      Micro Data:  BC   Antimicrobials:  Unasyn 6/21 >  Interval History/Subjective  CC: f/u SOB  Feels good  Objective   Vitals:  Vitals:   10/26/20 0525 10/26/20 1500  BP: 115/70 102/61  Pulse: 87 92  Resp: 16 16  Temp: 97.8 F (36.6 C) 99.3 F (37.4 C)  SpO2: 99% 98%    Exam: Physical Exam Vitals and nursing note reviewed.  Constitutional:      Appearance: Normal appearance. He is not ill-appearing.     Comments: Appears well  Cardiovascular:     Rate and Rhythm: Normal rate and regular rhythm.  Pulmonary:     Effort: Pulmonary effort is normal. No respiratory distress.     Breath sounds: Normal breath sounds.  Neurological:     Mental Status: He is alert.  Psychiatric:        Mood and Affect: Mood normal.        Behavior: Behavior normal.   I have personally reviewed the data including:   Today's Data  CBG remains stable  Scheduled Meds:  alteplase (TPA) for intrapleural administration  10 mg Intrapleural Daily   And   pulmozyme (DORNASE) for intrapleural administration  5 mg Intrapleural Daily   amLODipine  10 mg Oral Daily   amoxicillin-clavulanate  1 tablet Oral Q12H   enoxaparin (LOVENOX) injection  40 mg Subcutaneous Q24H   fentaNYL (SUBLIMAZE) injection  50 mcg Intravenous Once   guaiFENesin  600 mg Oral BID   insulin aspart  0-5 Units Subcutaneous QHS   insulin aspart  0-9 Units Subcutaneous TID WC   insulin glargine  12 Units Subcutaneous Daily   lisinopril  40 mg Oral Daily   Continuous Infusions:  sodium chloride 250 mL (10/26/20 0035)    Principal Problem:   Pleural effusion on right Active Problems:   Type 2 diabetes mellitus ([place])   Hyponatremia   Aortic atherosclerosis ([place])   LOS: 5 days   How to contact the [facility] Attending or Consulting provider [place] or covering provider during after hours [place], for this patient?  Check the care team in [facility] and look for a) attending/consulting TRH provider listed and b) the [facility] team  listed Log into [email or web address] and use [place]'s universal password to access. If you do not have the password, please contact the hospital operator. Locate the [facility] provider you are looking for under [facility] and page to a number that you can be directly reached. If you still have difficulty reaching the provider, please page the [facility] (Director on Call) for the Hospitalists listed on amion for assistance.

## 2020-10-26 NOTE — Procedures (Signed)
Procedure: instillation of pleural fibrinolytics Indication: loculated pleural effusion Description: 10mg  of tPA in 30cc of saline and 5mg  of dornase in 30cc of sterile water were injected into pleural space using existing pleural catheter.  10cc of saline were used to flush out dead space after.  Catheter will be clamped for 1 hour and then back to suction. Complications: none immediate  Sherrilyn Rist MD

## 2020-10-27 ENCOUNTER — Inpatient Hospital Stay (HOSPITAL_COMMUNITY): Payer: Medicare HMO

## 2020-10-27 DIAGNOSIS — E871 Hypo-osmolality and hyponatremia: Secondary | ICD-10-CM

## 2020-10-27 DIAGNOSIS — R918 Other nonspecific abnormal finding of lung field: Secondary | ICD-10-CM

## 2020-10-27 DIAGNOSIS — Z9689 Presence of other specified functional implants: Secondary | ICD-10-CM

## 2020-10-27 LAB — GLUCOSE, CAPILLARY
Glucose-Capillary: 175 mg/dL — ABNORMAL HIGH (ref 70–99)
Glucose-Capillary: 174 mg/dL — ABNORMAL HIGH (ref 70–99)
Glucose-Capillary: 238 mg/dL — ABNORMAL HIGH (ref 70–99)
Glucose-Capillary: 244 mg/dL — ABNORMAL HIGH (ref 70–99)

## 2020-10-27 MED ORDER — INSULIN ASPART 100 UNIT/ML IJ SOLN
2.0000 [IU] | Freq: Three times a day (TID) | INTRAMUSCULAR | Status: DC
  Administered 2020-10-27 – 2020-10-28 (×3): 2 [IU] via SUBCUTANEOUS

## 2020-10-27 NOTE — Progress Notes (Signed)
PROGRESS NOTE  Edward [name] VXB:[id number] DOB: Apr 14, 1956 DOA: 10/21/2020 PCP: Pcp, No  Brief History   65 year old man PMH diabetes mellitus type 2 presented with shoulder pain and shortness of breath.  Admitted for large right pleural effusion treated with chest tube per pulmonology.  Continues to have significant output, no opportunity to remove chest tube yet.  Status post thrombolytics 6/25, 6/26, 6/27.  A & P  * Pleural effusion -- Possibly parapneumonic.  Continue antibiotics PO.  Continue chest tube per pulmonology.  Still with high output.  Thrombolytics 6/25, 6/26, 6/27.  Type 2 diabetes mellitus (HCC) --high at times, will add meal coverage.  Continue long-acting insulin. Resume oral medications on discharge.  Hyponatremia -- Trended up.  Asymptomatic.  Probably related to pneumonic process.  Follow intermittently.  Aortic atherosclerosis ([place]) --consider statin as outpatient  Disposition Plan:  Discussion: Still significant output.  Continue chest tube, management per pulmonology.  Status is: Inpatient  Remains inpatient appropriate because:Inpatient level of care appropriate due to severity of illness  Dispo: The patient is from: Home              Anticipated d/c is to: Home              Patient currently is not medically stable to d/c.   Difficult to place patient No  DVT prophylaxis: enoxaparin (LOVENOX) injection 40 mg Start: 10/21/20 2200 SCDs Start: 10/21/20 2111   Code Status: Full Code Level of care: Med-Surg Family Communication: daughter at bedside  Murray Hodgkins, MD  [facility] Direct contact: see [email or web address] (further directions at bottom of note if needed) 7PM-7AM contact night coverage as at bottom of note 10/27/2020, 2:46 PM  LOS: 6 days    Consults:  Pulmonology    Procedures:  6/25, 6/26, 6/27 Instillation of pleural fibrinolytics  Significant Diagnostic Tests:     Micro Data:  BC   Antimicrobials:  Unasyn 6/21 >  6/25 Augmentin 6/26 >  Interval History/Subjective  CC: f/u SOB  Feels normal  Objective   Vitals:  Vitals:   10/27/20 0507 10/27/20 1405  BP: 112/74 (!) 111/57  Pulse: 92 (!) 106  Resp: 18 20  Temp: 97.7 F (36.5 C) 98.9 F (37.2 C)  SpO2: 97% 98%    Exam: Physical Exam Vitals reviewed.  Cardiovascular:     Rate and Rhythm: Normal rate and regular rhythm.     Heart sounds: No murmur heard. Pulmonary:     Effort: Pulmonary effort is normal. No respiratory distress.     Breath sounds: Normal breath sounds.  Neurological:     Mental Status: He is alert.  Psychiatric:        Mood and Affect: Mood normal.        Behavior: Behavior normal.   I have personally reviewed the data including:   Today's Data  CBG remains stable  Scheduled Meds:  amLODipine  10 mg Oral Daily   amoxicillin-clavulanate  1 tablet Oral Q12H   enoxaparin (LOVENOX) injection  40 mg Subcutaneous Q24H   fentaNYL (SUBLIMAZE) injection  50 mcg Intravenous Once   guaiFENesin  600 mg Oral BID   insulin aspart  0-5 Units Subcutaneous QHS   insulin aspart  0-9 Units Subcutaneous TID WC   insulin aspart  2 Units Subcutaneous TID WC   insulin glargine  12 Units Subcutaneous Daily   lisinopril  40 mg Oral Daily   Continuous Infusions:  sodium chloride 250 mL (10/26/20 0035)    Principal  Problem:   Pleural effusion Active Problems:   Type 2 diabetes mellitus (HCC)   Hyponatremia   Aortic atherosclerosis ([place])   Chest tube in place   LOS: 6 days   How to contact the [facility] Attending or Consulting provider [place] or covering provider during after hours [place], for this patient?  Check the care team in [facility] and look for a) attending/consulting TRH provider listed and b) the [facility] team listed Log into [email or web address] and use [place]'s universal password to access. If you do not have the password, please contact the hospital operator. Locate the [facility] provider you are looking for under [facility] and page to a number that you can be directly reached. If you still have difficulty reaching the provider, please page the [facility] (Director on Call) for the Hospitalists listed on amion for assistance.

## 2020-10-27 NOTE — Procedures (Signed)
Pleural Fibrinolytic Administration Procedure Note  Edward [name]  [id number]  December 24, 1955  Date:10/27/20  Time:1:17 PM   Provider Performing:Mekaila Tarnow R Jahara Dail   Procedure: Pleural Fibrinolysis Subsequent day (35521)  Indication(s) Fibrinolysis of complicated pleural effusion  Consent Risks of the procedure as well as the alternatives and risks of each were explained to the patient and/or caregiver.  Consent for the procedure was obtained.   Anesthesia None   Time Out Verified patient identification, verified procedure, site/side was marked, verified correct patient position, special equipment/implants available, medications/allergies/relevant history reviewed, required imaging and test results available.   Sterile Technique Hand hygiene, gloves   Procedure Description Existing pleural catheter was cleaned and accessed in sterile manner.  10mg  of tPA in 30cc of saline and 5mg  of dornase in 30cc of sterile water were injected into pleural space using existing pleural catheter.  Catheter will be clamped for 1 hour and then placed back to suction.   Complications/Tolerance None; patient tolerated the procedure well.  EBL None   Specimen(s) None    Edward Carpen [name], PA-C

## 2020-10-27 NOTE — Progress Notes (Signed)
Inpatient Diabetes Program Recommendations  AACE/ADA: New Consensus Statement on Inpatient Glycemic Control (2015)  Target Ranges:  Prepandial:   less than 140 mg/dL      Peak postprandial:   less than 180 mg/dL (1-2 hours)      Critically ill patients:  140 - 180 mg/dL   Lab Results  Component Value Date   GLUCAP 175 (H) 10/27/2020   HGBA1C 10.5 (H) 10/21/2020    Review of Glycemic Control  Diabetes history: DM2 Outpatient Diabetes medications: Invokamet 150/1000 mg 2 tabs daily, dulaglutide 3 mg weekly Current orders for Inpatient glycemic control: Lantus 12 QD, Novolog 0-9 units TID with meals and 0-5 HS  HgbA1C - 10.5% Post-prandials elevated.  Inpatient Diabetes Program Recommendations:    Consider adding Novolog 2 units TID if eating > 50%.  Will continue to follow.   Thank you. Lorenda Peck, RD, LDN, CDE Inpatient Diabetes Coordinator [phone]

## 2020-10-27 NOTE — Progress Notes (Signed)
NAME:  Edward [name], MRN:  [id number], DOB:  01-Oct-1955, LOS: 6 ADMISSION DATE:  10/21/2020, CONSULTATION DATE: 10/21/2020 REFERRING MD:  Dr. Alvino Chapel, CHIEF COMPLAINT: Large right pleural effusion  Brief narrative:  65 year old male initially presented with complaints of shortness of breath, chest pain, and shoulder pain who was found to have very large right pleural effusion with complete atelectasis of the lung.  PCCM consulted 6/21, pigtail chest tube placed overnight with approximately 2 L removed post insertion.  Lights criteria indicates exudative effusion  Pertinent  Medical History  Diabetes Hypertension  Significant Hospital Events:  6/21 Admitted for shortness of breath, right shoulder pain, and chest pain found To right pleural effusion with complete atelectasis of right lung with mild mediastinal shift 6/22 status post placement pigtail chest tube yesterday evening with approximately 2 L out thus far.  Effusion is exudative by Lights criteria 6/22 CT chest Chest tube output 210 cc / 24h Chest tube output 180 cc over 24 hours 6/25 Chest tube output 1200 cc 6/27 CT output 980cc, final dose intrapleural lytics   Interim History / Subjective:  Did have some chest discomfort, concerned about the red tinge Spoke with his son [name] over the phone  Objective   Blood pressure 112/74, pulse 92, temperature 97.7 F (36.5 C), temperature source Oral, resp. rate 18, height 5\' 2"  (1.575 m), weight 58.3 kg, SpO2 97 %.        Intake/Output Summary (Last 24 hours) at 10/27/2020 1251 Last data filed at 10/27/2020 1219 Gross per 24 hour  Intake 360 ml  Output 2160 ml  Net -1800 ml    Filed Weights   10/25/20 0500 10/26/20 0700 10/27/20 0507  Weight: 61.6 kg 58.1 kg 58.3 kg    Examination: General: Pleasant, not in distress HEENT: Moist oral mucosa CV: Heart regular without a murmur PULM: Improved air movement bilaterally GI: Bowel sounds appreciated  skin: No  rash  Labs/imaging that I have personally reviewed   6/21 CT scan chest large right pleural effusion with complete atelectasis of the right lung and mild mediastinal shift contents to the left, 14 mm possible hyperenhancing nodule in the left hepatic lobe 6/20 chest x-ray significant improvement in effusion with no pneumothorax CT chest 6/22 >> significant improvement in right effusion with right-sided pigtail catheter inferiorly adjacent to right lower lobe, significant reexpansion of the right lung.  Mild thickened appearance of the right pleural surface.  Large scattered areas of infiltrate, airspace opacity in the right upper lobe, no overt mass Chest x-ray still with significant effusion Chest x-ray 6/26 with significantly improved effusion-reviewed by myself, reviewed with the patient and compared with previous chest x-ray, CT also reviewed with the patient     Resolved Hospital Problem list     Assessment & Plan:   Large right pleural effusion, possibly parapneumonic  R Chest tube placed 6/21 P: -exudative fluid, cultures negative, cytology negative -fungal culture pending -Clinically improved today, sitting up in the chair on room air -980cc output from chest tube yesterday, final dose TPA/Dornase today and repeat CXR tomorrow -monitor for pain and bleeding -On Zosyn  6/21-6/25, Augmentin initiated 6/26, likely 7-10 days total antibiotic therapy   Anticipate being able to discontinue chest tube once effluent is much less and radiological improvement    Otilio Carpen Cristino Degroff, PA-C [place] [facility] See Amion for pager If no response to pager , please call [phone] until 7pm After 7:00 pm call Elink  [phone]?[place]

## 2020-10-28 ENCOUNTER — Inpatient Hospital Stay (HOSPITAL_COMMUNITY): Payer: Medicare HMO

## 2020-10-28 DIAGNOSIS — Z0000 Encounter for general adult medical examination without abnormal findings: Secondary | ICD-10-CM

## 2020-10-28 LAB — BASIC METABOLIC PANEL
Anion gap: 7 (ref 5–15)
BUN: 13 mg/dL (ref 8–23)
CO2: 25 mmol/L (ref 22–32)
Calcium: 7.8 mg/dL — ABNORMAL LOW (ref 8.9–10.3)
Chloride: 96 mmol/L — ABNORMAL LOW (ref 98–111)
Creatinine, Ser: 0.7 mg/dL (ref 0.61–1.24)
GFR, Estimated: >60 mL/min (ref >60)
Glucose, Bld: 190 mg/dL — ABNORMAL HIGH (ref 70–99)
Potassium: 4.2 mmol/L (ref 3.5–5.1)
Sodium: 128 mmol/L — ABNORMAL LOW (ref 135–145)

## 2020-10-28 LAB — GLUCOSE, CAPILLARY
Glucose-Capillary: 170 mg/dL — ABNORMAL HIGH (ref 70–99)
Glucose-Capillary: 268 mg/dL — ABNORMAL HIGH (ref 70–99)
Glucose-Capillary: 154 mg/dL — ABNORMAL HIGH (ref 70–99)
Glucose-Capillary: 221 mg/dL — ABNORMAL HIGH (ref 70–99)

## 2020-10-28 LAB — OSMOLALITY: Osmolality: 275 mOsm/kg (ref 275–295)

## 2020-10-28 LAB — CBC
HCT: 38.7 % — ABNORMAL LOW (ref 39.0–52.0)
Hemoglobin: 13 g/dL (ref 13.0–17.0)
MCH: 29.1 pg (ref 26.0–34.0)
MCHC: 33.6 g/dL (ref 30.0–36.0)
MCV: 86.6 fL (ref 80.0–100.0)
Platelets: 560 10*3/uL — ABNORMAL HIGH (ref 150–400)
RBC: 4.47 MIL/uL (ref 4.22–5.81)
RDW: 11.5 % (ref 11.5–15.5)
WBC: 9.9 10*3/uL (ref 4.0–10.5)
nRBC: 0 % (ref 0.0–0.2)

## 2020-10-28 LAB — CREATININE, SERUM
Creatinine, Ser: 0.67 mg/dL (ref 0.61–1.24)
GFR, Estimated: >60 mL/min (ref >60)

## 2020-10-28 MED ORDER — SODIUM CHLORIDE (PF) 0.9 % IJ SOLN
10.0000 mg | Freq: Every day | INTRAMUSCULAR | Status: DC
  Administered 2020-10-29: 10 mg via INTRAPLEURAL
  Filled 2020-10-28 (×2): qty 10

## 2020-10-28 MED ORDER — IOHEXOL 300 MG/ML  SOLN
75.0000 mL | Freq: Once | INTRAMUSCULAR | Status: AC | PRN
  Administered 2020-10-28: 75 mL via INTRAVENOUS

## 2020-10-28 MED ORDER — INSULIN GLARGINE 100 UNIT/ML ~~LOC~~ SOLN
14.0000 [IU] | Freq: Every day | SUBCUTANEOUS | Status: DC
  Administered 2020-10-29 – 2020-10-31 (×3): 14 [IU] via SUBCUTANEOUS
  Filled 2020-10-28 (×4): qty 0.14

## 2020-10-28 MED ORDER — SODIUM CHLORIDE (PF) 0.9 % IJ SOLN
INTRAMUSCULAR | Status: AC
  Administered 2020-10-28: 10 mL
  Filled 2020-10-28: qty 50

## 2020-10-28 MED ORDER — STERILE WATER FOR INJECTION IJ SOLN
5.0000 mg | Freq: Every day | RESPIRATORY_TRACT | Status: DC
  Administered 2020-10-29: 5 mg via INTRAPLEURAL
  Filled 2020-10-28 (×2): qty 5

## 2020-10-28 MED ORDER — INSULIN ASPART 100 UNIT/ML IJ SOLN
3.0000 [IU] | Freq: Three times a day (TID) | INTRAMUSCULAR | Status: DC
  Administered 2020-10-28 – 2020-10-31 (×8): 3 [IU] via SUBCUTANEOUS

## 2020-10-28 NOTE — Progress Notes (Signed)
PROGRESS NOTE  PAL SHELL [id number]:[id number] DOB: 11-Feb-1956 DOA: 10/21/2020 PCP: Pcp, No  Brief History   65 year old man PMH diabetes mellitus type 2 presented with shoulder pain and shortness of breath.  Admitted for large right pleural effusion treated with chest tube per pulmonology.  Continues to have significant output, no opportunity to remove chest tube yet.  Status post thrombolytics 6/25, 6/26, 6/27.  Repeat CT shows resolution of right upper lobe process.  Decreased pleural effusion.  A & P  * Pleural effusion -- Possibly parapneumonic.  Continue antibiotics PO.  Continue chest tube per pulmonology.  Still with high output.  Thrombolytics 6/25, 6/26, 6/27.  Continues to have significant output.  Repeat chest CT showed resolution of right upper lobe process.  Decrease pleural effusion.  Continue management per pulmonology.  Type 2 diabetes mellitus (HCC) -- Control remains suboptimal.  Increase Lantus and meal coverage.  Resume home medications on discharge.  Hyponatremia -- Trending up.  Asymptomatic.  Repeat labs to follow-up.  Consider SIADH.  Aortic atherosclerosis ([place]) --consider statin as outpatient  Disposition Plan:  Discussion: Still significant output.  Continue chest tube, management per pulmonology.  Status is: Inpatient  Remains inpatient appropriate because:Inpatient level of care appropriate due to severity of illness  Dispo: The patient is from: Home              Anticipated d/c is to: Home              Patient currently is not medically stable to d/c.   Difficult to place patient No  DVT prophylaxis: enoxaparin (LOVENOX) injection 40 mg Start: 10/21/20 2200 SCDs Start: 10/21/20 2111   Code Status: Full Code Level of care: Med-Surg Family Communication: daughter at bedside  Murray Hodgkins, MD  [facility] Direct contact: see [email or web address] (further directions at bottom of note if needed) 7PM-7AM contact night coverage as at bottom of  note 10/28/2020, 2:50 PM  LOS: 7 days    Consults:  Pulmonology    Procedures:  6/25, 6/26, 6/27 instillation of pleural fibrinolytics  Significant Diagnostic Tests:     Micro Data:  BC   Antimicrobials:  Unasyn 6/21 > 6/25 Augmentin 6/26 >  Interval History/Subjective  CC: f/u SOB  Feels fine  Objective   Vitals:  Vitals:   10/28/20 0626 10/28/20 1243  BP: 111/69 113/62  Pulse: 90 (!) 108  Resp:  20  Temp: 98.4 F (36.9 C) 98.9 F (37.2 C)  SpO2: 97% 98%    Exam: Physical Exam Vitals reviewed.  Constitutional:      Appearance: Normal appearance.  Cardiovascular:     Rate and Rhythm: Normal rate and regular rhythm.  Pulmonary:     Effort: Pulmonary effort is normal. No respiratory distress.  Neurological:     Mental Status: He is alert.  Psychiatric:        Mood and Affect: Mood normal.        Behavior: Behavior normal.   I have personally reviewed the data including:   Today's Data  Labs pending  Scheduled Meds:  amLODipine  10 mg Oral Daily   amoxicillin-clavulanate  1 tablet Oral Q12H   enoxaparin (LOVENOX) injection  40 mg Subcutaneous Q24H   fentaNYL (SUBLIMAZE) injection  50 mcg Intravenous Once   guaiFENesin  600 mg Oral BID   insulin aspart  0-5 Units Subcutaneous QHS   insulin aspart  0-9 Units Subcutaneous TID WC   insulin aspart  3 Units Subcutaneous TID WC   [  START ON 10/29/2020] insulin glargine  14 Units Subcutaneous Daily   lisinopril  40 mg Oral Daily   sodium chloride (PF)       Continuous Infusions:  sodium chloride 250 mL (10/26/20 0035)    Principal Problem:   Pleural effusion Active Problems:   Type 2 diabetes mellitus ([place])   Hyponatremia   Aortic atherosclerosis ([place])   Chest tube in place   LOS: 7 days   How to contact the [facility] Attending or Consulting provider [employer] or covering provider during after hours [place], for this patient?  Check the care team in [facility] and look for a) attending/consulting TRH provider  listed and b) the [facility] team listed Log into [email or web address] and use [place]'s universal password to access. If you do not have the password, please contact the hospital operator. Locate the [facility] provider you are looking for under [facility] and page to a number that you can be directly reached. If you still have difficulty reaching the provider, please page the [facility] (Director on Call) for the Hospitalists listed on amion for assistance.

## 2020-10-28 NOTE — Progress Notes (Signed)
     [address]       [place] [address]             [phone]        Called to review CT scan. 65 year old gentleman admitted with parapneumonic effusion.  Chest tube was placed on 6/22 and he has received lytic therapy for several days.  Repeat CT scan was performed which showed a small residual hydropneumothorax.  Additionally I do not see a thick rind or pleural peel.  Per discussion with the critical care team the patient is not on any supplemental oxygen and not having much pleuritic symptoms.  Based on the size of of the hydropneumothorax I do not think that thoracoscopy and decortication will provide much benefit given that he is already done well.  There is 1 small pocket that likely will fill with fluid after the chest tube is removed, but likely will improve with ongoing pulmonary toilet and antibiotic therapy.  Notnamed Croucher Bary Leriche

## 2020-10-28 NOTE — Progress Notes (Signed)
   NAME:  ROCHELL MABIE, MRN:  [id number], DOB:  1956-03-20, LOS: 7 ADMISSION DATE:  10/21/2020, CONSULTATION DATE: 10/21/2020 REFERRING MD:  Dr. Alvino Chapel, CHIEF COMPLAINT: Large right pleural effusion  Brief narrative:  65 year old male initially presented with complaints of shortness of breath, chest pain, and shoulder pain who was found to have very large right pleural effusion with complete atelectasis of the lung.  PCCM consulted 6/21, pigtail chest tube placed overnight with approximately 2 L removed post insertion.  Lights criteria indicates exudative effusion  Pertinent  Medical History  Diabetes Hypertension  Significant Hospital Events:  6/21 Admitted for shortness of breath, right shoulder pain, and chest pain found To right pleural effusion with complete atelectasis of right lung with mild mediastinal shift 6/22 status post placement pigtail chest tube yesterday evening with approximately 2 L out thus far.  Effusion is exudative by Lights criteria 6/22 CT chest Chest tube output 210 cc / 24h Chest tube output 180 cc over 24 hours 6/25 Chest tube output 1200 cc 6/27 CT output 980cc, 3rd dose intrapleural lytics  6/28 Repeat CT chest with improved R pleural effusion and mild amount of residual fluid and air   Interim History / Subjective:  Pt sitting up on room air and reports feeling well  Objective   Blood pressure 113/62, pulse (!) 108, temperature 98.9 F (37.2 C), temperature source Oral, resp. rate 20, height 5\' 2"  (1.575 m), weight 56.7 kg, SpO2 98 %.        Intake/Output Summary (Last 24 hours) at 10/28/2020 1600 Last data filed at 10/28/2020 0700 Gross per 24 hour  Intake 250 ml  Output 1280 ml  Net -1030 ml    Filed Weights   10/26/20 0700 10/27/20 0507 10/28/20 0626  Weight: 58.1 kg 58.3 kg 56.7 kg   General: Well-nourished male, alert and in no distress HEENT: MM pink/moist Neuro: Awake, interactive, oriented, moving all extremities CV: s1s2 RRR, no  m/r/g PULM: No distress on room air, right chest tube in place with 500 cc drainage and no insertion site bleeding, mildly decreased air entry right lower lobe GI: soft, bsx4 active  Extremities: warm/dry, no edema  Skin: no rashes or lesions        Resolved Hospital Problem list     Assessment & Plan:   Large right pleural effusion, possibly parapneumonic  R Chest tube placed 6/21 Exudative fluid, cultures negative, cytology negative P: -fungal culture pending -Continues to feel well on room air -Repeat chest CT with improved effusion, small remaining hydropneumothorax -Spoke with Dr. Kipp Brood with [facility], pt would not benefit from VATS, recommends continued antibiotics and pulmonary toilet -As Chest tube still draining and remaining effusion on imaging, continue intrapleural lytics for several more days, resume 6/29 -On Zosyn  6/21-6/25, Augmentin initiated 6/26, likely 7-10 days total antibiotic therapy   Anticipate being able to discontinue chest tube once effluent is much less and radiological improvement    Otilio Carpen Jeptha Hinnenkamp, PA-C  Pulmonary & Critical care See Amion for pager If no response to pager , please call [phone] until 7pm After 7:00 pm call Elink  [phone]?[place]

## 2020-10-29 ENCOUNTER — Inpatient Hospital Stay (HOSPITAL_COMMUNITY): Payer: Medicare HMO

## 2020-10-29 LAB — BASIC METABOLIC PANEL
Anion gap: 7 (ref 5–15)
BUN: 9 mg/dL (ref 8–23)
CO2: 29 mmol/L (ref 22–32)
Calcium: 8.2 mg/dL — ABNORMAL LOW (ref 8.9–10.3)
Chloride: 98 mmol/L (ref 98–111)
Creatinine, Ser: 0.48 mg/dL — ABNORMAL LOW (ref 0.61–1.24)
GFR, Estimated: >60 mL/min (ref >60)
Glucose, Bld: 111 mg/dL — ABNORMAL HIGH (ref 70–99)
Potassium: 4.3 mmol/L (ref 3.5–5.1)
Sodium: 134 mmol/L — ABNORMAL LOW (ref 135–145)

## 2020-10-29 LAB — GLUCOSE, CAPILLARY
Glucose-Capillary: 134 mg/dL — ABNORMAL HIGH (ref 70–99)
Glucose-Capillary: 261 mg/dL — ABNORMAL HIGH (ref 70–99)
Glucose-Capillary: 118 mg/dL — ABNORMAL HIGH (ref 70–99)
Glucose-Capillary: 150 mg/dL — ABNORMAL HIGH (ref 70–99)

## 2020-10-29 MED ORDER — PROSOURCE PLUS PO LIQD: 30.0000 mL | Freq: Every day | ORAL | Status: DC

## 2020-10-29 NOTE — Progress Notes (Signed)
Nutrition Follow-up  DOCUMENTATION CODES:   Not applicable  INTERVENTION:  - continue to encourage PO intakes and family to bring in foods patient prefers.  - will order 30 ml Prosource Plus once/day, each supplement provides 100 kcal and 15 grams protein.    NUTRITION DIAGNOSIS:   Increased nutrient needs related to acute illness as evidenced by estimated needs. -ongoing  GOAL:   Patient will meet greater than or equal to 90% of their needs -met  MONITOR:   PO intake, Labs, Weight trends  ASSESSMENT:   Pt with PMH of DM and HTN admitted with large R pleural effusion from unknown etiology, cannot rule out parapneumonic effusion or oncological process.  He is currently out of the room to Diagnostic Radiology. He has mainly been eating 100% of the meals ordered here over the past 1 week and family has been bringing in foods that patient prefers.   Weight stable 6/22-6/27 and then down 3.5 lb from 6/27 to 6/28. He has not been weighed today. Noted that patient was started on a 1.5 L fluid restriction yesterday. No information documented in the edema section of flow sheet this admission. He is noted to be -12.2 L since admission.    Per notes: - pleural effusion--decreased in sizeon PO abx, chest tube in place with ongoing significant output   Labs reviewed; CBGs: 134 and 261 mg/dl, Na: 134 mmol/l, creatinine: 0.48 mg/dl, Ca: 8.2 mg/dl. Medications reviewed; sliding scale novolog, 3 units novolog TID, 14 units lantus/day.   Diet Order:   Diet Order             Diet heart healthy/carb modified Room service appropriate? Yes; Fluid consistency: Thin; Fluid restriction: 1500 mL Fluid  Diet effective now                   EDUCATION NEEDS:   Not appropriate for education at this time  Skin:  Skin Assessment: Reviewed RN Assessment  Last BM:  6/27  Height:   Ht Readings from Last 1 Encounters:  10/21/20 5' 2"  (1.575 m)    Weight:   Wt Readings from Last 1  Encounters:  10/28/20 56.7 kg      Estimated Nutritional Needs:  Kcal:  1600-1800 Protein:  85-100 grams Fluid:  >1.6 L/day      Jarome Matin, MS, RD, LDN, CNSC Inpatient Clinical Dietitian RD pager # available in AMION  After hours/weekend pager # available in [facility]

## 2020-10-29 NOTE — Progress Notes (Signed)
Patient chest tube unclamped 1 hr post tPA admin by Dr. [name] as ordered.  Pt currently resting comfortably in the chair. Will continue to monitor patient.

## 2020-10-29 NOTE — Progress Notes (Signed)
PROGRESS NOTE    Edward [name]  DJM:[id number] DOB: 1956/04/02 DOA: 10/21/2020 PCP: Pcp, No     Brief Narrative:  Edward [name] is a 65 year old man with past medical history significant of diabetes mellitus type 2 presented with shoulder pain and shortness of breath.  Admitted for large right pleural effusion treated with chest tube per pulmonology.    New events last 24 hours / Subjective: Patient seen with son at bedside was able to translate.  Patient without any significant complaints today.  He had some left elbow pain when he woke up this morning with associated tingling of his left hand.  The tingling of the left hand has been waxing and waning and chronic in nature.  Symptoms now resolved on my examination.  Assessment & Plan:   Principal Problem:   Pleural effusion Active Problems:   Type 2 diabetes mellitus (HCC)   Hyponatremia   Aortic atherosclerosis (HCC)   Chest tube in place   Large right pleural effusion status post chest tube -Chest tube management per pulmonology -No benefit for surgical intervention per cardiothoracic surgery -Continue Augmentin  Diabetes mellitus type 2 -Continue Lantus, NovoLog  Hyponatremia -Question SIADH -Improving  Hypertension -Continue Norvasc, lisinopril  Aortic atherosclerosis -Follow-up as outpatient   DVT prophylaxis:  enoxaparin (LOVENOX) injection 40 mg Start: 10/21/20 2200 SCDs Start: 10/21/20 2111  Code Status:     Code Status Orders  (From admission, onward)           Start     Ordered   10/21/20 2111  Full code  Continuous        10/21/20 2110           Code Status History     This patient has a current code status but no historical code status.      Family Communication: Son at bedside Disposition Plan:  Status is: Inpatient  Remains inpatient appropriate because:Inpatient level of care appropriate due to severity of illness  Dispo: The patient is from: Home              Anticipated d/c is  to: Home              Patient currently is not medically stable to d/c.   Difficult to place patient No      Antimicrobials:  Anti-infectives (From admission, onward)    Start     Dose/Rate Route Frequency Ordered Stop   10/26/20 1000  amoxicillin-clavulanate (AUGMENTIN) 875-125 MG per tablet 1 tablet        1 tablet Oral Every 12 hours 10/26/20 0804 11/01/20 0959   10/22/20 1800  cefTRIAXone (ROCEPHIN) 2 g in sodium chloride 0.9 % 100 mL IVPB  Status:  Discontinued        2 g 200 mL/hr over 30 Minutes Intravenous Every 24 hours 10/21/20 2110 10/21/20 2234   10/22/20 1800  azithromycin (ZITHROMAX) 500 mg in sodium chloride 0.9 % 250 mL IVPB  Status:  Discontinued        500 mg 250 mL/hr over 60 Minutes Intravenous Every 24 hours 10/21/20 2110 10/21/20 2234   10/21/20 2300  Ampicillin-Sulbactam (UNASYN) 3 g in sodium chloride 0.9 % 100 mL IVPB  Status:  Discontinued        3 g 200 mL/hr over 30 Minutes Intravenous Every 6 hours 10/21/20 2210 10/26/20 0804   10/21/20 1645  cefTRIAXone (ROCEPHIN) 1 g in sodium chloride 0.9 % 100 mL IVPB  1 g 200 mL/hr over 30 Minutes Intravenous  Once 10/21/20 1632 10/21/20 1738   10/21/20 1645  azithromycin (ZITHROMAX) 500 mg in sodium chloride 0.9 % 250 mL IVPB        500 mg 250 mL/hr over 60 Minutes Intravenous  Once 10/21/20 1632 10/21/20 1817        Objective: Vitals:   10/28/20 1243 10/28/20 2028 10/29/20 0550 10/29/20 1229  BP: 113/62 115/63 115/74 (!) 113/59  Pulse: (!) 108 98 90 (!) 108  Resp: 20 16 16 20   Temp: 98.9 F (37.2 C) 98.9 F (37.2 C) 98.7 F (37.1 C) 99.3 F (37.4 C)  TempSrc: Oral Oral Oral   SpO2: 98% 96% 97% 98%  Weight:      Height:        Intake/Output Summary (Last 24 hours) at 10/29/2020 1432 Last data filed at 10/29/2020 0426 Gross per 24 hour  Intake 250 ml  Output 100 ml  Net 150 ml   Filed Weights   10/26/20 0700 10/27/20 0507 10/28/20 0626  Weight: 58.1 kg 58.3 kg 56.7 kg    Examination:   General exam: Appears calm and comfortable  Respiratory system: Diminished breath sounds on right, on room air Cardiovascular system: S1 & S2 heard, RRR. No murmurs. No pedal edema. Gastrointestinal system: Abdomen is nondistended, soft and nontender. Normal bowel sounds heard. Central nervous system: Alert and oriented. No focal neurological deficits. Speech clear.  Extremities: Symmetric in appearance  Skin: No rashes, lesions or ulcers on exposed skin  Psychiatry: Judgement and insight appear normal. Mood & affect appropriate.   Data Reviewed: I have personally reviewed following labs and imaging studies  CBC: Recent Labs  Lab 10/28/20 1515  WBC 9.9  HGB 13.0  HCT 38.7*  MCV 86.6  PLT 761*   Basic Metabolic Panel: Recent Labs  Lab 10/28/20 0413 10/28/20 1515 10/29/20 0440  NA  --  128* 134*  K  --  4.2 4.3  CL  --  96* 98  CO2  --  25 29  GLUCOSE  --  190* 111*  BUN  --  13 9  CREATININE 0.67 0.70 0.48*  CALCIUM  --  7.8* 8.2*   GFR: Estimated Creatinine Clearance: 71.1 mL/min (A) (by C-G formula based on SCr of 0.48 mg/dL (L)). Liver Function Tests: No results for input(s): AST, ALT, ALKPHOS, BILITOT, PROT, ALBUMIN in the last 168 hours. No results for input(s): LIPASE, AMYLASE in the last 168 hours. No results for input(s): AMMONIA in the last 168 hours. Coagulation Profile: No results for input(s): INR, PROTIME in the last 168 hours. Cardiac Enzymes: No results for input(s): CKTOTAL, CKMB, CKMBINDEX, TROPONINI in the last 168 hours. BNP (last 3 results) No results for input(s): PROBNP in the last 8760 hours. HbA1C: No results for input(s): HGBA1C in the last 72 hours. CBG: Recent Labs  Lab 10/28/20 1138 10/28/20 1714 10/28/20 2148 10/29/20 0733 10/29/20 1137  GLUCAP 268* 154* 221* 134* 261*   Lipid Profile: No results for input(s): CHOL, HDL, LDLCALC, TRIG, CHOLHDL, LDLDIRECT in the last 72 hours. Thyroid Function Tests: No results for input(s):  TSH, T4TOTAL, FREET4, T3FREE, THYROIDAB in the last 72 hours. Anemia Panel: No results for input(s): VITAMINB12, FOLATE, FERRITIN, TIBC, IRON, RETICCTPCT in the last 72 hours. Sepsis Labs: Recent Labs  Lab 10/23/20 0532  PROCALCITON <0.10    Recent Results (from the past 240 hour(s))  Blood culture (routine x 2)     Status: None   Collection  Time: 10/21/20  4:26 PM   Specimen: BLOOD  Result Value Ref Range Status   Specimen Description   Final    BLOOD BLOOD RIGHT FOREARM Performed at [facility] [address] [place], [place] [address]    Special Requests   Final    BOTTLES DRAWN AEROBIC AND ANAEROBIC Blood Culture results may not be optimal due to an excessive volume of blood received in culture bottles Performed at [facility] [address], [place], [place] [address]    Culture   Final    NO GROWTH 5 DAYS Performed at [facility], [place] [address] [place], [place] [address]    Report Status 10/26/2020 FINAL  Final  Blood culture (routine x 2)     Status: None   Collection Time: 10/21/20  4:26 PM   Specimen: BLOOD  Result Value Ref Range Status   Specimen Description   Final    BLOOD BLOOD LEFT FOREARM Performed at [facility] [address] [place], [place] [address]    Special Requests   Final    BOTTLES DRAWN AEROBIC AND ANAEROBIC Blood Culture results may not be optimal due to an excessive volume of blood received in culture bottles Performed at [facility] [address], [place], [place] [address]    Culture   Final    NO GROWTH 5 DAYS Performed at [facility], [place] [address], [place], [place] [address]    Report Status 10/26/2020 FINAL  Final  Resp Panel by RT-PCR (Flu A&B, Covid) Nasopharyngeal Swab     Status: None   Collection Time: 10/21/20  4:27 PM   Specimen: Nasopharyngeal Swab; Nasopharyngeal(NP) swabs in vial transport medium  Result Value Ref Range  Status   SARS Coronavirus 2 by RT PCR NEGATIVE NEGATIVE Final    Comment: (NOTE) SARS-CoV-2 target nucleic acids are NOT DETECTED.  The SARS-CoV-2 RNA is generally detectable in upper respiratory specimens during the acute phase of infection. The lowest concentration of SARS-CoV-2 viral copies this assay can detect is 138 copies/mL. A negative result does not preclude SARS-Cov-2 infection and should not be used as the sole basis for treatment or other patient management decisions. A negative result may occur with  improper specimen collection/handling, submission of specimen other than nasopharyngeal swab, presence of viral mutation(s) within the areas targeted by this assay, and inadequate number of viral copies(<138 copies/mL). A negative result must be combined with clinical observations, patient history, and epidemiological information. The expected result is Negative.  Fact Sheet for Patients:  EntrepreneurPulse.com.au  Fact Sheet for Healthcare Providers:  IncredibleEmployment.be  This test is no t yet approved or cleared by the [place] FDA and  has been authorized for detection and/or diagnosis of SARS-CoV-2 by FDA under an Emergency Use Authorization (EUA). This EUA will remain  in effect (meaning this test can be used) for the duration of the COVID-19 declaration under Section 564(b)(1) of the Act, 21 U.S.C.section 360bbb-3(b)(1), unless the authorization is terminated  or revoked sooner.       Influenza A by PCR NEGATIVE NEGATIVE Final   Influenza B by PCR NEGATIVE NEGATIVE Final    Comment: (NOTE) The Xpert Xpress SARS-CoV-2/FLU/RSV plus assay is intended as an aid in the diagnosis of influenza from Nasopharyngeal swab specimens and should not be used as a sole basis for treatment. Nasal washings and aspirates are unacceptable for Xpert Xpress SARS-CoV-2/FLU/RSV testing.  Fact Sheet for  Patients: EntrepreneurPulse.com.au  Fact Sheet for Healthcare Providers: IncredibleEmployment.be  This test is not yet approved or cleared by the [place] FDA and has been authorized for detection and/or diagnosis of SARS-CoV-2 by FDA under an Emergency Use Authorization (EUA). This EUA will remain in effect (meaning this test can be used) for the duration of the COVID-19 declaration under Section 564(b)(1) of the Act, 21 U.S.C. section 360bbb-3(b)(1), unless the authorization is terminated or revoked.  Performed at [facility], [place] [address] [place], [place] [address]   Body fluid culture w Gram Stain     Status: None   Collection Time: 10/21/20  8:58 PM   Specimen: Pleural Fluid  Result Value Ref Range Status   Specimen Description   Final    PLEURAL FLUID Performed at [facility], [place] [address] [place], [place] [address]    Special Requests   Final    NONE Performed at [facility], [place] [address] [place], [place] [address]    Gram Stain   Final    FEW WBC PRESENT, PREDOMINANTLY MONONUCLEAR NO ORGANISMS SEEN    Culture   Final    NO GROWTH 3 DAYS Performed at [facility] [address] [place], [place] [address]    Report Status 10/25/2020 FINAL  Final  Fungus Culture With Stain     Status: None (Preliminary result)   Collection Time: 10/21/20  8:58 PM   Specimen: Pleural Fluid  Result Value Ref Range Status   Fungus Stain Final report  Final    Comment: (NOTE) Performed At: [facility] [address] [place], [place] [id number] Rush Farmer MD IO:[id number]    Fungus (Mycology) Culture PENDING  Incomplete   Fungal Source PLEURAL  Final    Comment: Performed at [facility], [place] [address] [place], [place] [address]  Fungus Culture Result     Status: None   Collection Time: 10/21/20  8:58 PM  Result Value Ref Range Status   Result  1 Comment  Final    Comment: (NOTE) KOH/Calcofluor preparation:  no fungus observed. Performed At: [facility] [place], [place] [id number] Rush Farmer MD QI:[id number]       Radiology Studies: CT CHEST W CONTRAST  Result Date: 10/28/2020 CLINICAL DATA:  Pleural effusion. EXAM: CT CHEST WITH CONTRAST TECHNIQUE: Multidetector CT imaging of the chest was performed during intravenous contrast administration. CONTRAST:  68mL OMNIPAQUE IOHEXOL 300 MG/ML  SOLN COMPARISON:  October 22, 2020. FINDINGS: Cardiovascular: No evidence of thoracic aortic aneurysm or dissection. Normal cardiac size. No pericardial effusion. Coronary artery calcifications are noted. Mediastinum/Nodes: Stable left thyroid nodule measuring 13 mm. No adenopathy is noted. The esophagus is unremarkable. Lungs/Pleura: Left lung is clear. Stable position of right basilar pleural drainage catheter. Right pleural effusion is significantly smaller compared to prior exam. However, mild amount of residual fluid and air remains predominantly posteriorly. Mild right basilar subsegmental atelectasis is noted. Right upper lobe airspace opacity noted on prior exam appears to have resolved. Upper Abdomen: Stable enhancing abnormality seen anteriorly in left hepatic lobe most consistent with hemangioma. Musculoskeletal: No chest wall abnormality. No acute or significant osseous findings. IMPRESSION: Stable position of right basilar pleural drainage catheter. Right pleural effusion is significantly smaller compared to prior exam, although mild amount of residual fluid and air remains predominantly posteriorly. Mild right basilar subsegmental atelectasis is noted. Right upper lobe airspace opacity noted on prior exam has resolved. Stable probable  hemangioma seen in left hepatic lobe. Coronary artery calcifications are noted. Electronically Signed   By: Marijo Conception M.D.   On: 10/28/2020 11:35   DG CHEST PORT 1 VIEW  Result  Date: 10/28/2020 CLINICAL DATA:  65 year old male with pleural effusion EXAM: PORTABLE CHEST 1 VIEW COMPARISON:  Chest radiograph dated 10/27/2020. FINDINGS: Right pleural drainage catheter in similar position. Slight interval decrease in the size of the right pleural effusion. Small right pleural effusion and associated atelectasis noted. The left lung is clear. No pneumothorax. Stable cardiomediastinal silhouette. No acute osseous pathology. IMPRESSION: Slight interval decrease in the size of the right pleural effusion. Electronically Signed   By: Anner Crete M.D.   On: 10/28/2020 20:33      Scheduled Meds:  (feeding supplement) PROSource Plus  30 mL Oral Daily   alteplase (TPA) for intrapleural administration  10 mg Intrapleural Daily   And   pulmozyme (DORNASE) for intrapleural administration  5 mg Intrapleural Daily   amLODipine  10 mg Oral Daily   amoxicillin-clavulanate  1 tablet Oral Q12H   enoxaparin (LOVENOX) injection  40 mg Subcutaneous Q24H   fentaNYL (SUBLIMAZE) injection  50 mcg Intravenous Once   guaiFENesin  600 mg Oral BID   insulin aspart  0-5 Units Subcutaneous QHS   insulin aspart  0-9 Units Subcutaneous TID WC   insulin aspart  3 Units Subcutaneous TID WC   insulin glargine  14 Units Subcutaneous Daily   lisinopril  40 mg Oral Daily   Continuous Infusions:  sodium chloride 250 mL (10/26/20 0035)     LOS: 8 days      Time spent: 25 minutes   Dessa Phi, DO [facility] 10/29/2020, 2:32 PM   Available via Epic secure chat 7am-7pm After these hours, please refer to coverage provider listed on [email or web address]

## 2020-10-29 NOTE — Progress Notes (Signed)
   NAME:  Edward [name], MRN:  [id number], DOB:  June 18, 1955, LOS: 8 ADMISSION DATE:  10/21/2020, CONSULTATION DATE: 10/21/2020 REFERRING MD:  Dr. Alvino Chapel, CHIEF COMPLAINT: Large right pleural effusion  Brief narrative:  66 year old male initially presented with complaints of shortness of breath, chest pain, and shoulder pain who was found to have very large right pleural effusion with complete atelectasis of the lung.  PCCM consulted 6/21, pigtail chest tube placed overnight with approximately 2 L removed post insertion.  Lights criteria indicates exudative effusion  Pertinent  Medical History  Diabetes Hypertension  Significant Hospital Events:  6/21 Admitted for shortness of breath, right shoulder pain, and chest pain found To right pleural effusion with complete atelectasis of right lung with mild mediastinal shift 6/22 status post placement pigtail chest tube yesterday evening with approximately 2 L out thus far.  Effusion is exudative by Lights criteria 6/22 CT chest Chest tube output 210 cc / 24h Chest tube output 180 cc over 24 hours 6/25 Chest tube output 1200 cc 6/27 CT output 980cc, 3rd dose intrapleural lytics  6/28 Repeat CT chest with improved R pleural effusion and mild amount of residual fluid and air   Interim History / Subjective:  Sitting up in the chair eating breakfast. Son at bedside. Additional history obtained-- had covid in January with coughing, but not severely ill. In march he had chest pressure and had to sleep on his R side due to the pressure, but never had pain with it. Pressure persisted until he has had the tube placed. Today he has no new complaints.  Objective   Blood pressure 115/74, pulse 90, temperature 98.7 F (37.1 C), temperature source Oral, resp. rate 16, height 5\' 2"  (1.575 m), weight 56.7 kg, SpO2 97 %.        Intake/Output Summary (Last 24 hours) at 10/29/2020 1015 Last data filed at 10/29/2020 0426 Gross per 24 hour  Intake 250 ml  Output  100 ml  Net 150 ml    Filed Weights   10/26/20 0700 10/27/20 0507 10/28/20 0626  Weight: 58.1 kg 58.3 kg 56.7 kg   General: healthy appearing elderly man sitting up in the chair in NAD HEENT: [place]/AT, eyes anicteric Neuro: awake and alert, answering questions, talking to son CV: S1S2, RRR PULM: Breathing comfortably on RA, no significant drainage since yesterday afternoon in chest tube-- ~1400cc in atrium. CTAB.  GI: soft, NT, ND Extremities: no cyanosis or edema Skin: no rashes or wounds    Resolved Hospital Problem list     Assessment & Plan:   Large right pleural effusion, possibly residual of previous parapneumonic effusion, which over time would have become more lymphocyte predominant. Negative cytology.  Residual small hydropneumothorax. P: -AM CXR -fungal culture pending-- remains negative -con't chest tube to -20cm H2O.  -additional dose of TPA and DNAse given today. Will continue to work towards the CXR normalizing as it has continued to improve with itnrapleural lytics. -Have discussed with TCTS- no benefit for surgical intervention. -On Zosyn  6/21-6/25, Augmentin initiated 6/26, likely 7-10 days total antibiotic therapy  Son updated at bedside.  [name], DO 10/29/20 10:22 AM [facility]

## 2020-10-29 NOTE — Progress Notes (Signed)
PCCM Progress Note  Called to room by bedside RN stating patient has developed chest tube pain this afternoon with limited output post tPA/Dornase. On assessment chest tube is unclamped and there is dark red drainage seen in collection tubing. Chest tube flushed well. Pain was improving post PO opoid's.    Repeat CXR now and follow up hgb in AM  Yvan Dority D. Kenton Kingfisher, NP-C [facility] Personal contact information can be found on Amion  10/29/2020, 7:02 PM

## 2020-10-29 NOTE — Procedures (Signed)
Pleural Fibrinolytic Administration Procedure Note  Edward [name]  [id number]  [birth year]  Date:10/29/20  Time:10:14 AM   Provider Performing:Edward [name] P Edward [name]   Procedure: Pleural Fibrinolysis Subsequent day (55732)  Indication(s) Fibrinolysis of complicated pleural effusion  Consent Risks of the procedure as well as the alternatives and risks of each were explained to the patient and/or caregiver.  Consent for the procedure was obtained.   Anesthesia None   Time Out Verified patient identification, verified procedure, site/side was marked, verified correct patient position, special equipment/implants available, medications/allergies/relevant history reviewed, required imaging and test results available.   Sterile Technique Hand hygiene, gloves   Procedure Description Existing pleural catheter was cleaned and accessed in sterile manner.  10mg  of tPA in 30cc of saline and 5mg  of dornase in 30cc of sterile water were injected into pleural space using existing pleural catheter.  Catheter will be clamped for 1 hour and then placed back to suction.   Complications/Tolerance None; patient tolerated the procedure well.   EBL None   Specimen(s) None  Edward Hy, DO 10/29/20 10:14 AM [facility]

## 2020-10-29 NOTE — Care Management Important Message (Signed)
Important Message  Patient Details IM Letter placed in Patient's room. Name: NIVIN BRANIFF MRN: [id number] Date of Birth: [birth year]   Medicare Important Message Given:  Yes     Kerin Salen 10/29/2020, 1:32 PM

## 2020-10-30 ENCOUNTER — Inpatient Hospital Stay (HOSPITAL_COMMUNITY): Payer: Medicare HMO

## 2020-10-30 LAB — BASIC METABOLIC PANEL
Anion gap: 9 (ref 5–15)
BUN: 11 mg/dL (ref 8–23)
CO2: 28 mmol/L (ref 22–32)
Calcium: 8.3 mg/dL — ABNORMAL LOW (ref 8.9–10.3)
Chloride: 94 mmol/L — ABNORMAL LOW (ref 98–111)
Creatinine, Ser: 0.57 mg/dL — ABNORMAL LOW (ref 0.61–1.24)
GFR, Estimated: >60 mL/min (ref >60)
Glucose, Bld: 128 mg/dL — ABNORMAL HIGH (ref 70–99)
Potassium: 4.2 mmol/L (ref 3.5–5.1)
Sodium: 131 mmol/L — ABNORMAL LOW (ref 135–145)

## 2020-10-30 LAB — GLUCOSE, CAPILLARY
Glucose-Capillary: 128 mg/dL — ABNORMAL HIGH (ref 70–99)
Glucose-Capillary: 248 mg/dL — ABNORMAL HIGH (ref 70–99)
Glucose-Capillary: 107 mg/dL — ABNORMAL HIGH (ref 70–99)
Glucose-Capillary: 195 mg/dL — ABNORMAL HIGH (ref 70–99)

## 2020-10-30 LAB — CBC
HCT: 36.2 % — ABNORMAL LOW (ref 39.0–52.0)
Hemoglobin: 11.9 g/dL — ABNORMAL LOW (ref 13.0–17.0)
MCH: 29.2 pg (ref 26.0–34.0)
MCHC: 32.9 g/dL (ref 30.0–36.0)
MCV: 88.7 fL (ref 80.0–100.0)
Platelets: 571 10*3/uL — ABNORMAL HIGH (ref 150–400)
RBC: 4.08 MIL/uL — ABNORMAL LOW (ref 4.22–5.81)
RDW: 11.6 % (ref 11.5–15.5)
WBC: 9 10*3/uL (ref 4.0–10.5)
nRBC: 0 % (ref 0.0–0.2)

## 2020-10-30 NOTE — Progress Notes (Signed)
PROGRESS NOTE    Edward [name]  ENI:[id number] DOB: 24-Oct-1955 DOA: 10/21/2020 PCP: Pcp, No     Brief Narrative:  Edward [name] is a 65 year old man with past medical history significant of diabetes mellitus type 2 presented with shoulder pain and shortness of breath.  Admitted for large right pleural effusion treated with chest tube per pulmonology.    New events last 24 hours / Subjective: Patient seen with iPad interpreter.  Without any complaints this morning.  Denies any shortness of breath.  Assessment & Plan:   Principal Problem:   Pleural effusion Active Problems:   Type 2 diabetes mellitus (HCC)   Hyponatremia   Aortic atherosclerosis (HCC)   Chest tube in place   Large right pleural effusion status post chest tube -Chest tube management per pulmonology -No benefit for surgical intervention per cardiothoracic surgery -Augmentin to complete 10-day treatment (Unasyn started 6/21)   Diabetes mellitus type 2 -Continue Lantus, NovoLog  Hyponatremia -Question SIADH -Stable  Hypertension -Continue Norvasc, lisinopril  Aortic atherosclerosis -Follow-up as outpatient   DVT prophylaxis:  enoxaparin (LOVENOX) injection 40 mg Start: 10/21/20 2200 SCDs Start: 10/21/20 2111  Code Status:     Code Status Orders  (From admission, onward)           Start     Ordered   10/21/20 2111  Full code  Continuous        10/21/20 2110           Code Status History     This patient has a current code status but no historical code status.      Family Communication: Son at bedside Disposition Plan:  Status is: Inpatient  Remains inpatient appropriate because:Inpatient level of care appropriate due to severity of illness  Dispo: The patient is from: Home              Anticipated d/c is to: Home              Patient currently is not medically stable to d/c.   Difficult to place patient No      Antimicrobials:  Anti-infectives (From admission, onward)     Start     Dose/Rate Route Frequency Ordered Stop   10/26/20 1000  amoxicillin-clavulanate (AUGMENTIN) 875-125 MG per tablet 1 tablet        1 tablet Oral Every 12 hours 10/26/20 0804 11/01/20 0959   10/22/20 1800  cefTRIAXone (ROCEPHIN) 2 g in sodium chloride 0.9 % 100 mL IVPB  Status:  Discontinued        2 g 200 mL/hr over 30 Minutes Intravenous Every 24 hours 10/21/20 2110 10/21/20 2234   10/22/20 1800  azithromycin (ZITHROMAX) 500 mg in sodium chloride 0.9 % 250 mL IVPB  Status:  Discontinued        500 mg 250 mL/hr over 60 Minutes Intravenous Every 24 hours 10/21/20 2110 10/21/20 2234   10/21/20 2300  Ampicillin-Sulbactam (UNASYN) 3 g in sodium chloride 0.9 % 100 mL IVPB  Status:  Discontinued        3 g 200 mL/hr over 30 Minutes Intravenous Every 6 hours 10/21/20 2210 10/26/20 0804   10/21/20 1645  cefTRIAXone (ROCEPHIN) 1 g in sodium chloride 0.9 % 100 mL IVPB        1 g 200 mL/hr over 30 Minutes Intravenous  Once 10/21/20 1632 10/21/20 1738   10/21/20 1645  azithromycin (ZITHROMAX) 500 mg in sodium chloride 0.9 % 250 mL IVPB  500 mg 250 mL/hr over 60 Minutes Intravenous  Once 10/21/20 1632 10/21/20 1817        Objective: Vitals:   10/29/20 1229 10/29/20 2041 10/30/20 0511 10/30/20 0926  BP: (!) 113/59 107/61 116/68 120/62  Pulse: (!) 108 99 90   Resp: 20 16 16    Temp: 99.3 F (37.4 C) 98.8 F (37.1 C) 98 F (36.7 C)   TempSrc:      SpO2: 98% 96% 98%   Weight:      Height:        Intake/Output Summary (Last 24 hours) at 10/30/2020 1300 Last data filed at 10/30/2020 0110 Gross per 24 hour  Intake --  Output 1050 ml  Net -1050 ml    Filed Weights   10/26/20 0700 10/27/20 0507 10/28/20 0626  Weight: 58.1 kg 58.3 kg 56.7 kg   Examination: General exam: Appears calm and comfortable  Respiratory system: Diminished breath sounds on right, chest tube in place, on room air, no conversational dyspnea Cardiovascular system: S1 & S2 heard, RRR. No pedal  edema. Gastrointestinal system: Abdomen is nondistended, soft and nontender. Normal bowel sounds heard. Central nervous system: Alert and oriented. Non focal exam. Speech clear  Extremities: Symmetric in appearance bilaterally  Skin: No rashes, lesions or ulcers on exposed skin  Psychiatry: Judgement and insight appear stable. Mood & affect appropriate.    Data Reviewed: I have personally reviewed following labs and imaging studies  CBC: Recent Labs  Lab 10/28/20 1515 10/30/20 0435  WBC 9.9 9.0  HGB 13.0 11.9*  HCT 38.7* 36.2*  MCV 86.6 88.7  PLT 560* 571*    Basic Metabolic Panel: Recent Labs  Lab 10/28/20 0413 10/28/20 1515 10/29/20 0440 10/30/20 0435  NA  --  128* 134* 131*  K  --  4.2 4.3 4.2  CL  --  96* 98 94*  CO2  --  25 29 28   GLUCOSE  --  190* 111* 128*  BUN  --  13 9 11   CREATININE 0.67 0.70 0.48* 0.57*  CALCIUM  --  7.8* 8.2* 8.3*    GFR: Estimated Creatinine Clearance: 71.1 mL/min (A) (by C-G formula based on SCr of 0.57 mg/dL (L)). Liver Function Tests: No results for input(s): AST, ALT, ALKPHOS, BILITOT, PROT, ALBUMIN in the last 168 hours. No results for input(s): LIPASE, AMYLASE in the last 168 hours. No results for input(s): AMMONIA in the last 168 hours. Coagulation Profile: No results for input(s): INR, PROTIME in the last 168 hours. Cardiac Enzymes: No results for input(s): CKTOTAL, CKMB, CKMBINDEX, TROPONINI in the last 168 hours. BNP (last 3 results) No results for input(s): PROBNP in the last 8760 hours. HbA1C: No results for input(s): HGBA1C in the last 72 hours. CBG: Recent Labs  Lab 10/29/20 1137 10/29/20 1717 10/29/20 2041 10/30/20 0737 10/30/20 1206  GLUCAP 261* 118* 150* 128* 248*    Lipid Profile: No results for input(s): CHOL, HDL, LDLCALC, TRIG, CHOLHDL, LDLDIRECT in the last 72 hours. Thyroid Function Tests: No results for input(s): TSH, T4TOTAL, FREET4, T3FREE, THYROIDAB in the last 72 hours. Anemia Panel: No  results for input(s): VITAMINB12, FOLATE, FERRITIN, TIBC, IRON, RETICCTPCT in the last 72 hours. Sepsis Labs: No results for input(s): PROCALCITON, LATICACIDVEN in the last 168 hours.   Recent Results (from the past 240 hour(s))  Blood culture (routine x 2)     Status: None   Collection Time: 10/21/20  4:26 PM   Specimen: BLOOD  Result Value Ref Range Status  Specimen Description   Final    BLOOD BLOOD RIGHT FOREARM Performed at [facility] [address], [place], [place] [address]    Special Requests   Final    BOTTLES DRAWN AEROBIC AND ANAEROBIC Blood Culture results may not be optimal due to an excessive volume of blood received in culture bottles Performed at [facility] [address] [place], [place] [address]    Culture   Final    NO GROWTH 5 DAYS Performed at [facility], [place] [address] [place], [place] [address]    Report Status 10/26/2020 FINAL  Final  Blood culture (routine x 2)     Status: None   Collection Time: 10/21/20  4:26 PM   Specimen: BLOOD  Result Value Ref Range Status   Specimen Description   Final    BLOOD BLOOD LEFT FOREARM Performed at [facility] [address] [place], [place] [address]    Special Requests   Final    BOTTLES DRAWN AEROBIC AND ANAEROBIC Blood Culture results may not be optimal due to an excessive volume of blood received in culture bottles Performed at [facility] [address], [place], [place] [address]    Culture   Final    NO GROWTH 5 DAYS Performed at [facility], [place] [address], [place], [place] [address]    Report Status 10/26/2020 FINAL  Final  Resp Panel by RT-PCR (Flu A&B, Covid) Nasopharyngeal Swab     Status: None   Collection Time: 10/21/20  4:27 PM   Specimen: Nasopharyngeal Swab; Nasopharyngeal(NP) swabs in vial transport medium  Result Value Ref Range Status   SARS Coronavirus 2 by RT PCR NEGATIVE NEGATIVE Final     Comment: (NOTE) SARS-CoV-2 target nucleic acids are NOT DETECTED.  The SARS-CoV-2 RNA is generally detectable in upper respiratory specimens during the acute phase of infection. The lowest concentration of SARS-CoV-2 viral copies this assay can detect is 138 copies/mL. A negative result does not preclude SARS-Cov-2 infection and should not be used as the sole basis for treatment or other patient management decisions. A negative result may occur with  improper specimen collection/handling, submission of specimen other than nasopharyngeal swab, presence of viral mutation(s) within the areas targeted by this assay, and inadequate number of viral copies(<138 copies/mL). A negative result must be combined with clinical observations, patient history, and epidemiological information. The expected result is Negative.  Fact Sheet for Patients:  EntrepreneurPulse.com.au  Fact Sheet for Healthcare Providers:  IncredibleEmployment.be  This test is no t yet approved or cleared by the [place] FDA and  has been authorized for detection and/or diagnosis of SARS-CoV-2 by FDA under an Emergency Use Authorization (EUA). This EUA will remain  in effect (meaning this test can be used) for the duration of the COVID-19 declaration under Section 564(b)(1) of the Act, 21 U.S.C.section 360bbb-3(b)(1), unless the authorization is terminated  or revoked sooner.       Influenza A by PCR NEGATIVE NEGATIVE Final   Influenza B by PCR NEGATIVE NEGATIVE Final    Comment: (NOTE) The Xpert Xpress SARS-CoV-2/FLU/RSV plus assay is intended as an aid in the diagnosis of influenza from Nasopharyngeal swab specimens and should not be used as a sole basis for treatment. Nasal washings and aspirates are unacceptable for Xpert Xpress SARS-CoV-2/FLU/RSV testing.  Fact Sheet for Patients: EntrepreneurPulse.com.au  Fact Sheet for Healthcare  Providers: IncredibleEmployment.be  This test is not yet approved or  cleared by the [place] and has been authorized for detection and/or diagnosis of SARS-CoV-2 by FDA under an Emergency Use Authorization (EUA). This EUA will remain in effect (meaning this test can be used) for the duration of the COVID-19 declaration under Section 564(b)(1) of the Act, 21 U.S.C. section 360bbb-3(b)(1), unless the authorization is terminated or revoked.  Performed at [facility], [place] [address], [place], [place] [address]   Body fluid culture w Gram Stain     Status: None   Collection Time: 10/21/20  8:58 PM   Specimen: Pleural Fluid  Result Value Ref Range Status   Specimen Description   Final    PLEURAL FLUID Performed at [facility], [place] [address] [place], [place] [address]    Special Requests   Final    NONE Performed at [facility], [place] [address] [place], [place] [address]    Gram Stain   Final    FEW WBC PRESENT, PREDOMINANTLY MONONUCLEAR NO ORGANISMS SEEN    Culture   Final    NO GROWTH 3 DAYS Performed at [facility] [address], [place], [place] [address]    Report Status 10/25/2020 FINAL  Final  Fungus Culture With Stain     Status: None (Preliminary result)   Collection Time: 10/21/20  8:58 PM   Specimen: Pleural Fluid  Result Value Ref Range Status   Fungus Stain Final report  Final    Comment: (NOTE) Performed At: [facility] [address] [place], [place] [id number] Rush Farmer MD BZ:[id number]    Fungus (Mycology) Culture PENDING  Incomplete   Fungal Source PLEURAL  Final    Comment: Performed at [facility], [place] [address], [place], [place] [address]  Fungus Culture Result     Status: None   Collection Time: 10/21/20  8:58 PM  Result Value Ref Range Status   Result 1 Comment  Final    Comment: (NOTE) KOH/Calcofluor preparation:  no fungus  observed. Performed At: [facility] [place], [place] [id number] Rush Farmer MD EU:[id number]        Radiology Studies: DG CHEST PORT 1 VIEW  Result Date: 10/30/2020 CLINICAL DATA:  Chest tube.  Pleural effusion. EXAM: PORTABLE CHEST 1 VIEW COMPARISON:  10/29/2020.  CT 10/28/2020. FINDINGS: Right chest tube in stable position. No pneumothorax. Tiny right pleural effusion and fissural fluid noted. Atelectatic changes right lung base. Heart size normal. Degenerative change thoracic spine. IMPRESSION: Right chest tube in stable position. No pneumothorax. Tiny right pleural effusion and fissural fluid again noted. Atelectatic changes right lung base again noted. Chest is unchanged from prior exam. Electronically Signed   By: Marcello Moores  Register   On: 10/30/2020 05:24   DG CHEST PORT 1 VIEW  Result Date: 10/29/2020 CLINICAL DATA:  65 year old male with right-sided chest tube. EXAM: PORTABLE CHEST 1 VIEW COMPARISON:  Chest radiograph dated 10/28/2020. FINDINGS: Similar positioning of right-sided pigtail chest tube. No interval change in the appearance of the right lung base streaky density and possible trace effusion. No large effusion. No pneumothorax. The left lung is clear. Stable cardiomediastinal silhouette. No acute osseous pathology. IMPRESSION: No interval change in the positioning of the right-sided chest tube or right lung base densities. Electronically Signed   By: Anner Crete M.D.   On: 10/29/2020 21:26   DG CHEST PORT 1 VIEW  Result Date: 10/28/2020 CLINICAL DATA:  65 year old male with pleural effusion EXAM: PORTABLE CHEST 1  VIEW COMPARISON:  Chest radiograph dated 10/27/2020. FINDINGS: Right pleural drainage catheter in similar position. Slight interval decrease in the size of the right pleural effusion. Small right pleural effusion and associated atelectasis noted. The left lung is clear. No pneumothorax. Stable cardiomediastinal silhouette. No acute osseous  pathology. IMPRESSION: Slight interval decrease in the size of the right pleural effusion. Electronically Signed   By: Anner Crete M.D.   On: 10/28/2020 20:33      Scheduled Meds:  (feeding supplement) PROSource Plus  30 mL Oral Daily   amLODipine  10 mg Oral Daily   amoxicillin-clavulanate  1 tablet Oral Q12H   enoxaparin (LOVENOX) injection  40 mg Subcutaneous Q24H   guaiFENesin  600 mg Oral BID   insulin aspart  0-5 Units Subcutaneous QHS   insulin aspart  0-9 Units Subcutaneous TID WC   insulin aspart  3 Units Subcutaneous TID WC   insulin glargine  14 Units Subcutaneous Daily   lisinopril  40 mg Oral Daily   Continuous Infusions:  sodium chloride 250 mL (10/26/20 0035)     LOS: 9 days      Time spent: 25 minutes   Dessa Phi, DO [facility] 10/30/2020, 1:00 PM   Available via Epic secure chat 7am-7pm After these hours, please refer to coverage provider listed on [email or web address]

## 2020-10-30 NOTE — Progress Notes (Signed)
10/30/2020   I have seen and evaluated the patient for R effusion.  S:  No events, chest pain resolved.  No cough/fever/chills 600cc out of chest tube.  O: Blood pressure 120/62, pulse 90, temperature 98 F (36.7 C), resp. rate 16, height 5\' 2"  (1.575 m), weight 56.7 kg, SpO2 98 %.  No distress Lungs clear R chest tube without tidaling or air leak, small serosanguinous fluid CXR and CT reviewed, minimal residual fluid  A:  R effusion question old parapneumonic, cytology neg; mild entrapment  P:  Pretty well drained, no further lytics given developing pleurisy and bloody output; will see how drains through night, if < 300cc, will clamp and likely remove tomorrow.  Augmentin to complete 10 days therapy is reasonable  Will follow  Erskine Emery MD [facility] Prefer epic messenger for cross cover needs If after hours, please call E-link

## 2020-10-31 ENCOUNTER — Inpatient Hospital Stay (HOSPITAL_COMMUNITY): Payer: Medicare HMO

## 2020-10-31 LAB — BASIC METABOLIC PANEL
Anion gap: 9 (ref 5–15)
BUN: 9 mg/dL (ref 8–23)
CO2: 28 mmol/L (ref 22–32)
Calcium: 8 mg/dL — ABNORMAL LOW (ref 8.9–10.3)
Chloride: 94 mmol/L — ABNORMAL LOW (ref 98–111)
Creatinine, Ser: 0.56 mg/dL — ABNORMAL LOW (ref 0.61–1.24)
GFR, Estimated: >60 mL/min (ref >60)
Glucose, Bld: 117 mg/dL — ABNORMAL HIGH (ref 70–99)
Potassium: 4.1 mmol/L (ref 3.5–5.1)
Sodium: 131 mmol/L — ABNORMAL LOW (ref 135–145)

## 2020-10-31 LAB — GLUCOSE, CAPILLARY
Glucose-Capillary: 121 mg/dL — ABNORMAL HIGH (ref 70–99)
Glucose-Capillary: 195 mg/dL — ABNORMAL HIGH (ref 70–99)

## 2020-10-31 MED ORDER — COVID-19 MRNA VAC-TRIS(PFIZER) 30 MCG/0.3ML IM SUSP
0.3000 mL | Freq: Once | INTRAMUSCULAR | Status: AC
  Administered 2020-10-31: 0.3 mL via INTRAMUSCULAR
  Filled 2020-10-31: qty 0.3

## 2020-10-31 NOTE — Plan of Care (Signed)
  Problem: Clinical Measurements: Goal: Will remain free from infection Outcome: Progressing Goal: Diagnostic test results will improve Outcome: Progressing   Problem: Education: Goal: Knowledge of General Education information will improve Description: Including pain rating scale, medication(s)/side effects and non-pharmacologic comfort measures Outcome: Adequate for Discharge   Problem: Clinical Measurements: Goal: Respiratory complications will improve Outcome: Adequate for Discharge

## 2020-10-31 NOTE — Plan of Care (Signed)
  Problem: Education: Goal: Knowledge of General Education information will improve Description: Including pain rating scale, medication(s)/side effects and non-pharmacologic comfort measures 10/31/2020 1454 by Lennie Hummer, RN Outcome: Adequate for Discharge 10/31/2020 1449 by Lennie Hummer, RN Outcome: Adequate for Discharge   Problem: Health Behavior/Discharge Planning: Goal: Ability to manage health-related needs will improve Outcome: Adequate for Discharge   Problem: Clinical Measurements: Goal: Ability to maintain clinical measurements within normal limits will improve Outcome: Adequate for Discharge Goal: Will remain free from infection 10/31/2020 1454 by Lennie Hummer, RN Outcome: Adequate for Discharge 10/31/2020 1449 by Lennie Hummer, RN Outcome: Progressing Goal: Diagnostic test results will improve 10/31/2020 1454 by Lennie Hummer, RN Outcome: Adequate for Discharge 10/31/2020 1449 by Lennie Hummer, RN Outcome: Progressing Goal: Respiratory complications will improve 10/31/2020 1454 by Lennie Hummer, RN Outcome: Adequate for Discharge 10/31/2020 1449 by Lennie Hummer, RN Outcome: Adequate for Discharge Goal: Cardiovascular complication will be avoided Outcome: Adequate for Discharge   Problem: Safety: Goal: Ability to remain free from injury will improve Outcome: Adequate for Discharge

## 2020-10-31 NOTE — Discharge Summary (Signed)
Physician Discharge Summary  BRYSIN TOWERY NTI:[id number] DOB: June 15, 1955 DOA: 10/21/2020  PCP: Maximiano Coss, NP  Admit date: 10/21/2020 Discharge date: 10/31/2020  Admitted From: Home Disposition:  Home  Recommendations for Outpatient Follow-up:  Follow up with PCP in 1 week Follow up with Pulmonology as scheduled on 11/24/20  Discharge Condition: Stable CODE STATUS: Full  Diet recommendation:  Diet Orders (From admission, onward)     Start     Ordered   10/28/20 1641  Diet heart healthy/carb modified Room service appropriate? Yes; Fluid consistency: Thin; Fluid restriction: 1500 mL Fluid  Diet effective now       Question Answer Comment  Diet-HS Snack? Nothing   Room service appropriate? Yes   Fluid consistency: Thin   Fluid restriction: 1500 mL Fluid      10/28/20 1641             Brief/Interim Summary: Edward [name] is a 65 year old man with past medical history significant of diabetes mellitus type 2 presented with shoulder pain and shortness of breath.  Admitted for large right pleural effusion treated with chest tube per pulmonology. Cardiothoracic surgery did not feel thoracoscopy and decortication would provide benefit. Patient continued to undergo pleural fibrinolysis and eventually chest tube removed 7/1. He also received his 4th covid pfizer vaccine before discharge (10/31/19, 12/01/19, 03/26/20, 10/31/20).   Discharge Diagnoses:  Principal Problem:   Pleural effusion Active Problems:   Type 2 diabetes mellitus (HCC)   Hyponatremia   Aortic atherosclerosis (HCC)   Chest tube in place   Large right pleural effusion status post chest tube -No benefit for surgical intervention per cardiothoracic surgery -Completed 10 days of unasyn/augmentin -Chest tube removed today    Diabetes mellitus type 2 -Continue Lantus, NovoLog  Hyponatremia -Question SIADH -Stable    Hypertension -Continue Norvasc, lisinopril  Aortic atherosclerosis -Follow-up as  outpatient  Discharge Instructions  Discharge Instructions     Call MD for:  difficulty breathing, headache or visual disturbances   Complete by: As directed    Call MD for:  extreme fatigue   Complete by: As directed    Call MD for:  persistant dizziness or light-headedness   Complete by: As directed    Call MD for:  persistant nausea and vomiting   Complete by: As directed    Call MD for:  redness, tenderness, or signs of infection (pain, swelling, redness, odor or green/yellow discharge around incision site)   Complete by: As directed    Call MD for:  severe uncontrolled pain   Complete by: As directed    Call MD for:  temperature >100.4   Complete by: As directed    Discharge instructions   Complete by: As directed    You were cared for by a hospitalist during your hospital stay. If you have any questions about your discharge medications or the care you received while you were in the hospital after you are discharged, you can call the unit and ask to speak with the hospitalist on call if the hospitalist that took care of you is not available. Once you are discharged, your primary care physician will handle any further medical issues. Please note that NO REFILLS for any discharge medications will be authorized once you are discharged, as it is imperative that you return to your primary care physician (or establish a relationship with a primary care physician if you do not have one) for your aftercare needs so that they can reassess your need for medications and  monitor your lab values.   Increase activity slowly   Complete by: As directed    No wound care   Complete by: As directed       Allergies as of 10/31/2020   No Known Allergies      Medication List     TAKE these medications    amLODipine 10 MG tablet Commonly known as: NORVASC TAKE 1 TABLET(10 MG) BY MOUTH DAILY What changed:  how much to take how to take this when to take this additional instructions    diclofenac Sodium 1 % Gel Commonly known as: VOLTAREN Apply 2 g topically 4 (four) times daily.   Dulaglutide 3 MG/0.5ML Sopn Inject 3 mg into the skin once a week.   glucose blood test strip Commonly known as: ONE TOUCH ULTRA TEST Use as instructed   hydrOXYzine 10 MG tablet Commonly known as: ATARAX/VISTARIL Take 1-2 tablets (10-20 mg total) by mouth at bedtime as needed. What changed: reasons to take this   Invokamet XR [id number] MG Tb24 Generic drug: Canagliflozin-metFORMIN HCl ER Take 2 tablets by mouth daily.   lisinopril 40 MG tablet Commonly known as: ZESTRIL Take 1 tablet (40 mg total) by mouth daily.        Follow-up Information     Martyn Ehrich, NP Follow up on 11/24/2020.   Specialty: Pulmonary Disease Why: Follow up made for 7/25/2020at 1100 Contact information: [address] [place] [address] [phone]         Maximiano Coss, NP. Schedule an appointment as soon as possible for a visit in 1 week(s).   Specialty: Adult Health Nurse Practitioner Contact information: [address] [place] [address] [phone]                No Known Allergies   Procedures/Studies: DG Chest 1 View  Result Date: 10/31/2020 CLINICAL DATA:  Pleural effusion EXAM: CHEST  1 VIEW COMPARISON:  10/30/2020 FINDINGS: Right basilar pigtail chest tube is unchanged. Small right pleural effusion or pleural thickening persists. There is stable right basilar atelectasis with rounded opacity within the right hilum related to loculated fluid within the right major fissure, better demonstrated on CT examination of 10/28/2020. Left lung is clear. No pneumothorax. Cardiac size within normal limits. Pulmonary vascularity is normal. IMPRESSION: Right basilar pigtail chest tube in place. Stable small right pleural effusion or pleural thickening, associated right basilar atelectasis, and rounded opacity at the right hilum representing loculated fluid.  No pneumothorax. Electronically Signed   By: Fidela Salisbury MD   On: 10/31/2020 06:29   DG Chest 2 View  Result Date: 10/21/2020 CLINICAL DATA:  Short of breath EXAM: CHEST - 2 VIEW COMPARISON:  CT 08/04/2018 FINDINGS: Left lung is grossly clear. Completely opacified right hemithorax. Cardiomediastinal silhouette probably within normal limits. No pneumothorax. IMPRESSION: Completely opacified right hemithorax likely due to pleural effusion and underlying airspace disease. Electronically Signed   By: Donavan Foil M.D.   On: 10/21/2020 15:42   CT CHEST W CONTRAST  Result Date: 10/28/2020 CLINICAL DATA:  Pleural effusion. EXAM: CT CHEST WITH CONTRAST TECHNIQUE: Multidetector CT imaging of the chest was performed during intravenous contrast administration. CONTRAST:  72mL OMNIPAQUE IOHEXOL 300 MG/ML  SOLN COMPARISON:  October 22, 2020. FINDINGS: Cardiovascular: No evidence of thoracic aortic aneurysm or dissection. Normal cardiac size. No pericardial effusion. Coronary artery calcifications are noted. Mediastinum/Nodes: Stable left thyroid nodule measuring 13 mm. No adenopathy is noted. The esophagus is unremarkable. Lungs/Pleura:  Left lung is clear. Stable position of right basilar pleural drainage catheter. Right pleural effusion is significantly smaller compared to prior exam. However, mild amount of residual fluid and air remains predominantly posteriorly. Mild right basilar subsegmental atelectasis is noted. Right upper lobe airspace opacity noted on prior exam appears to have resolved. Upper Abdomen: Stable enhancing abnormality seen anteriorly in left hepatic lobe most consistent with hemangioma. Musculoskeletal: No chest wall abnormality. No acute or significant osseous findings. IMPRESSION: Stable position of right basilar pleural drainage catheter. Right pleural effusion is significantly smaller compared to prior exam, although mild amount of residual fluid and air remains predominantly posteriorly. Mild  right basilar subsegmental atelectasis is noted. Right upper lobe airspace opacity noted on prior exam has resolved. Stable probable hemangioma seen in left hepatic lobe. Coronary artery calcifications are noted. Electronically Signed   By: Marijo Conception M.D.   On: 10/28/2020 11:35   CT CHEST W CONTRAST  Result Date: 10/22/2020 CLINICAL DATA:  65 year old male with pleural effusion. Concern for malignancy. EXAM: CT CHEST WITH CONTRAST TECHNIQUE: Multidetector CT imaging of the chest was performed during intravenous contrast administration. CONTRAST:  80mL OMNIPAQUE IOHEXOL 300 MG/ML  SOLN COMPARISON:  Chest radiograph dated 10/22/2020 and CT dated 10/21/2020 FINDINGS: Cardiovascular: There is no cardiomegaly or pericardial effusion. Three-vessel coronary vascular calcification. Mild atherosclerotic calcification of the aortic arch. No aneurysmal dilatation or dissection. The origins of the great vessels of the arch appear patent as visualized. No pulmonary artery embolus identified. Mediastinum/Nodes: No hilar or mediastinal adenopathy. The esophagus is grossly unremarkable. A 13 mm left thyroid hypodense nodule as seen on the prior CT. No mediastinal fluid collection. Lungs/Pleura: Interval placement of a right-sided chest tube with tip along the inferior right lower lobe pleura. There has been significant interval decrease in the size of the right pleural effusion compared to the prior CT with significant re-expansion of the right lung. Mild thickened appearance of the right pleural surface may represent in plana. Large areas of airspace opacity primarily in the right upper lobe may represent atelectasis or infiltrate. Follow-up to resolution recommended. The left lung is clear. There is no pneumothorax. The central airways are patent. Upper Abdomen: A 2 cm enhancing focus in the left lobe of the liver, likely a flash filling hemangioma or vascular shunting. Musculoskeletal: Degenerative changes of the  spine. No acute osseous pathology. IMPRESSION: 1. Interval placement of a right-sided chest tube with significant interval decrease in the size of the right pleural effusion and re-expansion of the right lung since the prior CT. 2. Large areas of airspace opacity primarily in the right upper lobe may represent atelectasis or infiltrate. Follow-up to resolution recommended. 3. Aortic Atherosclerosis (ICD10-I70.0). Electronically Signed   By: Anner Crete M.D.   On: 10/22/2020 17:51   CT CHEST W CONTRAST  Result Date: 10/21/2020 CLINICAL DATA:  Shortness of breath with right shoulder pain EXAM: CT CHEST WITH CONTRAST TECHNIQUE: Multidetector CT imaging of the chest was performed during intravenous contrast administration. CONTRAST:  21mL OMNIPAQUE IOHEXOL 300 MG/ML  SOLN COMPARISON:  Chest x-ray 10/21/2020 FINDINGS: Cardiovascular: Nonaneurysmal aorta. Normal cardiac size. No pericardial effusion. Mild coronary vascular calcification. Mediastinum/Nodes: No suspicious thyroid mass. Slight tracheal deviation to the left. No suspicious adenopathy. Esophagus within normal limits Lungs/Pleura: Large right-sided water density pleural effusion. Complete atelectasis of the right lung. Mild shift of mediastinal contents to the left. Upper Abdomen: No acute abnormality. 14 mm hyperenhancing focus within the left hepatic lobe. Musculoskeletal: No acute or  suspicious osseous abnormality IMPRESSION: 1. Large right-sided pleural effusion with complete atelectasis of the right lung and mild mediastinal shift of contents to the left. 2. 14 mm possible hyperenhancing nodule in the left hepatic lobe. When the patient is clinically stable and able to follow directions and hold their breath (preferably as an outpatient) further evaluation with dedicated abdominal MRI should be considered. Aortic Atherosclerosis (ICD10-I70.0). Electronically Signed   By: Donavan Foil M.D.   On: 10/21/2020 18:20   DG CHEST PORT 1 VIEW  Result  Date: 10/30/2020 CLINICAL DATA:  Chest tube.  Pleural effusion. EXAM: PORTABLE CHEST 1 VIEW COMPARISON:  10/29/2020.  CT 10/28/2020. FINDINGS: Right chest tube in stable position. No pneumothorax. Tiny right pleural effusion and fissural fluid noted. Atelectatic changes right lung base. Heart size normal. Degenerative change thoracic spine. IMPRESSION: Right chest tube in stable position. No pneumothorax. Tiny right pleural effusion and fissural fluid again noted. Atelectatic changes right lung base again noted. Chest is unchanged from prior exam. Electronically Signed   By: Marcello Moores  Register   On: 10/30/2020 05:24   DG CHEST PORT 1 VIEW  Result Date: 10/29/2020 CLINICAL DATA:  65 year old male with right-sided chest tube. EXAM: PORTABLE CHEST 1 VIEW COMPARISON:  Chest radiograph dated 10/28/2020. FINDINGS: Similar positioning of right-sided pigtail chest tube. No interval change in the appearance of the right lung base streaky density and possible trace effusion. No large effusion. No pneumothorax. The left lung is clear. Stable cardiomediastinal silhouette. No acute osseous pathology. IMPRESSION: No interval change in the positioning of the right-sided chest tube or right lung base densities. Electronically Signed   By: Anner Crete M.D.   On: 10/29/2020 21:26   DG CHEST PORT 1 VIEW  Result Date: 10/28/2020 CLINICAL DATA:  65 year old male with pleural effusion EXAM: PORTABLE CHEST 1 VIEW COMPARISON:  Chest radiograph dated 10/27/2020. FINDINGS: Right pleural drainage catheter in similar position. Slight interval decrease in the size of the right pleural effusion. Small right pleural effusion and associated atelectasis noted. The left lung is clear. No pneumothorax. Stable cardiomediastinal silhouette. No acute osseous pathology. IMPRESSION: Slight interval decrease in the size of the right pleural effusion. Electronically Signed   By: Anner Crete M.D.   On: 10/28/2020 20:33   DG Chest Port 1  View  Result Date: 10/27/2020 CLINICAL DATA:  Chest tube placement, chest pain EXAM: PORTABLE CHEST 1 VIEW COMPARISON:  10/26/2020 FINDINGS: Right basilar pigtail chest tube is unchanged. Mild right-sided volume loss with elevation of the right hemidiaphragm, right basilar atelectasis, and small right pleural effusion are again noted. No pneumothorax. Left lung is clear. Cardiac size within normal limits. No acute bone abnormality. IMPRESSION: Stable examination with right basilar chest tube, right-sided volume loss with right basilar atelectasis, and small right pleural effusion. Electronically Signed   By: Fidela Salisbury MD   On: 10/27/2020 05:48   DG Chest Port 1 View  Result Date: 10/26/2020 CLINICAL DATA:  Respiratory failure, right-sided chest tube in position EXAM: PORTABLE CHEST 1 VIEW COMPARISON:  10/24/2020 FINDINGS: Cardiomegaly. Interval decrease in volume of a small right pleural effusion with right-sided chest tube in position. Redemonstrated associated atelectasis or consolidation. The left lung is normally aerated. The visualized skeletal structures are unremarkable. IMPRESSION: 1. Interval decrease in volume of a small right pleural effusion with right-sided chest tube in position. Redemonstrated associated atelectasis or consolidation. 2.  Cardiomegaly. Electronically Signed   By: Eddie Candle M.D.   On: 10/26/2020 10:30   DG Chest  Port 1 View  Result Date: 10/24/2020 CLINICAL DATA:  RIGHT chest tube placement to 3 days ago EXAM: PORTABLE CHEST 1 VIEW COMPARISON:  Portable exam 1052 hours compared to 10/22/2020 FINDINGS: Pigtail RIGHT thoracostomy tube again identified. Normal heart size, mediastinal contours, and pulmonary vascularity. Persistent RIGHT pleural effusion and basilar atelectasis. Improved RIGHT lung infiltrates. LEFT lung clear. No pneumothorax. Scattered endplate spur formation thoracic spine. IMPRESSION: Persistent RIGHT pleural effusion and basilar atelectasis with  interval improvement in RIGHT lung infiltrates since 10/22/2020. Electronically Signed   By: Lavonia Dana M.D.   On: 10/24/2020 12:58   DG CHEST PORT 1 VIEW  Result Date: 10/22/2020 CLINICAL DATA:  Chest tube. EXAM: PORTABLE CHEST 1 VIEW COMPARISON:  10/21/2020. FINDINGS: Right chest tube in stable position. Interim significant improvement of right pleural effusion with mild to moderate residual. Diffuse right lung infiltrate. Mild left base subsegmental atelectasis. No pneumothorax. Heart size normal. Degenerative change thoracic spine. IMPRESSION: 1. Right chest tube in stable position. Interim significant improvement of right pleural effusion with mild to moderate residual. No pneumothorax. 2. Diffuse right lung infiltrate. Mild left base subsegmental atelectasis. Electronically Signed   By: Marcello Moores  Register   On: 10/22/2020 05:32   DG CHEST PORT 1 VIEW  Result Date: 10/21/2020 CLINICAL DATA:  Status post chest tube placement. EXAM: PORTABLE CHEST 1 VIEW COMPARISON:  Earlier today FINDINGS: There is been interval placement of a right sided chest tube. No pneumothorax visualized. Decreased volume of right pleural effusion with improved aeration to the right upper lung. Left lung appears clear. IMPRESSION: 1. Interval placement of right-sided chest tube with decrease in right pleural effusion and improved aeration to the right upper lung. 2. No pneumothorax. Electronically Signed   By: Kerby Moors M.D.   On: 10/21/2020 21:16       Discharge Exam: Vitals:   10/30/20 2007 10/31/20 0317  BP: (!) 115/56 113/72  Pulse: (!) 107 98  Resp: 18 15  Temp: 98 F (36.7 C) 98.9 F (37.2 C)  SpO2: 99% 98%    General: Pt is alert, awake, not in acute distress Cardiovascular: RRR, S1/S2 +, no edema Respiratory: Diminished on right base, no distress, on room air  Abdominal: Soft, NT, ND, bowel sounds + Extremities: no edema, no cyanosis Psych: Normal mood and affect, stable judgement and insight      The results of significant diagnostics from this hospitalization (including imaging, microbiology, ancillary and laboratory) are listed below for reference.     Microbiology: Recent Results (from the past 240 hour(s))  Blood culture (routine x 2)     Status: None   Collection Time: 10/21/20  4:26 PM   Specimen: BLOOD  Result Value Ref Range Status   Specimen Description   Final    BLOOD BLOOD RIGHT FOREARM Performed at [facility] [address] [place], [place] [address]    Special Requests   Final    BOTTLES DRAWN AEROBIC AND ANAEROBIC Blood Culture results may not be optimal due to an excessive volume of blood received in culture bottles Performed at [facility] [address] [place], [place] [address]    Culture   Final    NO GROWTH 5 DAYS Performed at [facility], [place] [address], [place],  [address]    Report Status 10/26/2020 FINAL  Final  Blood culture (routine x 2)     Status: None   Collection Time: 10/21/20  4:26 PM   Specimen: BLOOD  Result Value Ref Range Status   Specimen Description   Final    BLOOD BLOOD LEFT FOREARM Performed at [facility] [address], [place], [place] [address]    Special Requests   Final    BOTTLES DRAWN AEROBIC AND ANAEROBIC Blood Culture results may not be optimal due to an excessive volume of blood received in culture bottles Performed at [facility] [address] [place], [place] [address]    Culture   Final    NO GROWTH 5 DAYS Performed at [facility], [place] [address] [place], [place] [address]    Report Status 10/26/2020 FINAL  Final  Resp Panel by RT-PCR (Flu A&B, Covid) Nasopharyngeal Swab     Status: None   Collection Time: 10/21/20  4:27 PM   Specimen: Nasopharyngeal Swab; Nasopharyngeal(NP) swabs in vial transport medium  Result Value Ref Range Status   SARS Coronavirus 2 by RT PCR NEGATIVE NEGATIVE Final     Comment: (NOTE) SARS-CoV-2 target nucleic acids are NOT DETECTED.  The SARS-CoV-2 RNA is generally detectable in upper respiratory specimens during the acute phase of infection. The lowest concentration of SARS-CoV-2 viral copies this assay can detect is 138 copies/mL. A negative result does not preclude SARS-Cov-2 infection and should not be used as the sole basis for treatment or other patient management decisions. A negative result may occur with  improper specimen collection/handling, submission of specimen other than nasopharyngeal swab, presence of viral mutation(s) within the areas targeted by this assay, and inadequate number of viral copies(<138 copies/mL). A negative result must be combined with clinical observations, patient history, and epidemiological information. The expected result is Negative.  Fact Sheet for Patients:  EntrepreneurPulse.com.au  Fact Sheet for Healthcare Providers:  IncredibleEmployment.be  This test is no t yet approved or cleared by the [place] FDA and  has been authorized for detection and/or diagnosis of SARS-CoV-2 by FDA under an Emergency Use Authorization (EUA). This EUA will remain  in effect (meaning this test can be used) for the duration of the COVID-19 declaration under Section 564(b)(1) of the Act, 21 U.S.C.section 360bbb-3(b)(1), unless the authorization is terminated  or revoked sooner.       Influenza A by PCR NEGATIVE NEGATIVE Final   Influenza B by PCR NEGATIVE NEGATIVE Final    Comment: (NOTE) The Xpert Xpress SARS-CoV-2/FLU/RSV plus assay is intended as an aid in the diagnosis of influenza from Nasopharyngeal swab specimens and should not be used as a sole basis for treatment. Nasal washings and aspirates are unacceptable for Xpert Xpress SARS-CoV-2/FLU/RSV testing.  Fact Sheet for Patients: EntrepreneurPulse.com.au  Fact Sheet for Healthcare  Providers: IncredibleEmployment.be  This test is not yet approved or cleared by the [place] FDA and has been authorized for detection and/or diagnosis of SARS-CoV-2 by FDA under an Emergency Use Authorization (EUA). This EUA will remain in effect (meaning this test can be used) for the duration of the COVID-19 declaration under Section 564(b)(1) of the Act, 21 U.S.C. section 360bbb-3(b)(1), unless the authorization is terminated or revoked.  Performed at [facility], [place] [address], [place], [place] [address]   Body fluid culture w Gram Stain     Status: None   Collection Time: 10/21/20  8:58 PM   Specimen: Pleural Fluid  Result Value Ref Range Status   Specimen Description   Final    PLEURAL FLUID Performed at [facility], [place] [address], [place], [place] [address]  Special Requests   Final    NONE Performed at [facility], [place] [address], [place], [place] [address]    Gram Stain   Final    FEW WBC PRESENT, PREDOMINANTLY MONONUCLEAR NO ORGANISMS SEEN    Culture   Final    NO GROWTH 3 DAYS Performed at [facility] [address], [place], [place] [address]    Report Status 10/25/2020 FINAL  Final  Fungus Culture With Stain     Status: None (Preliminary result)   Collection Time: 10/21/20  8:58 PM   Specimen: Pleural Fluid  Result Value Ref Range Status   Fungus Stain Final report  Final    Comment: (NOTE) Performed At: [facility] [address] [place], [place] [id number] Rush Farmer MD [id number]    Fungus (Mycology) Culture PENDING  Incomplete   Fungal Source PLEURAL  Final    Comment: Performed at [facility], [place] [address], [place], [place] [address]  Fungus Culture Result     Status: None   Collection Time: 10/21/20  8:58 PM  Result Value Ref Range Status   Result 1 Comment  Final    Comment: (NOTE) KOH/Calcofluor preparation:  no fungus  observed. Performed At: [facility] [place], [place] [id number] Rush Farmer MD IR:[id number]      Labs: BNP (last 3 results) Recent Labs    10/21/20 2124  BNP 51.0   Basic Metabolic Panel: Recent Labs  Lab 10/28/20 0413 10/28/20 1515 10/29/20 0440 10/30/20 0435 10/31/20 0424  NA  --  128* 134* 131* 131*  K  --  4.2 4.3 4.2 4.1  CL  --  96* 98 94* 94*  CO2  --  25 29 28 28   GLUCOSE  --  190* 111* 128* 117*  BUN  --  13 9 11 9   CREATININE 0.67 0.70 0.48* 0.57* 0.56*  CALCIUM  --  7.8* 8.2* 8.3* 8.0*   Liver Function Tests: No results for input(s): AST, ALT, ALKPHOS, BILITOT, PROT, ALBUMIN in the last 168 hours. No results for input(s): LIPASE, AMYLASE in the last 168 hours. No results for input(s): AMMONIA in the last 168 hours. CBC: Recent Labs  Lab 10/28/20 1515 10/30/20 0435  WBC 9.9 9.0  HGB 13.0 11.9*  HCT 38.7* 36.2*  MCV 86.6 88.7  PLT 560* 571*   Cardiac Enzymes: No results for input(s): CKTOTAL, CKMB, CKMBINDEX, TROPONINI in the last 168 hours. BNP: Invalid input(s): POCBNP CBG: Recent Labs  Lab 10/30/20 0737 10/30/20 1206 10/30/20 1646 10/30/20 2134 10/31/20 0722  GLUCAP 128* 248* 107* 195* 121*   D-Dimer No results for input(s): DDIMER in the last 72 hours. Hgb A1c No results for input(s): HGBA1C in the last 72 hours. Lipid Profile No results for input(s): CHOL, HDL, LDLCALC, TRIG, CHOLHDL, LDLDIRECT in the last 72 hours. Thyroid function studies No results for input(s): TSH, T4TOTAL, T3FREE, THYROIDAB in the last 72 hours.  Invalid input(s): FREET3 Anemia work up No results for input(s): VITAMINB12, FOLATE, FERRITIN, TIBC, IRON, RETICCTPCT in the last 72 hours. Urinalysis No results found for: COLORURINE, APPEARANCEUR, [place], [place], [place], [place], [place], [place], PROTEINUR, UROBILINOGEN, NITRITE, LEUKOCYTESUR Sepsis Labs Invalid input(s): PROCALCITONIN,  WBC,   LACTICIDVEN Microbiology Recent Results (from the past 240 hour(s))  Blood culture (routine x 2)     Status: None   Collection Time: 10/21/20  4:26 PM   Specimen: BLOOD  Result Value Ref Range Status   Specimen Description  Final    BLOOD BLOOD RIGHT FOREARM Performed at [facility] [address] [place], [place] [address]    Special Requests   Final    BOTTLES DRAWN AEROBIC AND ANAEROBIC Blood Culture results may not be optimal due to an excessive volume of blood received in culture bottles Performed at [facility] [address], [place], [place] [address]    Culture   Final    NO GROWTH 5 DAYS Performed at [facility], [place] [address] [place], [place] [address]    Report Status 10/26/2020 FINAL  Final  Blood culture (routine x 2)     Status: None   Collection Time: 10/21/20  4:26 PM   Specimen: BLOOD  Result Value Ref Range Status   Specimen Description   Final    BLOOD BLOOD LEFT FOREARM Performed at [facility] [address], [place], [place] [address]    Special Requests   Final    BOTTLES DRAWN AEROBIC AND ANAEROBIC Blood Culture results may not be optimal due to an excessive volume of blood received in culture bottles Performed at [facility] [address], [place], [place] [address]    Culture   Final    NO GROWTH 5 DAYS Performed at [facility], [place] [address], [place],  [address]    Report Status 10/26/2020 FINAL  Final  Resp Panel by RT-PCR (Flu A&B, Covid) Nasopharyngeal Swab     Status: None   Collection Time: 10/21/20  4:27 PM   Specimen: Nasopharyngeal Swab; Nasopharyngeal(NP) swabs in vial transport medium  Result Value Ref Range Status   SARS Coronavirus 2 by RT PCR NEGATIVE NEGATIVE Final    Comment: (NOTE) SARS-CoV-2 target nucleic acids are NOT DETECTED.  The SARS-CoV-2 RNA is generally detectable in upper respiratory specimens during the  acute phase of infection. The lowest concentration of SARS-CoV-2 viral copies this assay can detect is 138 copies/mL. A negative result does not preclude SARS-Cov-2 infection and should not be used as the sole basis for treatment or other patient management decisions. A negative result may occur with  improper specimen collection/handling, submission of specimen other than nasopharyngeal swab, presence of viral mutation(s) within the areas targeted by this assay, and inadequate number of viral copies(<138 copies/mL). A negative result must be combined with clinical observations, patient history, and epidemiological information. The expected result is Negative.  Fact Sheet for Patients:  EntrepreneurPulse.com.au  Fact Sheet for Healthcare Providers:  IncredibleEmployment.be  This test is no t yet approved or cleared by the [place] FDA and  has been authorized for detection and/or diagnosis of SARS-CoV-2 by FDA under an Emergency Use Authorization (EUA). This EUA will remain  in effect (meaning this test can be used) for the duration of the COVID-19 declaration under Section 564(b)(1) of the Act, 21 U.S.C.section 360bbb-3(b)(1), unless the authorization is terminated  or revoked sooner.       Influenza A by PCR NEGATIVE NEGATIVE Final   Influenza B by PCR NEGATIVE NEGATIVE Final    Comment: (NOTE) The Xpert Xpress SARS-CoV-2/FLU/RSV plus assay is intended as an aid in the diagnosis of influenza from Nasopharyngeal swab specimens and should not be used as a sole basis for treatment. Nasal washings and aspirates are unacceptable for Xpert Xpress SARS-CoV-2/FLU/RSV testing.  Fact Sheet for Patients: EntrepreneurPulse.com.au  Fact Sheet for Healthcare Providers: IncredibleEmployment.be  This test is not yet approved or cleared by the [place]  FDA and has been authorized for detection and/or  diagnosis of SARS-CoV-2 by FDA under an Emergency Use Authorization (EUA). This EUA will remain in effect (meaning this test can be used) for the duration of the COVID-19 declaration under Section 564(b)(1) of the Act, 21 U.S.C. section 360bbb-3(b)(1), unless the authorization is terminated or revoked.  Performed at [facility], [place] [address] [place], [place] [address]   Body fluid culture w Gram Stain     Status: None   Collection Time: 10/21/20  8:58 PM   Specimen: Pleural Fluid  Result Value Ref Range Status   Specimen Description   Final    PLEURAL FLUID Performed at [facility], [place] [address], [place], [place] [address]    Special Requests   Final    NONE Performed at [facility], [place] [address], [place], [place] [address]    Gram Stain   Final    FEW WBC PRESENT, PREDOMINANTLY MONONUCLEAR NO ORGANISMS SEEN    Culture   Final    NO GROWTH 3 DAYS Performed at [facility] [address] [place], [place] [address]    Report Status 10/25/2020 FINAL  Final  Fungus Culture With Stain     Status: None (Preliminary result)   Collection Time: 10/21/20  8:58 PM   Specimen: Pleural Fluid  Result Value Ref Range Status   Fungus Stain Final report  Final    Comment: (NOTE) Performed At: [facility] [address], [place] [id number] Rush Farmer MD [id number]    Fungus (Mycology) Culture PENDING  Incomplete   Fungal Source PLEURAL  Final    Comment: Performed at [facility], [place] [address], [place],  [address]  Fungus Culture Result     Status: None   Collection Time: 10/21/20  8:58 PM  Result Value Ref Range Status   Result 1 Comment  Final    Comment: (NOTE) KOH/Calcofluor preparation:  no fungus observed. Performed At: [facility] [place], [place] [id number] Rush Farmer MD [id number]      Patient was seen and examined on the day  of discharge and was found to be in stable condition. Time coordinating discharge: 25 minutes including assessment and coordination of care, as well as examination of the patient.   SIGNED:  Dessa Phi, DO [facility] 10/31/2020, 10:03 AM

## 2020-10-31 NOTE — Care Management Important Message (Signed)
Important Message  Patient Details IM Letter placed in Patient's room. Name: Edward [name] MRN: [id number] Date of Birth: [birth year]   Medicare Important Message Given:  Yes     Kerin Salen 10/31/2020, 12:31 PM

## 2020-10-31 NOTE — Progress Notes (Signed)
PCCM Progress Note  Chest tube output minimal and chest xray reviewed with attending and remains stable with good response seen. Decision made to remove chest tube. Tube was removed at bedside with no acute issues.   Patient is cleared for discharge from pulmonary standpoint. He has a follow up apt with our clinic 7/25/20200 at 1100. No further antibiotics needed.   Daughter aware of plan  Edward [name] D. Kenton Kingfisher, NP-C [facility] Personal contact information can be found on Amion  10/31/2020, 9:21 AM

## 2020-11-07 ENCOUNTER — Encounter: Payer: Self-pay | Admitting: Registered Nurse

## 2020-11-07 ENCOUNTER — Ambulatory Visit (INDEPENDENT_AMBULATORY_CARE_PROVIDER_SITE_OTHER): Payer: Medicare HMO | Admitting: Registered Nurse

## 2020-11-07 ENCOUNTER — Other Ambulatory Visit: Payer: Self-pay

## 2020-11-07 VITALS — BP 122/64 | HR 105 | Temp 98.4°F | Resp 18 | Ht 62.0 in | Wt 122.6 lb

## 2020-11-07 DIAGNOSIS — I10 Essential (primary) hypertension: Secondary | ICD-10-CM | POA: Diagnosis not present

## 2020-11-07 DIAGNOSIS — E1140 Type 2 diabetes mellitus with diabetic neuropathy, unspecified: Secondary | ICD-10-CM

## 2020-11-07 DIAGNOSIS — Z8709 Personal history of other diseases of the respiratory system: Secondary | ICD-10-CM | POA: Diagnosis not present

## 2020-11-07 DIAGNOSIS — Z09 Encounter for follow-up examination after completed treatment for conditions other than malignant neoplasm: Secondary | ICD-10-CM

## 2020-11-07 DIAGNOSIS — K769 Liver disease, unspecified: Secondary | ICD-10-CM | POA: Diagnosis not present

## 2020-11-07 DIAGNOSIS — R351 Nocturia: Secondary | ICD-10-CM | POA: Diagnosis not present

## 2020-11-07 NOTE — Progress Notes (Signed)
Established Patient Office Visit  Subjective:  Patient ID: Edward [name], male    DOB: [birth year]  Age: 65 y.o. MRN: [id number]  CC:  Chief Complaint  Patient presents with   Hospitalization Follow-up    Patient states he is here for a hospital follow up. Patient would like to discuss medication refills and dosages.    HPI Edward [name] presents for HFU  In brief: seen at an outside urgent care for a fall on 10/21/20 where plain films showed large pleural effusion. Was sent to ER at [facility] and admitted. Imaging confirmed pleural effusion, IR was able to do bedside thoracentesis. Pt kept 10 days for monitoring and drainage. DC on 10/31/20. Now has been set up with pulmonology follow up on 11/24/20.   Since discharge feels stable. States breathing today is baseline, vitals appear stable. No ongoing fever, chills, fatigue. Does note some positional dizziness - gets lightheaded when rising quickly.   Notes that after discharge - has had more frequent nocturia - 3x per night. No pain or hematuria. Notes some post void dribbling. No PSA on file. Notes has had some nocturia in past but less frequent  Since discharge, snoring more. Waking with headache and very dry mouth. Witnessed apnea. Has not had sleep study previously.   Notes rash on glans of penis and some discharge from penis. Some mild dysuria. No new sexual contacts. Is on invokamet and has seen sugars rise with recent hospitalization. No other rash or pain, just urinary symptoms as above.  Incidental finding in hospital of liver lesion, CT w contrast suggests likely hemangioma. Will follow up with outpatient MRI.  Of note - A1c has jumped - hyperglycemic throughout admission and had A1c at 10.8 - has continued medications with good compliance, no symptoms of hyperglycemia, reports sugars 100-200 since discharge  Past Medical History:  Diagnosis Date   Aortic atherosclerosis ([place]) 10/25/2020   Diabetes mellitus without complication  ([place])    Hypertension     Past Surgical History:  Procedure Laterality Date   TUMOR REMOVAL      Family History  Problem Relation Age of Onset   Prostate cancer Father    Heart attack Paternal Grandfather     Social History   Socioeconomic History   Marital status: Married    Spouse name: Not on file   Number of children: 2   Years of education: 16   Highest education level: Not on file  Occupational History   Occupation: Loss adjuster, chartered  Tobacco Use   Smoking status: Never   Smokeless tobacco: Never  Vaping Use   Vaping Use: Never used  Substance and Sexual Activity   Alcohol use: No    Alcohol/week: 0.0 standard drinks   Drug use: No   Sexual activity: Not on file  Other Topics Concern   Not on file  Social History Narrative   Fun: Computers and playing games   Social Determinants of Health   Financial Resource Strain: Not on file  Food Insecurity: Not on file  Transportation Needs: Not on file  Physical Activity: Not on file  Stress: Not on file  Social Connections: Not on file  Intimate Partner Violence: Not on file    Outpatient Medications Prior to Visit  Medication Sig Dispense Refill   amLODipine (NORVASC) 10 MG tablet TAKE 1 TABLET(10 MG) BY MOUTH DAILY 90 tablet 3   Canagliflozin-metFORMIN HCl ER (INVOKAMET XR) [phone] MG TB24 Take 2 tablets by mouth daily. 180 tablet 1  diclofenac Sodium (VOLTAREN) 1 % GEL Apply 2 g topically 4 (four) times daily. 100 g 3   Dulaglutide 3 MG/0.5ML SOPN Inject 3 mg into the skin once a week. 6 mL 0   glucose blood (ONE TOUCH ULTRA TEST) test strip Use as instructed 100 each 12   hydrOXYzine (ATARAX/VISTARIL) 10 MG tablet Take 1-2 tablets (10-20 mg total) by mouth at bedtime as needed. 30 tablet 3   lisinopril (ZESTRIL) 40 MG tablet Take 1 tablet (40 mg total) by mouth daily. 90 tablet 3   No facility-administered medications prior to visit.    No Known Allergies  ROS Review of Systems  Constitutional: Negative.    HENT: Negative.    Eyes: Negative.   Respiratory: Negative.    Cardiovascular: Negative.   Gastrointestinal: Negative.   Genitourinary: Negative.   Musculoskeletal: Negative.   Skin: Negative.   Neurological: Negative.   Psychiatric/Behavioral: Negative.    All other systems reviewed and are negative.    Objective:    Physical Exam Constitutional:      General: He is not in acute distress.    Appearance: Normal appearance. He is normal weight. He is not ill-appearing, toxic-appearing or diaphoretic.  Cardiovascular:     Rate and Rhythm: Normal rate and regular rhythm.     Heart sounds: Normal heart sounds. No murmur heard.   No friction rub. No gallop.  Pulmonary:     Effort: Pulmonary effort is normal. No respiratory distress.     Breath sounds: Normal breath sounds. No stridor. No wheezing, rhonchi or rales.  Chest:     Chest wall: No tenderness.  Neurological:     General: No focal deficit present.     Mental Status: He is alert and oriented to person, place, and time. Mental status is at baseline.  Psychiatric:        Mood and Affect: Mood normal.        Behavior: Behavior normal.        Thought Content: Thought content normal.        Judgment: Judgment normal.    BP 122/64   Pulse (!) 105   Temp 98.4 F (36.9 C) (Temporal)   Resp 18   Ht 5\' 2"  (1.575 m)   Wt 122 lb 9.6 oz (55.6 kg)   SpO2 99%   BMI 22.42 kg/m  Wt Readings from Last 3 Encounters:  11/07/20 122 lb 9.6 oz (55.6 kg)  10/31/20 137 lb 5.6 oz (62.3 kg)  07/11/20 140 lb (63.5 kg)     There are no preventive care reminders to display for this patient.  There are no preventive care reminders to display for this patient.  Lab Results  Component Value Date   TSH 0.441 10/21/2020   Lab Results  Component Value Date   WBC 9.0 10/30/2020   HGB 11.9 (L) 10/30/2020   HCT 36.2 (L) 10/30/2020   MCV 88.7 10/30/2020   PLT 571 (H) 10/30/2020   Lab Results  Component Value Date   NA 131 (L)  10/31/2020   K 4.1 10/31/2020   CO2 28 10/31/2020   GLUCOSE 117 (H) 10/31/2020   BUN 9 10/31/2020   CREATININE 0.56 (L) 10/31/2020   BILITOT 0.8 10/22/2020   ALKPHOS 54 10/22/2020   AST 24 10/22/2020   ALT 22 10/22/2020   PROT 6.4 (L) 10/22/2020   ALBUMIN 2.8 (L) 10/22/2020   CALCIUM 8.0 (L) 10/31/2020   ANIONGAP 9 10/31/2020   GFR 144.70 02/10/2018  Lab Results  Component Value Date   CHOL 118 02/08/2020   Lab Results  Component Value Date   HDL 44 02/08/2020   Lab Results  Component Value Date   LDLCALC 48 02/08/2020   Lab Results  Component Value Date   TRIG 155 (H) 02/08/2020   Lab Results  Component Value Date   CHOLHDL 2.7 02/08/2020   Lab Results  Component Value Date   HGBA1C 10.5 (H) 10/21/2020      Assessment & Plan:   Problem List Items Addressed This Visit       Cardiovascular and Mediastinum   Essential hypertension   Relevant Orders   CBC with Differential/Platelet (Completed)   TSH (Completed)   Lipid panel (Completed)     Endocrine   Type 2 diabetes mellitus ([place])   Relevant Orders   CBC with Differential/Platelet (Completed)   Hemoglobin A1c (Completed)   Lipid panel (Completed)   Comprehensive metabolic panel (Completed)   Other Visit Diagnoses     Liver lesion    -  Primary   Relevant Orders   CBC with Differential/Platelet (Completed)   Hepatitis Panel (REFL) (Completed)   Nocturia       Relevant Orders   CBC with Differential/Platelet (Completed)   PSA (Completed)   Hospital discharge follow-up       Relevant Orders   CBC with Differential/Platelet (Completed)   Lipid panel (Completed)   History of pleural effusion       Relevant Orders   CBC with Differential/Platelet (Completed)       No orders of the defined types were placed in this encounter.   Follow-up: No follow-ups on file.   PLAN Pt seems to be steadily improving since discharge. Lungs sound clear. HR borderline elevated today but his baseline  appears to always have been higher end of normal. Other vital stable. Will draw labs Liver lesion coincidentally noted on imaging in hospital. Will order mrcp to further characterize. No known hx of hepatitis, plan to test today. Last liver function studies appeared stable.  Ongoing urinary symptoms - will collect PSA. If normal, will watch and wait. Can consider flomax and urology referral if symptoms persistent.  Recheck lipids and Q2V today - near certain that illness drove up A1c, expecting that this will fall a fair bit today and return to moderate control in coming weeks Keep follow up with pulmonology as scheduled Plan follow up based on labs Patient encouraged to call clinic with any questions, comments, or concerns.  Maximiano Coss, NP

## 2020-11-07 NOTE — Patient Instructions (Addendum)
Mr. Swiss -   Doristine Devoid to see you. Glad things are improving  In short - your heart and lungs sound ok today. If your heart rate rises above 145, please call me or seek in person evaluation.   The rash and discharge on the penis can be treated with diflucan. This is likely related to recent rise in sugars. If the rash or discharge comes back, I'll send an extra diflucan to take if you need.  We will check on labs for the prostate today. I'll call with any concerning results.  I will send a referral for sleep apnea testing. This usually takes a while so I don't think we will have to worry about that until late August or September. It's important to find out if you have this, because if left untreated, it can increase your risk for heart attack or stroke.  I will send an order for an MRI of the liver to check on the spot that was noticed in the hospital. It appears likely benign but better to know for certain.  Ok to only use sleep medicine when you need it  Continue snacking on fruits and vegetables.  See you in around 6 weeks, please return sooner if any new concerns arise.  Thank you  Edward [name]      Anh Bi -  R?t vui ???c g?p b?n. R?t vui v m?i th? ?ang ???c c?i thi?n  Ni tm l?i - hm nay tim v ph?i c?a b?n ho?t ??ng t?t. N?u nh?p tim c?a b?n t?ng trn 145, vui lng g?i cho ti ho?c ??n g?p tr?c ti?p ?nh gi.  Pht ban v ti?t d?ch trn d??ng v?t c th? ???c ?i?u tr? b?ng diflucan. ?i?u ny c th? lin quan ??n s? gia t?ng ???ng g?n ?y. N?u pht ban ho?c ti?t d?ch ti pht, ti s? g?i thm diflucan ?? u?ng n?u b?n c?n.  Hm nay chng ti s? ki?m tra cc phng th nghi?m v? tuy?n ti?n li?t. Ti s? g?i v?i b?t k? k?t qu? lin quan.  Ti s? g?i gi?y gi?i thi?u ?? ki?m tra ch?ng ng?ng th? khi ng?Marland Kitchen Vi?c ny th??ng m?t m?t kho?ng th?i gian nn ti khng ngh? r?ng chng ta s? ph?i lo l?ng v? ?i?u ? cho ??n cu?i thng 8 ho?c thng 9. ?i?u quan tr?ng l ph?i tm hi?u xem b?n c m?c ch?ng  ny hay khng, v n?u khng ???c ?i?u tr?, n c th? lm t?ng nguy c? ?au tim ho?c ??t qu?.  Ti s? g?i yu c?u ch?p MRI gan ?? ki?m tra t?i ch? ? nh?n th?y trong b?nh vi?n. N c v? lnh tnh nh?ng t?t h?n l nn bi?t ch?c ch?n.  ???c, ch? s? d?ng thu?c ng? khi b?n c?n  Ti?p t?c ?n v?t v?i tri cy v rau qu?Marland Kitchen  H?n g?p l?i b?n sau kho?ng 6 tu?n n?a, vui lng quay l?i s?m h?n n?u c b?t k? m?i quan tm m?i no pht sinh.  C?m ?n b?n  Giu c

## 2020-11-08 LAB — CBC WITH DIFFERENTIAL/PLATELET
Absolute Monocytes: 821 cells/uL (ref 200–950)
Basophils Absolute: 61 cells/uL (ref 0–200)
Basophils Relative: 0.8 %
Eosinophils Absolute: 84 cells/uL (ref 15–500)
Eosinophils Relative: 1.1 %
HCT: 37.9 % — ABNORMAL LOW (ref 38.5–50.0)
Hemoglobin: 12.1 g/dL — ABNORMAL LOW (ref 13.2–17.1)
Lymphs Abs: 1680 cells/uL (ref 850–3900)
MCH: 28.5 pg (ref 27.0–33.0)
MCHC: 31.9 g/dL — ABNORMAL LOW (ref 32.0–36.0)
MCV: 89.2 fL (ref 80.0–100.0)
MPV: 8.4 fL (ref 7.5–12.5)
Monocytes Relative: 10.8 %
Neutro Abs: 4955 cells/uL (ref 1500–7800)
Neutrophils Relative %: 65.2 %
Platelets: 645 10*3/uL — ABNORMAL HIGH (ref 140–400)
RBC: 4.25 10*6/uL (ref 4.20–5.80)
RDW: 11.5 % (ref 11.0–15.0)
Total Lymphocyte: 22.1 %
WBC: 7.6 10*3/uL (ref 3.8–10.8)

## 2020-11-08 LAB — COMPREHENSIVE METABOLIC PANEL
AG Ratio: 1 (calc) (ref 1.0–2.5)
ALT: 22 U/L (ref 9–46)
AST: 16 U/L (ref 10–35)
Albumin: 3.6 g/dL (ref 3.6–5.1)
Alkaline phosphatase (APISO): 66 U/L (ref 35–144)
BUN/Creatinine Ratio: 28 (calc) — ABNORMAL HIGH (ref 6–22)
BUN: 16 mg/dL (ref 7–25)
CO2: 29 mmol/L (ref 20–32)
Calcium: 9.3 mg/dL (ref 8.6–10.3)
Chloride: 93 mmol/L — ABNORMAL LOW (ref 98–110)
Creat: 0.58 mg/dL — ABNORMAL LOW (ref 0.70–1.25)
Globulin: 3.7 g/dL (calc) (ref 1.9–3.7)
Glucose, Bld: 107 mg/dL — ABNORMAL HIGH (ref 65–99)
Potassium: 4.8 mmol/L (ref 3.5–5.3)
Sodium: 132 mmol/L — ABNORMAL LOW (ref 135–146)
Total Bilirubin: 0.4 mg/dL (ref 0.2–1.2)
Total Protein: 7.3 g/dL (ref 6.1–8.1)

## 2020-11-08 LAB — LIPID PANEL
Cholesterol: 123 mg/dL (ref <200)
HDL: 46 mg/dL (ref >=40)
LDL Cholesterol (Calc): 56 mg/dL (calc)
Non-HDL Cholesterol (Calc): 77 mg/dL (calc) (ref <130)
Total CHOL/HDL Ratio: 2.7 (calc) (ref <5.0)
Triglycerides: 119 mg/dL (ref <150)

## 2020-11-08 LAB — HEMOGLOBIN A1C
Hgb A1c MFr Bld: 9.6 % of total Hgb — ABNORMAL HIGH (ref <5.7)
Mean Plasma Glucose: 229 mg/dL
eAG (mmol/L): 12.7 mmol/L

## 2020-11-08 LAB — PSA: PSA: 1.39 ng/mL (ref <=4.00)

## 2020-11-08 LAB — TSH: TSH: 0.77 mIU/L (ref 0.40–4.50)

## 2020-11-11 LAB — HEPATITIS PANEL(REFL)
HEPATITIS C ANTIBODY REFILL$(REFL): NONREACTIVE
Hep B Core Total Ab: REACTIVE — AB
Hep B S Ab: REACTIVE — AB
Hepatitis A AB,Total: REACTIVE — AB
Hepatitis B Surface Ag: NONREACTIVE
SIGNAL TO CUT-OFF: 0.04 (ref <1.00)

## 2020-11-11 LAB — REFLEX TIQ

## 2020-11-13 ENCOUNTER — Ambulatory Visit (HOSPITAL_COMMUNITY)
Admission: RE | Admit: 2020-11-13 | Discharge: 2020-11-13 | Disposition: A | Discharge status: Home / Self Care | Payer: Medicare HMO | Source: Ambulatory Visit | Attending: Registered Nurse | Admitting: Registered Nurse

## 2020-11-13 ENCOUNTER — Other Ambulatory Visit: Payer: Self-pay

## 2020-11-13 ENCOUNTER — Other Ambulatory Visit (HOSPITAL_COMMUNITY): Payer: Self-pay | Admitting: Registered Nurse

## 2020-11-13 ENCOUNTER — Other Ambulatory Visit: Payer: Self-pay | Admitting: Registered Nurse

## 2020-11-13 DIAGNOSIS — K769 Liver disease, unspecified: Secondary | ICD-10-CM | POA: Diagnosis not present

## 2020-11-13 DIAGNOSIS — N281 Cyst of kidney, acquired: Secondary | ICD-10-CM | POA: Diagnosis not present

## 2020-11-13 DIAGNOSIS — K760 Fatty (change of) liver, not elsewhere classified: Secondary | ICD-10-CM | POA: Diagnosis not present

## 2020-11-13 DIAGNOSIS — D1803 Hemangioma of intra-abdominal structures: Secondary | ICD-10-CM | POA: Diagnosis not present

## 2020-11-13 DIAGNOSIS — R932 Abnormal findings on diagnostic imaging of liver and biliary tract: Secondary | ICD-10-CM | POA: Diagnosis not present

## 2020-11-13 DIAGNOSIS — J929 Pleural plaque without asbestos: Secondary | ICD-10-CM | POA: Diagnosis not present

## 2020-11-13 MED ORDER — GADOBUTROL 1 MMOL/ML IV SOLN
5.5000 mL | Freq: Once | INTRAVENOUS | Status: AC | PRN
  Administered 2020-11-13: 5.5 mL via INTRAVENOUS

## 2020-11-20 LAB — FUNGAL ORGANISM REFLEX

## 2020-11-20 LAB — FUNGUS CULTURE WITH STAIN

## 2020-11-20 LAB — FUNGUS CULTURE RESULT

## 2020-11-21 ENCOUNTER — Ambulatory Visit: Payer: Medicare HMO

## 2020-11-21 ENCOUNTER — Other Ambulatory Visit: Payer: Self-pay

## 2020-11-21 DIAGNOSIS — J90 Pleural effusion, not elsewhere classified: Secondary | ICD-10-CM

## 2020-11-24 ENCOUNTER — Other Ambulatory Visit: Payer: Self-pay

## 2020-11-24 ENCOUNTER — Encounter: Payer: Self-pay | Admitting: Primary Care

## 2020-11-24 ENCOUNTER — Ambulatory Visit: Payer: Medicare HMO | Admitting: Primary Care

## 2020-11-24 ENCOUNTER — Ambulatory Visit (INDEPENDENT_AMBULATORY_CARE_PROVIDER_SITE_OTHER): Payer: Medicare HMO

## 2020-11-24 DIAGNOSIS — J90 Pleural effusion, not elsewhere classified: Secondary | ICD-10-CM | POA: Diagnosis not present

## 2020-11-24 NOTE — Patient Instructions (Addendum)
  CXR today showed persistent small right pleural effusion versus chronic pleural thickening. This is re-assuring, pleural effusion felt to be from pneumonia. Monitor for fever, shortness of breath, cough or worsening right sided pain- notify our office   Recommendations: Call PCP regarding ongoing dizziness and low BP   Orders: CXR in 4 weeks  Follow-up: 4 weeks with either Dr. Lamonte Sakai or Ander Slade

## 2020-11-24 NOTE — Assessment & Plan Note (Addendum)
Admitted 10/21/20 with parapneumonic effusion. S/p pigtail chest tube with 2L removed. Fluid consistent with exudative effusion. Cytology negative. CXR on 6/28 showed residual small hydropneumothorax. Received 5 days of IV Unasyn and completed 10 day course of Augmentin.   - Clinically doing well today. Denies fever, significant cough or shortness of breath. Right sided chest/shoulder pain has improved significantly  - CXR 11/24/2020 showed persistent small right pleural effusion vs chronic pleural thickening. No pneumothorax.  - No medication changes today - FU in 4 weeks with repeat CXR and OV with Dr. Lamonte Sakai / if effusion still present would consider getting CT chest at that point

## 2020-11-24 NOTE — Progress Notes (Signed)
$'@Patient'M$  ID: Edward [name], male    DOB: 03/22/1956, 66 y.o.   MRN: [id number]  Chief Complaint  Patient presents with   Hospitalization Follow-up    Referring provider: No ref. provider found  HPI: 65 year old male, never smoked.  Past medical history significant for hypertension, aortic arthrosclerosis, pleural effusion, type 2 diabetes, vitamin B-12 deficiency.  Consulted inpatient by PCCM team on 10/21/2020 for large right pleural effusion.  Hospital course Patient presented to the emergency room with complaints of shortness of breath, chest pain and shoulder pain.  Patient had previously suffered a minor fall and had developed right shoulder pain.  He was referred to orthopedics for consult.  Shoulder x-ray outpatient revealed significant pleural effusion.  On arrival to ED patient's temperature was 101.5, he was tachycardic and respiratory rate was 22.  No oxygen desaturations.  Chest x-ray confirmed very large right-sided pleural effusion with complete atelectasis of right lung with mild mediastinal shift, additionally there is a 14 mm possible hyperenhancing nodule of left hepatic lobe.  PCCM consulted on 6/21, pigtail chest tube placed with approximately 2 L removed post insertion.  Fluid was consistent with exudative effusion.  Repeat CT chest on 6/28 showed improvement in right pleural effusion and mild amount of residual fluid and air.  Residual small hydropneumothorax. TCTS consulted as well, no benefit for surgical intervention.  Patient was treated with IV Zosyn, Augmentin initiated on 6/26 for 7 to 10 days. Cytology negative.   11/24/2020- interim hx  Patient presents today for hospital follow-up. Accompanied by interpreter. He is doing well, still feels some weak. He has a rare intermittent dry cough. Right chest/should pain is still present but states that it is not as much as before, rating 2-3/10. No hemoptysis. He finished Augmentin course while in the hospital. He gets dizzy when  he changes position, this was mentioned to his PCP during most recent visit in July. Normal blood pressure is 120/70. He is on Norvasc '10mg'$  dail and Lisinopril '40mg'$  daily. MRCP on 11/15/20 showed left hepatic lobe lesion corresponds to benign hemangioma, hepatic steatosis. Diffuse pleural thickening and enhancement overlying the right lung. A trace amount of loculated pleural fluid. Denies fever, shortness of breath.    Imaging: 11/24/20 CXR- Persistent small right pleural effusion versus chronic pleural thickening. The right-sided chest tube is been removed. No evidence of pneumothorax.  10/28/20 CT chest-  Lungs/Pleura: Left lung is clear. Stable position of right basilar pleural drainage catheter. Right pleural effusion is significantly smaller compared to prior exam. However, mild amount of residual fluid and air remains predominantly posteriorly. Mild right basilar subsegmental atelectasis is noted. Right upper lobe airspace opacity noted on prior exam appears to have resolved.   No Known Allergies  Immunization History  Administered Date(s) Administered   PFIZER Comirnaty(Gray Top)Covid-19 Tri-Sucrose Vaccine 10/31/2020   PFIZER(Purple Top)SARS-COV-2 Vaccination 08/06/2019, 10/31/2019   Pneumococcal Polysaccharide-23 08/07/2012   Tdap 08/07/2012   Zoster Recombinat (Shingrix) 02/08/2020, 04/11/2020    Past Medical History:  Diagnosis Date   Aortic atherosclerosis ([place]) 10/25/2020   Diabetes mellitus without complication ([place])    Hypertension     Tobacco History: Social History   Tobacco Use  Smoking Status Never  Smokeless Tobacco Never   Counseling given: Not Answered   Outpatient Medications Prior to Visit  Medication Sig Dispense Refill   amLODipine (NORVASC) 10 MG tablet TAKE 1 TABLET(10 MG) BY MOUTH DAILY 90 tablet 3   Canagliflozin-metFORMIN HCl ER (INVOKAMET XR) [phone] MG TB24 Take 2  tablets by mouth daily. 180 tablet 1   diclofenac Sodium (VOLTAREN) 1 %  GEL Apply 2 g topically 4 (four) times daily. 100 g 3   Dulaglutide 3 MG/0.5ML SOPN Inject 3 mg into the skin once a week. 6 mL 0   glucose blood (ONE TOUCH ULTRA TEST) test strip Use as instructed 100 each 12   lisinopril (ZESTRIL) 40 MG tablet Take 1 tablet (40 mg total) by mouth daily. 90 tablet 3   hydrOXYzine (ATARAX/VISTARIL) 10 MG tablet Take 1-2 tablets (10-20 mg total) by mouth at bedtime as needed. (Patient not taking: Reported on 11/24/2020) 30 tablet 3   No facility-administered medications prior to visit.      Review of Systems  Review of Systems  Constitutional: Negative.  Negative for diaphoresis and fever.  Respiratory:  Negative for cough, chest tightness, shortness of breath and wheezing.   Musculoskeletal:        Right shoulder pain 2/10    Physical Exam  BP (!) 100/50   Pulse 100   Ht '5\' 2"'$  (1.575 m)   Wt 123 lb (55.8 kg)   SpO2 96%   BMI 22.50 kg/m  Physical Exam Constitutional:      Appearance: Normal appearance.  Cardiovascular:     Rate and Rhythm: Normal rate and regular rhythm.  Pulmonary:     Effort: No respiratory distress.     Breath sounds: No wheezing, rhonchi or rales.     Comments: CTA, diminished right base  Neurological:     Mental Status: He is alert.     Lab Results:  CBC    Component Value Date/Time   WBC 7.6 11/07/2020 1455   RBC 4.25 11/07/2020 1455   HGB 12.1 (L) 11/07/2020 1455   HGB 14.5 02/08/2020 1124   HCT 37.9 (L) 11/07/2020 1455   HCT 42.5 02/08/2020 1124   PLT 645 (H) 11/07/2020 1455   PLT 313 02/08/2020 1124   MCV 89.2 11/07/2020 1455   MCV 88 02/08/2020 1124   MCH 28.5 11/07/2020 1455   MCHC 31.9 (L) 11/07/2020 1455   RDW 11.5 11/07/2020 1455   RDW 12.1 02/08/2020 1124   LYMPHSABS 1,680 11/07/2020 1455   LYMPHSABS 2.7 11/23/2019 1602   MONOABS 1.2 (H) 10/21/2020 1626   EOSABS 84 11/07/2020 1455   EOSABS 0.2 11/23/2019 1602   BASOSABS 61 11/07/2020 1455   BASOSABS 0.1 11/23/2019 1602    BMET     Component Value Date/Time   NA 132 (L) 11/07/2020 1455   NA 138 02/08/2020 1124   K 4.8 11/07/2020 1455   CL 93 (L) 11/07/2020 1455   CO2 29 11/07/2020 1455   GLUCOSE 107 (H) 11/07/2020 1455   BUN 16 11/07/2020 1455   BUN 11 02/08/2020 1124   CREATININE 0.58 (L) 11/07/2020 1455   CALCIUM 9.3 11/07/2020 1455   GFRNONAA >60 10/31/2020 0424   GFRAA 124 02/08/2020 1124    BNP    Component Value Date/Time   BNP 30.3 10/21/2020 2124    ProBNP No results found for: PROBNP  Imaging: DG Chest 1 View  Result Date: 10/31/2020 CLINICAL DATA:  Pleural effusion EXAM: CHEST  1 VIEW COMPARISON:  10/30/2020 FINDINGS: Right basilar pigtail chest tube is unchanged. Small right pleural effusion or pleural thickening persists. There is stable right basilar atelectasis with rounded opacity within the right hilum related to loculated fluid within the right major fissure, better demonstrated on CT examination of 10/28/2020. Left lung is clear. No pneumothorax. Cardiac size  within normal limits. Pulmonary vascularity is normal. IMPRESSION: Right basilar pigtail chest tube in place. Stable small right pleural effusion or pleural thickening, associated right basilar atelectasis, and rounded opacity at the right hilum representing loculated fluid. No pneumothorax. Electronically Signed   By: Fidela Salisbury MD   On: 10/31/2020 06:29   DG Chest 2 View  Result Date: 11/24/2020 CLINICAL DATA:  Pleural effusion EXAM: CHEST - 2 VIEW COMPARISON:  Prior chest x-ray 10/31/2020 FINDINGS: The right-sided pigtail thoracostomy tube is been removed. Persistent small right pleural effusion versus chronic pleural thickening. Cardiac and mediastinal contours are unchanged. No pneumothorax. No new airspace opacity. No acute osseous abnormality. IMPRESSION: Persistent small right pleural effusion versus chronic pleural thickening. The right-sided chest tube is been removed. No evidence of pneumothorax. Electronically Signed   By:  Jacqulynn Cadet M.D.   On: 11/24/2020 11:21   CT CHEST W CONTRAST  Result Date: 10/28/2020 CLINICAL DATA:  Pleural effusion. EXAM: CT CHEST WITH CONTRAST TECHNIQUE: Multidetector CT imaging of the chest was performed during intravenous contrast administration. CONTRAST:  77m OMNIPAQUE IOHEXOL 300 MG/ML  SOLN COMPARISON:  October 22, 2020. FINDINGS: Cardiovascular: No evidence of thoracic aortic aneurysm or dissection. Normal cardiac size. No pericardial effusion. Coronary artery calcifications are noted. Mediastinum/Nodes: Stable left thyroid nodule measuring 13 mm. No adenopathy is noted. The esophagus is unremarkable. Lungs/Pleura: Left lung is clear. Stable position of right basilar pleural drainage catheter. Right pleural effusion is significantly smaller compared to prior exam. However, mild amount of residual fluid and air remains predominantly posteriorly. Mild right basilar subsegmental atelectasis is noted. Right upper lobe airspace opacity noted on prior exam appears to have resolved. Upper Abdomen: Stable enhancing abnormality seen anteriorly in left hepatic lobe most consistent with hemangioma. Musculoskeletal: No chest wall abnormality. No acute or significant osseous findings. IMPRESSION: Stable position of right basilar pleural drainage catheter. Right pleural effusion is significantly smaller compared to prior exam, although mild amount of residual fluid and air remains predominantly posteriorly. Mild right basilar subsegmental atelectasis is noted. Right upper lobe airspace opacity noted on prior exam has resolved. Stable probable hemangioma seen in left hepatic lobe. Coronary artery calcifications are noted. Electronically Signed   By: JMarijo ConceptionM.D.   On: 10/28/2020 11:35   DG CHEST PORT 1 VIEW  Result Date: 10/30/2020 CLINICAL DATA:  Chest tube.  Pleural effusion. EXAM: PORTABLE CHEST 1 VIEW COMPARISON:  10/29/2020.  CT 10/28/2020. FINDINGS: Right chest tube in stable position. No  pneumothorax. Tiny right pleural effusion and fissural fluid noted. Atelectatic changes right lung base. Heart size normal. Degenerative change thoracic spine. IMPRESSION: Right chest tube in stable position. No pneumothorax. Tiny right pleural effusion and fissural fluid again noted. Atelectatic changes right lung base again noted. Chest is unchanged from prior exam. Electronically Signed   By: TMarcello Moores Register   On: 10/30/2020 05:24   DG CHEST PORT 1 VIEW  Result Date: 10/29/2020 CLINICAL DATA:  65year old male with right-sided chest tube. EXAM: PORTABLE CHEST 1 VIEW COMPARISON:  Chest radiograph dated 10/28/2020. FINDINGS: Similar positioning of right-sided pigtail chest tube. No interval change in the appearance of the right lung base streaky density and possible trace effusion. No large effusion. No pneumothorax. The left lung is clear. Stable cardiomediastinal silhouette. No acute osseous pathology. IMPRESSION: No interval change in the positioning of the right-sided chest tube or right lung base densities. Electronically Signed   By: AAnner CreteM.D.   On: 10/29/2020 21:26   DG  CHEST PORT 1 VIEW  Result Date: 10/28/2020 CLINICAL DATA:  65 year old male with pleural effusion EXAM: PORTABLE CHEST 1 VIEW COMPARISON:  Chest radiograph dated 10/27/2020. FINDINGS: Right pleural drainage catheter in similar position. Slight interval decrease in the size of the right pleural effusion. Small right pleural effusion and associated atelectasis noted. The left lung is clear. No pneumothorax. Stable cardiomediastinal silhouette. No acute osseous pathology. IMPRESSION: Slight interval decrease in the size of the right pleural effusion. Electronically Signed   By: Anner Crete M.D.   On: 10/28/2020 20:33   DG Chest Port 1 View  Result Date: 10/27/2020 CLINICAL DATA:  Chest tube placement, chest pain EXAM: PORTABLE CHEST 1 VIEW COMPARISON:  10/26/2020 FINDINGS: Right basilar pigtail chest tube is  unchanged. Mild right-sided volume loss with elevation of the right hemidiaphragm, right basilar atelectasis, and small right pleural effusion are again noted. No pneumothorax. Left lung is clear. Cardiac size within normal limits. No acute bone abnormality. IMPRESSION: Stable examination with right basilar chest tube, right-sided volume loss with right basilar atelectasis, and small right pleural effusion. Electronically Signed   By: Fidela Salisbury MD   On: 10/27/2020 05:48   DG Chest Port 1 View  Result Date: 10/26/2020 CLINICAL DATA:  Respiratory failure, right-sided chest tube in position EXAM: PORTABLE CHEST 1 VIEW COMPARISON:  10/24/2020 FINDINGS: Cardiomegaly. Interval decrease in volume of a small right pleural effusion with right-sided chest tube in position. Redemonstrated associated atelectasis or consolidation. The left lung is normally aerated. The visualized skeletal structures are unremarkable. IMPRESSION: 1. Interval decrease in volume of a small right pleural effusion with right-sided chest tube in position. Redemonstrated associated atelectasis or consolidation. 2.  Cardiomegaly. Electronically Signed   By: Eddie Candle M.D.   On: 10/26/2020 10:30   MR ABDOMEN MRCP W WO CONTAST  Result Date: 11/15/2020 CLINICAL DATA:  Evaluate liver lesion EXAM: MRI ABDOMEN WITHOUT AND WITH CONTRAST (INCLUDING MRCP) TECHNIQUE: Multiplanar multisequence MR imaging of the abdomen was performed both before and after the administration of intravenous contrast. Heavily T2-weighted images of the biliary and pancreatic ducts were obtained, and three-dimensional MRCP images were rendered by post processing. CONTRAST:  5.60m GADAVIST GADOBUTROL 1 MMOL/ML IV SOLN COMPARISON:  10/28/2020 FINDINGS: Lower chest: Pleural thickening and enhancement within the right hemithorax is again identified. This measures 1.1 cm in thickness on today's study. Small volume of loculated pleural fluid is identified on the right.  Hepatobiliary: Hepatic steatosis noted. Within segment 2/3 there is a 1.5 cm T2 hyperintense, T1 hypointense lesion which shows persistent delayed enhancement compatible with a benign hemangioma, image 54/1102. No additional focal liver abnormality identified. Gallbladder is normal. Mild fusiform increase caliber of the CBD measures 7 mm. No choledocholithiasis. Pancreas: No mass, inflammatory changes, or other parenchymal abnormality identified. Spleen:  Within normal limits in size and appearance. Adrenals/Urinary Tract:  Normal adrenal glands. Right kidney normal. Tiny cyst identified within the inferior pole of left kidney measuring 6 mm. Stomach/Bowel: Visualized portions within the abdomen are unremarkable. Vascular/Lymphatic: No pathologically enlarged lymph nodes identified. No abdominal aortic aneurysm demonstrated. Other:  No free fluid or fluid collections. Musculoskeletal: No suspicious bone lesions identified. IMPRESSION: 1. Left hepatic lobe lesion corresponds to a benign hemangioma. 2. Hepatic steatosis 3. Again seen is diffuse pleural thickening and enhancement overlying the right lung. A trace amount of loculated pleural fluid is noted at this time. Etiology indeterminate. Findings may represent sequelae of inflammation or infection. Malignancy can also have this appearance. Clinical correlation advised.  4. Mild fusiform increase caliber of the CBD measures 7 mm. No choledocholithiasis or obstructing mass identified. Electronically Signed   By: Kerby Moors M.D.   On: 11/15/2020 12:50   MR ABDOMEN WITH MRCP W CONTRAST  Result Date: 11/15/2020 CLINICAL DATA:  Evaluate liver lesion EXAM: MRI ABDOMEN WITHOUT AND WITH CONTRAST (INCLUDING MRCP) TECHNIQUE: Multiplanar multisequence MR imaging of the abdomen was performed both before and after the administration of intravenous contrast. Heavily T2-weighted images of the biliary and pancreatic ducts were obtained, and three-dimensional MRCP images  were rendered by post processing. CONTRAST:  5.75m GADAVIST GADOBUTROL 1 MMOL/ML IV SOLN COMPARISON:  10/28/2020 FINDINGS: Lower chest: Pleural thickening and enhancement within the right hemithorax is again identified. This measures 1.1 cm in thickness on today's study. Small volume of loculated pleural fluid is identified on the right. Hepatobiliary: Hepatic steatosis noted. Within segment 2/3 there is a 1.5 cm T2 hyperintense, T1 hypointense lesion which shows persistent delayed enhancement compatible with a benign hemangioma, image 54/1102. No additional focal liver abnormality identified. Gallbladder is normal. Mild fusiform increase caliber of the CBD measures 7 mm. No choledocholithiasis. Pancreas: No mass, inflammatory changes, or other parenchymal abnormality identified. Spleen:  Within normal limits in size and appearance. Adrenals/Urinary Tract:  Normal adrenal glands. Right kidney normal. Tiny cyst identified within the inferior pole of left kidney measuring 6 mm. Stomach/Bowel: Visualized portions within the abdomen are unremarkable. Vascular/Lymphatic: No pathologically enlarged lymph nodes identified. No abdominal aortic aneurysm demonstrated. Other:  No free fluid or fluid collections. Musculoskeletal: No suspicious bone lesions identified. IMPRESSION: 1. Left hepatic lobe lesion corresponds to a benign hemangioma. 2. Hepatic steatosis 3. Again seen is diffuse pleural thickening and enhancement overlying the right lung. A trace amount of loculated pleural fluid is noted at this time. Etiology indeterminate. Findings may represent sequelae of inflammation or infection. Malignancy can also have this appearance. Clinical correlation advised. 4. Mild fusiform increase caliber of the CBD measures 7 mm. No choledocholithiasis or obstructing mass identified. Electronically Signed   By: TKerby MoorsM.D.   On: 11/15/2020 12:50     Assessment & Plan:   Pleural effusion Admitted 10/21/20 with  parapneumonic effusion. S/p pigtail chest tube with 2L removed. Fluid consistent with exudative effusion. Cytology negative. CXR on 6/28 showed residual small hydropneumothorax. Received IV Unasyn and completed 10 day course of Augmentin.   - Clinically doing well today. Denies fever, significant cough or shortness of breath. Right sided chest/shoulder pain has improved significantly  - CXR 11/24/2020 showed persistent small right pleural effusion vs chronic pleural thickening. No pneumothorax.  - No medication changes today - FU in 4 weeks with repeat CXR and OV with Dr. BLamonte Sakai/ if effusion still present would consider getting CT chest at that point    EMartyn Ehrich NP 11/24/2020

## 2020-12-02 ENCOUNTER — Telehealth: Payer: Self-pay | Admitting: Registered Nurse

## 2020-12-02 ENCOUNTER — Other Ambulatory Visit: Payer: Self-pay | Admitting: Registered Nurse

## 2020-12-02 MED ORDER — FLUCONAZOLE 150 MG PO TABS: 150.0000 mg | ORAL_TABLET | ORAL | 0 refills | Status: DC

## 2020-12-02 NOTE — Telephone Encounter (Signed)
Patient was seen here on 11/07/2020 - Diflucan and glucose monitoring that inserts in arm - nothing has been called in - [facility], [place]

## 2020-12-18 ENCOUNTER — Other Ambulatory Visit: Payer: Self-pay

## 2020-12-18 ENCOUNTER — Ambulatory Visit (INDEPENDENT_AMBULATORY_CARE_PROVIDER_SITE_OTHER): Payer: Medicare HMO | Admitting: Registered Nurse

## 2020-12-18 VITALS — BP 124/78 | HR 77 | Temp 98.3°F | Resp 17 | Ht 62.0 in | Wt 124.0 lb

## 2020-12-18 DIAGNOSIS — E1140 Type 2 diabetes mellitus with diabetic neuropathy, unspecified: Secondary | ICD-10-CM

## 2020-12-18 DIAGNOSIS — K769 Liver disease, unspecified: Secondary | ICD-10-CM | POA: Diagnosis not present

## 2020-12-18 DIAGNOSIS — Z8709 Personal history of other diseases of the respiratory system: Secondary | ICD-10-CM

## 2020-12-18 MED ORDER — GLUCOSE BLOOD VI STRP: ORAL_STRIP | 12 refills | Status: DC

## 2020-12-18 MED ORDER — ONETOUCH ULTRASOFT LANCETS MISC: 12 refills | Status: AC

## 2020-12-18 NOTE — Progress Notes (Signed)
Established Patient Office Visit  Subjective:  Patient ID: Edward [name], male    DOB: March 16, 1956  Age: 65 y.o. MRN: [id number]  CC:  Chief Complaint  Patient presents with   Diabetes    Pt made dieatary changes since last ov doing okay for now, no new concerns   Follow-up    Pt here for 6 week follow up liver lesion, lung check no new concerns feeling well     HPI Edward [name] presents for follow up   In brief: Admitted to ER on 10/21/20 with pleural effusion 10 day hospitalization, stabilized and dc Imaging noted coincidental finding of liver lesion We ordered MRCP of this lesion to further classify, showed, benign hemangioma.  A1c down to 9.6 as of last visit. Pt reports improvements in diet, feeling good Sugars at home running 110-150  Otherwise doing well, no more fever, fatigue, shob, doe, cough.  Rash on penis resolved. No new symptoms.   Past Medical History:  Diagnosis Date   Aortic atherosclerosis ([place]) 10/25/2020   Diabetes mellitus without complication ([place])    Hypertension     Past Surgical History:  Procedure Laterality Date   TUMOR REMOVAL      Family History  Problem Relation Age of Onset   Prostate cancer Father    Heart attack Paternal Grandfather     Social History   Socioeconomic History   Marital status: Married    Spouse name: Not on file   Number of children: 2   Years of education: 16   Highest education level: Not on file  Occupational History   Occupation: Loss adjuster, chartered  Tobacco Use   Smoking status: Never   Smokeless tobacco: Never  Vaping Use   Vaping Use: Never used  Substance and Sexual Activity   Alcohol use: No    Alcohol/week: 0.0 standard drinks   Drug use: No   Sexual activity: Not on file  Other Topics Concern   Not on file  Social History Narrative   Fun: Computers and playing games   Social Determinants of Health   Financial Resource Strain: Not on file  Food Insecurity: Not on file  Transportation Needs: Not  on file  Physical Activity: Not on file  Stress: Not on file  Social Connections: Not on file  Intimate Partner Violence: Not on file    Outpatient Medications Prior to Visit  Medication Sig Dispense Refill   amLODipine (NORVASC) 10 MG tablet TAKE 1 TABLET(10 MG) BY MOUTH DAILY 90 tablet 3   Canagliflozin-metFORMIN HCl ER (INVOKAMET XR) [phone] MG TB24 Take 2 tablets by mouth daily. 180 tablet 1   diclofenac Sodium (VOLTAREN) 1 % GEL Apply 2 g topically 4 (four) times daily. 100 g 3   Dulaglutide 3 MG/0.5ML SOPN Inject 3 mg into the skin once a week. 6 mL 0   fluconazole (DIFLUCAN) 150 MG tablet Take 1 tablet (150 mg total) by mouth every 3 (three) days. 2 tablet 0   glucose blood (ONE TOUCH ULTRA TEST) test strip Use as instructed 100 each 12   hydrOXYzine (ATARAX/VISTARIL) 10 MG tablet Take 1-2 tablets (10-20 mg total) by mouth at bedtime as needed. 30 tablet 3   lisinopril (ZESTRIL) 40 MG tablet Take 1 tablet (40 mg total) by mouth daily. 90 tablet 3   No facility-administered medications prior to visit.    No Known Allergies  ROS Review of Systems  Constitutional: Negative.   HENT: Negative.    Eyes: Negative.  Respiratory: Negative.    Cardiovascular: Negative.   Gastrointestinal: Negative.   Genitourinary: Negative.   Musculoskeletal: Negative.   Skin: Negative.   Neurological: Negative.   Psychiatric/Behavioral: Negative.    All other systems reviewed and are negative.    Objective:    Physical Exam Constitutional:      General: He is not in acute distress.    Appearance: Normal appearance. He is normal weight. He is not ill-appearing, toxic-appearing or diaphoretic.  Cardiovascular:     Rate and Rhythm: Normal rate and regular rhythm.     Heart sounds: Normal heart sounds. No murmur heard.   No friction rub. No gallop.  Pulmonary:     Effort: Pulmonary effort is normal. No respiratory distress.     Breath sounds: No stridor. No wheezing, rhonchi or rales.      Comments: Decreased breath sounds r lower lobe lung Chest:     Chest wall: No tenderness.  Neurological:     General: No focal deficit present.     Mental Status: He is alert and oriented to person, place, and time. Mental status is at baseline.  Psychiatric:        Mood and Affect: Mood normal.        Behavior: Behavior normal.        Thought Content: Thought content normal.        Judgment: Judgment normal.    BP 124/78   Pulse 77   Temp 98.3 F (36.8 C) (Temporal)   Resp 17   Ht '5\' 2"'$  (1.575 m)   Wt 124 lb (56.2 kg)   SpO2 96%   BMI 22.68 kg/m  Wt Readings from Last 3 Encounters:  12/18/20 124 lb (56.2 kg)  11/24/20 123 lb (55.8 kg)  11/07/20 122 lb 9.6 oz (55.6 kg)     Health Maintenance Due  Topic Date Due   INFLUENZA VACCINE  12/01/2020    There are no preventive care reminders to display for this patient.  Lab Results  Component Value Date   TSH 0.77 11/07/2020   Lab Results  Component Value Date   WBC 7.6 11/07/2020   HGB 12.1 (L) 11/07/2020   HCT 37.9 (L) 11/07/2020   MCV 89.2 11/07/2020   PLT 645 (H) 11/07/2020   Lab Results  Component Value Date   NA 132 (L) 11/07/2020   K 4.8 11/07/2020   CO2 29 11/07/2020   GLUCOSE 107 (H) 11/07/2020   BUN 16 11/07/2020   CREATININE 0.58 (L) 11/07/2020   BILITOT 0.4 11/07/2020   ALKPHOS 54 10/22/2020   AST 16 11/07/2020   ALT 22 11/07/2020   PROT 7.3 11/07/2020   ALBUMIN 2.8 (L) 10/22/2020   CALCIUM 9.3 11/07/2020   ANIONGAP 9 10/31/2020   GFR 144.70 02/10/2018   Lab Results  Component Value Date   CHOL 123 11/07/2020   Lab Results  Component Value Date   HDL 46 11/07/2020   Lab Results  Component Value Date   LDLCALC 56 11/07/2020   Lab Results  Component Value Date   TRIG 119 11/07/2020   Lab Results  Component Value Date   CHOLHDL 2.7 11/07/2020   Lab Results  Component Value Date   HGBA1C 9.6 (H) 11/07/2020      Assessment & Plan:   Problem List Items Addressed This  Visit   None  No orders of the defined types were placed in this encounter.   Follow-up: No follow-ups on file.   PLAN Sugars seem  great at home. 3 mo follow up for check on A1c. Diminished breath sounds on right lower lobe. Discussed ER precautions with patient. He will follow up with Dr. Lamonte Sakai as scheduled next week. Rash resolved. Can call in refill if recurs. Patient encouraged to call clinic with any questions, comments, or concerns.  Maximiano Coss, NP

## 2020-12-18 NOTE — Patient Instructions (Addendum)
Edward [name] -  Always a pleasure to see you  I am glad there's stability for you  Lungs sounded a little quiet at the bottom of the right lung. This may be nothing to worry about, but please keep your appointment with Dr. Lamonte Sakai next week to ensure this is ok.  Liver lesion appears entirely benign. It does not seem worrisome at all.  Let me know if stool changes get worse - any blood in stool, white stools, or black stool, call me.  See you in 3 months to check on diabetes. Until then, same medications, continue healthy diet, check sugars in the morning.  Thank you  Edward [name]    Edward [name] -  Lun hn h?nh ???c g?p b?n  Ti r?t vui v c s? ?n ??nh cho b?n  Ph?i nghe h?i m ? ?y ph?i ph?i. ?i?u ny c th? khng c g ?ng lo ng?i, nh?ng vui lng gi? l?ch h?n c?a b?n v?i Ti?n s? [place] vo tu?n t?i ?? ??m b?o vi?c ny ?n.  T?n th??ng gan hon ton lnh tnh. N khng c v? g ?ng lo ng?i c?.  Hy cho ti [name]?t n?u tnh tr?ng thay ??i phn tr? nn t?i t? h?n - b?t k? mu trong phn, phn tr?ng ho?c phn ?en, hy g?i cho ti.  H?n g?p b?n sau 3 thng n?a ?? ki?m tra b?nh ti?u ???ng. Cho ??n khi ?, cng m?t lo?i thu?c, ti?p t?c ch? ?? ?n u?ng lnh m?nh, ki?m tra l??ng ???ng vo bu?i sng.  C?m ?n b?n

## 2020-12-25 ENCOUNTER — Ambulatory Visit (INDEPENDENT_AMBULATORY_CARE_PROVIDER_SITE_OTHER): Payer: Medicare HMO

## 2020-12-25 ENCOUNTER — Ambulatory Visit: Payer: Medicare HMO | Admitting: Emergency Medicine

## 2020-12-25 ENCOUNTER — Ambulatory Visit: Payer: Medicare HMO

## 2020-12-25 ENCOUNTER — Other Ambulatory Visit: Payer: Self-pay

## 2020-12-25 ENCOUNTER — Encounter: Payer: Self-pay | Admitting: Emergency Medicine

## 2020-12-25 VITALS — BP 128/70 | HR 92 | Temp 98.2°F | Ht 62.0 in | Wt 126.0 lb

## 2020-12-25 DIAGNOSIS — J9811 Atelectasis: Secondary | ICD-10-CM | POA: Diagnosis not present

## 2020-12-25 DIAGNOSIS — J90 Pleural effusion, not elsewhere classified: Secondary | ICD-10-CM

## 2020-12-25 DIAGNOSIS — R911 Solitary pulmonary nodule: Secondary | ICD-10-CM | POA: Diagnosis not present

## 2020-12-25 NOTE — Assessment & Plan Note (Signed)
Residual right pleural fluid and apparent pleural thickening on chest x-ray, no change compared with 1 month ago.  He is asymptomatic, feels back to normal.  Suspect that this was parapneumonic, cytology negative.  I will perform a CT chest with contrast to evaluate the pleura, parenchyma.  If reassuring then we may not have to follow serial scans, just following clinically.

## 2020-12-25 NOTE — Progress Notes (Signed)
$'@Patient'v$  ID: Edward [name], male    DOB: 09-Jan-1956, 65 y.o.   MRN: [id number]  Chief Complaint  Patient presents with   Follow-up    SOB with any activity     Referring provider: Maximiano Coss, NP  HPI: 65 year old male, never smoked.  Past medical history significant for hypertension, aortic arthrosclerosis, pleural effusion, type 2 diabetes, vitamin B-12 deficiency.  Consulted inpatient by PCCM team on 10/21/2020 for large right pleural effusion.  Hospital course Patient presented to the emergency room with complaints of shortness of breath, chest pain and shoulder pain.  Patient had previously suffered a minor fall and had developed right shoulder pain.  He was referred to orthopedics for consult.  Shoulder x-ray outpatient revealed significant pleural effusion.  On arrival to ED patient's temperature was 101.5, he was tachycardic and respiratory rate was 22.  No oxygen desaturations.  Chest x-ray confirmed very large right-sided pleural effusion with complete atelectasis of right lung with mild mediastinal shift, additionally there is a 14 mm possible hyperenhancing nodule of left hepatic lobe.  PCCM consulted on 6/21, pigtail chest tube placed with approximately 2 L removed post insertion.  Fluid was consistent with exudative effusion.  Repeat CT chest on 6/28 showed improvement in right pleural effusion and mild amount of residual fluid and air.  Residual small hydropneumothorax. TCTS consulted as well, no benefit for surgical intervention.  Patient was treated with IV Zosyn, Augmentin initiated on 6/26 for 7 to 10 days. Cytology negative.   11/24/20 interim hx  Patient presents today for hospital follow-up. Accompanied by interpreter. He is doing well, still feels some weak. He has a rare intermittent dry cough. Right chest/should pain is still present but states that it is not as much as before, rating 2-3/10. No hemoptysis. He finished Augmentin course while in the hospital. He gets dizzy  when he changes position, this was mentioned to his PCP during most recent visit in July. Normal blood pressure is 120/70. He is on Norvasc '10mg'$  dail and Lisinopril '40mg'$  daily. MRCP on 11/15/20 showed left hepatic lobe lesion corresponds to benign hemangioma, hepatic steatosis. Diffuse pleural thickening and enhancement overlying the right lung. A trace amount of loculated pleural fluid. Denies fever, shortness of breath.   ROV 12/25/2020 --65 year old [place] gentleman with hypertension, diabetes who was admitted in June with fever, exudative pleural effusion (cytology negative), that was tube drained and felt to be parapneumonic.  He was treated with antibiotics. CXR from 11/24/20 and again today show small R effusion +/- thickening. Minimal cough. His breathing is doing well - denies any dyspnea or limitations. Minimal cough, no sputum. 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    Imaging: 11/24/20 CXR- Persistent small right pleural effusion versus chronic pleural thickening. The right-sided chest tube is been removed. No evidence of pneumothorax.  10/28/20 CT chest-  Lungs/Pleura: Left lung is clear. Stable position of right basilar pleural drainage catheter. Right pleural effusion is significantly smaller compared to prior exam. However, mild amount of residual fluid and air remains predominantly posteriorly. Mild right basilar subsegmental atelectasis is noted. Right upper lobe airspace opacity noted on prior exam appears to have resolved.  Chest x-ray 12/25/2020 reviewed by me, shows a stable small right pleural effusion without any progression   No Known Allergies  Immunization History  Administered Date(s) Administered   PFIZER Comirnaty(Gray Top)Covid-19 Tri-Sucrose Vaccine 10/31/2020   PFIZER(Purple Top)SARS-COV-2 Vaccination 08/06/2019, 10/31/2019   Pneumococcal Polysaccharide-23 08/07/2012  Tdap 08/07/2012   Zoster Recombinat (Shingrix)  02/08/2020, 04/11/2020    Past Medical History:  Diagnosis Date   Aortic atherosclerosis ([place]) 10/25/2020   Diabetes mellitus without complication ([facility])    Hypertension     Tobacco History: Social History   Tobacco Use  Smoking Status Never  Smokeless Tobacco Never   Counseling given: Not Answered   Outpatient Medications Prior to Visit  Medication Sig Dispense Refill   amLODipine (NORVASC) 10 MG tablet TAKE 1 TABLET(10 MG) BY MOUTH DAILY 90 tablet 3   Canagliflozin-metFORMIN HCl ER (INVOKAMET XR) [phone] MG TB24 Take 2 tablets by mouth daily. 180 tablet 1   diclofenac Sodium (VOLTAREN) 1 % GEL Apply 2 g topically 4 (four) times daily. 100 g 3   Dulaglutide 3 MG/0.5ML SOPN Inject 3 mg into the skin once a week. 6 mL 0   fluconazole (DIFLUCAN) 150 MG tablet Take 1 tablet (150 mg total) by mouth every 3 (three) days. 2 tablet 0   glucose blood (ONE TOUCH ULTRA TEST) test strip Use as instructed 100 each 12   hydrOXYzine (ATARAX/VISTARIL) 10 MG tablet Take 1-2 tablets (10-20 mg total) by mouth at bedtime as needed. 30 tablet 3   Lancets (ONETOUCH ULTRASOFT) lancets Use as instructed 100 each 12   lisinopril (ZESTRIL) 40 MG tablet Take 1 tablet (40 mg total) by mouth daily. 90 tablet 3   No facility-administered medications prior to visit.      Review of Systems  Review of Systems  Constitutional: Negative.  Negative for diaphoresis and fever.  Respiratory:  Negative for cough, chest tightness, shortness of breath and wheezing.   Musculoskeletal:        Right shoulder pain 2/10    Physical Exam  BP 128/70 (BP Location: Right Arm, Patient Position: Sitting, Cuff Size: Normal)   Pulse 92   Temp 98.2 F (36.8 C) (Oral)   Ht '5\' 2"'$  (1.575 m)   Wt 126 lb (57.2 kg)   SpO2 98%   BMI 23.05 kg/m   Gen: Pleasant, well-nourished, in no distress,  normal affect  ENT: No lesions,  mouth clear,  oropharynx clear, no postnasal drip  Neck: No JVD, no stridor  Lungs: No use  of accessory muscles, decreased BS R base, L clear. No crackles.   Cardiovascular: RRR, heart sounds normal, no murmur or gallops, no peripheral edema  Musculoskeletal: No deformities, no cyanosis or clubbing  Neuro: alert, awake, non focal. Conversing w assist from interpretor   Skin: Warm, no lesions or rashes    Lab Results:  CBC    Component Value Date/Time   WBC 7.6 11/07/2020 1455   RBC 4.25 11/07/2020 1455   HGB 12.1 (L) 11/07/2020 1455   HGB 14.5 02/08/2020 1124   HCT 37.9 (L) 11/07/2020 1455   HCT 42.5 02/08/2020 1124   PLT 645 (H) 11/07/2020 1455   PLT 313 02/08/2020 1124   MCV 89.2 11/07/2020 1455   MCV 88 02/08/2020 1124   MCH 28.5 11/07/2020 1455   MCHC 31.9 (L) 11/07/2020 1455   RDW 11.5 11/07/2020 1455   RDW 12.1 02/08/2020 1124   LYMPHSABS 1,680 11/07/2020 1455   LYMPHSABS 2.7 11/23/2019 1602   MONOABS 1.2 (H) 10/21/2020 1626   EOSABS 84 11/07/2020 1455   EOSABS 0.2 11/23/2019 1602   BASOSABS 61 11/07/2020 1455   BASOSABS 0.1 11/23/2019 1602    BMET    Component Value Date/Time   NA 132 (L) 11/07/2020 1455   NA 138 02/08/2020  1124   K 4.8 11/07/2020 1455   CL 93 (L) 11/07/2020 1455   CO2 29 11/07/2020 1455   GLUCOSE 107 (H) 11/07/2020 1455   BUN 16 11/07/2020 1455   BUN 11 02/08/2020 1124   CREATININE 0.58 (L) 11/07/2020 1455   CALCIUM 9.3 11/07/2020 1455   GFRNONAA >60 10/31/2020 0424   GFRAA 124 02/08/2020 1124    BNP    Component Value Date/Time   BNP 30.3 10/21/2020 2124    ProBNP No results found for: PROBNP  Imaging: No results found.   Assessment & Plan:   Pleural effusion Residual right pleural fluid and apparent pleural thickening on chest x-ray, no change compared with 1 month ago.  He is asymptomatic, feels back to normal.  Suspect that this was parapneumonic, cytology negative.  I will perform a CT chest with contrast to evaluate the pleura, parenchyma.  If reassuring then we may not have to follow serial scans, just  following clinically.   Time spent 31 minutes  Baltazar Apo, MD, PhD 12/25/2020, 12:20 PM [facility] [phone] or if no answer before 7:00PM call [phone] For any issues after 7:00PM please call eLink [phone]

## 2020-12-25 NOTE — Patient Instructions (Signed)
We will perform a CT scan of your chest with contrast to compare with your priors. Please follow-up with Dr. Lamonte Sakai after your CT so that we can review the results together.

## 2021-01-09 ENCOUNTER — Ambulatory Visit
Admission: RE | Admit: 2021-01-09 | Discharge: 2021-01-09 | Disposition: A | Discharge status: Home / Self Care | Payer: Medicare HMO | Source: Ambulatory Visit | Attending: Emergency Medicine | Admitting: Emergency Medicine

## 2021-01-09 DIAGNOSIS — I700 Atherosclerosis of aorta: Secondary | ICD-10-CM | POA: Diagnosis not present

## 2021-01-09 DIAGNOSIS — E041 Nontoxic single thyroid nodule: Secondary | ICD-10-CM | POA: Diagnosis not present

## 2021-01-09 DIAGNOSIS — J90 Pleural effusion, not elsewhere classified: Secondary | ICD-10-CM | POA: Diagnosis not present

## 2021-01-09 MED ORDER — IOPAMIDOL (ISOVUE-300) INJECTION 61%
75.0000 mL | Freq: Once | INTRAVENOUS | Status: AC | PRN
  Administered 2021-01-09: 75 mL via INTRAVENOUS

## 2021-02-23 ENCOUNTER — Encounter: Payer: Self-pay | Admitting: Registered Nurse

## 2021-02-23 DIAGNOSIS — H2513 Age-related nuclear cataract, bilateral: Secondary | ICD-10-CM | POA: Diagnosis not present

## 2021-02-23 DIAGNOSIS — H5203 Hypermetropia, bilateral: Secondary | ICD-10-CM | POA: Diagnosis not present

## 2021-02-23 DIAGNOSIS — E119 Type 2 diabetes mellitus without complications: Secondary | ICD-10-CM | POA: Diagnosis not present

## 2021-02-23 LAB — HM DIABETES EYE EXAM

## 2021-04-02 ENCOUNTER — Ambulatory Visit (INDEPENDENT_AMBULATORY_CARE_PROVIDER_SITE_OTHER): Payer: Medicare HMO | Admitting: Registered Nurse

## 2021-04-02 ENCOUNTER — Other Ambulatory Visit: Payer: Self-pay

## 2021-04-02 ENCOUNTER — Encounter: Payer: Self-pay | Admitting: Registered Nurse

## 2021-04-02 VITALS — BP 139/83 | Temp 98.3°F | Resp 18 | Ht 62.0 in | Wt 130.6 lb

## 2021-04-02 DIAGNOSIS — I700 Atherosclerosis of aorta: Secondary | ICD-10-CM | POA: Diagnosis not present

## 2021-04-02 DIAGNOSIS — R058 Other specified cough: Secondary | ICD-10-CM

## 2021-04-02 DIAGNOSIS — I10 Essential (primary) hypertension: Secondary | ICD-10-CM | POA: Diagnosis not present

## 2021-04-02 DIAGNOSIS — E1140 Type 2 diabetes mellitus with diabetic neuropathy, unspecified: Secondary | ICD-10-CM | POA: Diagnosis not present

## 2021-04-02 LAB — POCT GLYCOSYLATED HEMOGLOBIN (HGB A1C): Hemoglobin A1C: 8.1 % — AB (ref 4.0–5.6)

## 2021-04-02 MED ORDER — LISINOPRIL 40 MG PO TABS: 40.0000 mg | ORAL_TABLET | Freq: Every day | ORAL | 3 refills | Status: DC

## 2021-04-02 MED ORDER — AMLODIPINE BESYLATE 10 MG PO TABS: ORAL_TABLET | ORAL | 3 refills | Status: DC

## 2021-04-02 MED ORDER — ALBUTEROL SULFATE HFA 108 (90 BASE) MCG/ACT IN AERS: 2.0000 | INHALATION_SPRAY | Freq: Four times a day (QID) | RESPIRATORY_TRACT | 0 refills | Status: DC | PRN

## 2021-04-02 MED ORDER — DULAGLUTIDE 3 MG/0.5ML ~~LOC~~ SOAJ: 3.0000 mg | SUBCUTANEOUS | 0 refills | Status: DC

## 2021-04-02 MED ORDER — INVOKAMET XR 150-1000 MG PO TB24: 2.0000 | ORAL_TABLET | Freq: Every day | ORAL | 1 refills | Status: DC

## 2021-04-02 MED ORDER — BENZONATATE 100 MG PO CAPS: 100.0000 mg | ORAL_CAPSULE | Freq: Two times a day (BID) | ORAL | 0 refills | Status: DC | PRN

## 2021-04-02 NOTE — Patient Instructions (Addendum)
Edward [name] -  Doristine Devoid to see you  The sugars are doing well - down to 8.1  Continue to focus on a healthy diet that limits processed foods, avoids sugary beverages, and consists mostly of plants.  I want to see you in 3 months - our goal would be to get below 7.5.   If we get there, we can reduce follow up to every 6 months.  I have refilled all medications - no changes at this time  Thank you  Rich       Anh Bi -  R?t vui ???c g?p b?n  ???ng ?ang ho?t ??ng t?t - gi?m xu?ng cn 8,1  Ti?p t?c t?p trung vo ch? ?? ?n u?ng lnh m?nh, h?n ch? th?c ph?m ch? bi?n s?n, trnh ?? u?ng c ???ng v ch? y?u l th?c v?t.  Ti mu?n g?p b?n sau 3 thng n?a - m?c tiu c?a chng ta l ??t d??i 7,5.  N?u ??t ???c ?i?u ?, chng ti c th? gi?m theo di t?i ?a 6 thng m?t l?n.  Ti ? n?p l?i t?t c? cc lo?i thu?c - khng c thay ??i no vo lc ny  C?m ?n b?n,  Rich  If you have lab work done today you will be contacted with your lab results within the next 2 weeks.  If you have not heard from [place] then please contact us. The fastest way to get your results is to register for My Chart.   IF you received an x-ray today, you will receive an invoice from [facility]. Please contact [facility] at [phone] with questions or concerns regarding your invoice.   IF you received labwork today, you will receive an invoice from [facility]. Please contact [facility] at [phone] with questions or concerns regarding your invoice.   Our billing staff will not be able to assist you with questions regarding bills from these companies.  You will be contacted with the lab results as soon as they are available. The fastest way to get your results is to activate your My Chart account. Instructions are located on the last page of this paperwork. If you have not heard from [place] regarding the results in 2 weeks, please contact this office.

## 2021-04-02 NOTE — Progress Notes (Signed)
Established Patient Office Visit  Subjective:  Patient ID: Edward [name], male    DOB: May 04, 1955  Age: 65 y.o. MRN: [id number]  CC:  Chief Complaint  Patient presents with   Follow-up    Patient states he is here for 3 month follow up diabetes .    HPI Edward [name] presents for t2dm  Last A1c:  Lab Results  Component Value Date   HGBA1C 9.6 (H) 11/07/2020    Currently taking: dulaglutide 3mg  subq weekly, invokamet XR 150-1000mg  po bid No new complications Reports good compliance with medications Diet has been improved Exercise habits have been improved  Hypertension: Patient Currently taking: lisinopril 40mg  po qd, amlodipine 10mg  po qd Good effect. No AEs. Denies CV symptoms including: chest pain, shob, doe, headache, visual changes, fatigue, claudication, and dependent edema.  Does have hx of aortic atherosclerosis Previous readings and labs: BP Readings from Last 3 Encounters:  04/02/21 139/83  12/25/20 128/70  12/18/20 124/78   Lab Results  Component Value Date   CREATININE 0.58 (L) 11/07/2020    Cough -  A few times a week. Clear, whitish sputum at times No shob, doe, chest pain, fevers, chills, lightheadedness Feels distinctly different from recent pleural effusion. Has not taken anything for this   Past Medical History:  Diagnosis Date   Aortic atherosclerosis ([place]) 10/25/2020   Diabetes mellitus without complication ([place])    Hypertension     Past Surgical History:  Procedure Laterality Date   TUMOR REMOVAL      Family History  Problem Relation Age of Onset   Prostate cancer Father    Heart attack Paternal Grandfather     Social History   Socioeconomic History   Marital status: Married    Spouse name: Not on file   Number of children: 2   Years of education: 16   Highest education level: Not on file  Occupational History   Occupation: Loss adjuster, chartered  Tobacco Use   Smoking status: Never   Smokeless tobacco: Never  Vaping Use   Vaping Use:  Never used  Substance and Sexual Activity   Alcohol use: No    Alcohol/week: 0.0 standard drinks   Drug use: No   Sexual activity: Not on file  Other Topics Concern   Not on file  Social History Narrative   Fun: Computers and playing games   Social Determinants of Health   Financial Resource Strain: Not on file  Food Insecurity: Not on file  Transportation Needs: Not on file  Physical Activity: Not on file  Stress: Not on file  Social Connections: Not on file  Intimate Partner Violence: Not on file    Outpatient Medications Prior to Visit  Medication Sig Dispense Refill   amLODipine (NORVASC) 10 MG tablet TAKE 1 TABLET(10 MG) BY MOUTH DAILY 90 tablet 3   Canagliflozin-metFORMIN HCl ER (INVOKAMET XR) [phone] MG TB24 Take 2 tablets by mouth daily. 180 tablet 1   diclofenac Sodium (VOLTAREN) 1 % GEL Apply 2 g topically 4 (four) times daily. 100 g 3   Dulaglutide 3 MG/0.5ML SOPN Inject 3 mg into the skin once a week. 6 mL 0   fluconazole (DIFLUCAN) 150 MG tablet Take 1 tablet (150 mg total) by mouth every 3 (three) days. 2 tablet 0   glucose blood (ONE TOUCH ULTRA TEST) test strip Use as instructed 100 each 12   hydrOXYzine (ATARAX/VISTARIL) 10 MG tablet Take 1-2 tablets (10-20 mg total) by mouth at bedtime as needed. [place]  tablet 3   Lancets (ONETOUCH ULTRASOFT) lancets Use as instructed 100 each 12   lisinopril (ZESTRIL) 40 MG tablet Take 1 tablet (40 mg total) by mouth daily. 90 tablet 3   No facility-administered medications prior to visit.    No Known Allergies  ROS Review of Systems  Constitutional: Negative.   HENT: Negative.    Eyes: Negative.   Respiratory: Negative.    Cardiovascular: Negative.   Gastrointestinal: Negative.   Genitourinary: Negative.   Musculoskeletal: Negative.   Skin: Negative.   Neurological: Negative.   Psychiatric/Behavioral: Negative.    All other systems reviewed and are negative.    Objective:    Physical Exam Constitutional:       General: He is not in acute distress.    Appearance: Normal appearance. He is normal weight. He is not ill-appearing, toxic-appearing or diaphoretic.  Cardiovascular:     Rate and Rhythm: Normal rate and regular rhythm.     Heart sounds: Normal heart sounds. No murmur heard.   No friction rub. No gallop.  Pulmonary:     Effort: Pulmonary effort is normal. No respiratory distress.     Breath sounds: Normal breath sounds. No stridor. No wheezing, rhonchi or rales.  Chest:     Chest wall: No tenderness.  Neurological:     General: No focal deficit present.     Mental Status: He is alert and oriented to person, place, and time. Mental status is at baseline.  Psychiatric:        Mood and Affect: Mood normal.        Behavior: Behavior normal.        Thought Content: Thought content normal.        Judgment: Judgment normal.    There were no vitals taken for this visit. Wt Readings from Last 3 Encounters:  12/25/20 126 lb (57.2 kg)  12/18/20 124 lb (56.2 kg)  11/24/20 123 lb (55.8 kg)     Health Maintenance Due  Topic Date Due   Pneumonia Vaccine 52+ Years old (2 - PCV) 08/07/2013   INFLUENZA VACCINE  Never done   COVID-19 Vaccine (4 - Booster for Pfizer series) 12/26/2020    There are no preventive care reminders to display for this patient.  Lab Results  Component Value Date   TSH 0.77 11/07/2020   Lab Results  Component Value Date   WBC 7.6 11/07/2020   HGB 12.1 (L) 11/07/2020   HCT 37.9 (L) 11/07/2020   MCV 89.2 11/07/2020   PLT 645 (H) 11/07/2020   Lab Results  Component Value Date   NA 132 (L) 11/07/2020   K 4.8 11/07/2020   CO2 29 11/07/2020   GLUCOSE 107 (H) 11/07/2020   BUN 16 11/07/2020   CREATININE 0.58 (L) 11/07/2020   BILITOT 0.4 11/07/2020   ALKPHOS 54 10/22/2020   AST 16 11/07/2020   ALT 22 11/07/2020   PROT 7.3 11/07/2020   ALBUMIN 2.8 (L) 10/22/2020   CALCIUM 9.3 11/07/2020   ANIONGAP 9 10/31/2020   GFR 144.70 02/10/2018   Lab Results   Component Value Date   CHOL 123 11/07/2020   Lab Results  Component Value Date   HDL 46 11/07/2020   Lab Results  Component Value Date   LDLCALC 56 11/07/2020   Lab Results  Component Value Date   TRIG 119 11/07/2020   Lab Results  Component Value Date   CHOLHDL 2.7 11/07/2020   Lab Results  Component Value Date   HGBA1C 9.6 (  H) 11/07/2020      Assessment & Plan:   Problem List Items Addressed This Visit       Cardiovascular and Mediastinum   Essential hypertension   Relevant Medications   lisinopril (ZESTRIL) 40 MG tablet   amLODipine (NORVASC) 10 MG tablet     Endocrine   Type 2 diabetes mellitus (HCC) - Primary   Relevant Medications   lisinopril (ZESTRIL) 40 MG tablet   Dulaglutide 3 MG/0.5ML SOPN   Canagliflozin-metFORMIN HCl ER (INVOKAMET XR) [phone] MG TB24   Other Relevant Orders   POCT glycosylated hemoglobin (Hb A1C) (Completed)    Meds ordered this encounter  Medications   lisinopril (ZESTRIL) 40 MG tablet    Sig: Take 1 tablet (40 mg total) by mouth daily.    Dispense:  90 tablet    Refill:  3    Order Specific Question:   Supervising Provider    Answer:   Carlota Raspberry, JEFFREY R [[id number]]   Dulaglutide 3 MG/0.5ML SOPN    Sig: Inject 3 mg into the skin once a week.    Dispense:  6 mL    Refill:  0    Order Specific Question:   Supervising Provider    Answer:   Carlota Raspberry, JEFFREY R [[id number]]   Canagliflozin-metFORMIN HCl ER (INVOKAMET XR) [phone] MG TB24    Sig: Take 2 tablets by mouth daily.    Dispense:  180 tablet    Refill:  1    Order Specific Question:   Supervising Provider    Answer:   Carlota Raspberry, JEFFREY R [[id number]]   amLODipine (NORVASC) 10 MG tablet    Sig: TAKE 1 TABLET(10 MG) BY MOUTH DAILY    Dispense:  90 tablet    Refill:  3    Order Specific Question:   Supervising Provider    Answer:   Carlota Raspberry, JEFFREY R [[id number]]   benzonatate (TESSALON) 100 MG capsule    Sig: Take 1 capsule (100 mg total) by mouth 2 (two) times daily as needed for  cough.    Dispense:  20 capsule    Refill:  0    Order Specific Question:   Supervising Provider    Answer:   Carlota Raspberry, JEFFREY R [[id number]]   albuterol (VENTOLIN HFA) 108 (90 Base) MCG/ACT inhaler    Sig: Inhale 2 puffs into the lungs every 6 (six) hours as needed for wheezing or shortness of breath.    Dispense:  8 g    Refill:  0    Order Specific Question:   Supervising Provider    Answer:   Carlota Raspberry, JEFFREY R [[id number]]    Follow-up: Return in about 3 months (around 07/01/2021) for t2dm.   PLAN A1c today at 8.1 BP controlled. Continue current medications Albuterol and tessalon for cough.doubt infectious. More likely environmental/allergies. Follow up in 3 mo Patient encouraged to call clinic with any questions, comments, or concerns.   Maximiano Coss, NP

## 2021-07-01 ENCOUNTER — Ambulatory Visit (INDEPENDENT_AMBULATORY_CARE_PROVIDER_SITE_OTHER): Payer: Medicare HMO | Admitting: Registered Nurse

## 2021-07-01 ENCOUNTER — Encounter: Payer: Self-pay | Admitting: Registered Nurse

## 2021-07-01 VITALS — BP 163/80 | HR 88 | Temp 97.9°F | Resp 16 | Ht 62.0 in | Wt 131.2 lb

## 2021-07-01 DIAGNOSIS — E1140 Type 2 diabetes mellitus with diabetic neuropathy, unspecified: Secondary | ICD-10-CM | POA: Diagnosis not present

## 2021-07-01 DIAGNOSIS — I10 Essential (primary) hypertension: Secondary | ICD-10-CM

## 2021-07-01 LAB — COMPREHENSIVE METABOLIC PANEL
ALT: 17 U/L (ref 0–53)
AST: 18 U/L (ref 0–37)
Albumin: 4.2 g/dL (ref 3.5–5.2)
Alkaline Phosphatase: 81 U/L (ref 39–117)
BUN: 11 mg/dL (ref 6–23)
CO2: 32 mEq/L (ref 19–32)
Calcium: 9.4 mg/dL (ref 8.4–10.5)
Chloride: 98 mEq/L (ref 96–112)
Creatinine, Ser: 0.56 mg/dL (ref 0.40–1.50)
GFR: 102.9 mL/min (ref >60.00)
Glucose, Bld: 120 mg/dL — ABNORMAL HIGH (ref 70–99)
Potassium: 4.1 mEq/L (ref 3.5–5.1)
Sodium: 137 mEq/L (ref 135–145)
Total Bilirubin: 0.5 mg/dL (ref 0.2–1.2)
Total Protein: 8.3 g/dL (ref 6.0–8.3)

## 2021-07-01 LAB — LIPID PANEL
Cholesterol: 108 mg/dL (ref 0–200)
HDL: 41.2 mg/dL (ref >39.00)
LDL Cholesterol: 50 mg/dL (ref 0–99)
NonHDL: 67.21
Total CHOL/HDL Ratio: 3
Triglycerides: 88 mg/dL (ref 0.0–149.0)
VLDL: 17.6 mg/dL (ref 0.0–40.0)

## 2021-07-01 LAB — POCT GLYCOSYLATED HEMOGLOBIN (HGB A1C): Hemoglobin A1C: 9.3 % — AB (ref 4.0–5.6)

## 2021-07-01 MED ORDER — DEXCOM G6 SENSOR MISC: 1 refills | Status: DC

## 2021-07-01 MED ORDER — LISINOPRIL 40 MG PO TABS: 40.0000 mg | ORAL_TABLET | Freq: Every day | ORAL | 3 refills | Status: DC

## 2021-07-01 MED ORDER — DEXCOM G6 RECEIVER DEVI: 1.0000 | Freq: Once | 0 refills | Status: AC

## 2021-07-01 MED ORDER — DEXCOM G6 TRANSMITTER MISC: 1 refills | Status: DC

## 2021-07-01 MED ORDER — AMLODIPINE BESYLATE 10 MG PO TABS: ORAL_TABLET | ORAL | 3 refills | Status: DC

## 2021-07-01 MED ORDER — DULAGLUTIDE 3 MG/0.5ML ~~LOC~~ SOAJ: 3.0000 mg | SUBCUTANEOUS | 0 refills | Status: DC

## 2021-07-01 MED ORDER — INVOKAMET XR 150-1000 MG PO TB24: 2.0000 | ORAL_TABLET | Freq: Every day | ORAL | 1 refills | Status: DC

## 2021-07-01 MED ORDER — GLIPIZIDE 5 MG PO TABS: 5.0000 mg | ORAL_TABLET | Freq: Two times a day (BID) | ORAL | 0 refills | Status: DC

## 2021-07-01 NOTE — Progress Notes (Signed)
Established Patient Office Visit  Subjective:  Patient ID: Edward [name], male    DOB: 08/11/1955  Age: 66 y.o. MRN: [id number]  CC:  Chief Complaint  Patient presents with   Follow-up    3 month follow up on diabetes and HTN.    HPI [facility] presents for t2dm, htn  Last A1c:  Lab Results  Component Value Date   HGBA1C 9.3 (A) 07/01/2021    Currently taking: dulaglutide 3mg  subq weekly, invokamet xr 150-1000mg  po bid ac,  No new complications Reports good compliance with medications Diet has been worse than previous Exercise habits have been limited  Hypertension: Patient Currently taking: lisinopril 40mg  po qd, amlodipine 10mg  po qd. Good effect. No AEs. Notes he did not take these today.  Denies CV symptoms including: chest pain, shob, doe, headache, visual changes, fatigue, claudication, and dependent edema.   Previous readings and labs: BP Readings from Last 3 Encounters:  07/01/21 (!) 163/80  04/02/21 139/83  12/25/20 128/70   Lab Results  Component Value Date   CREATININE 0.58 (L) 11/07/2020      Past Medical History:  Diagnosis Date   Aortic atherosclerosis ([place]) 10/25/2020   Diabetes mellitus without complication (H[facility])    Hypertension     Past Surgical History:  Procedure Laterality Date   TUMOR REMOVAL      Family History  Problem Relation Age of Onset   Prostate cancer Father    Heart attack Paternal Grandfather     Social History   Socioeconomic History   Marital status: Married    Spouse name: Not on file   Number of children: 2   Years of education: 16   Highest education level: Not on file  Occupational History   Occupation: Loss adjuster, chartered  Tobacco Use   Smoking status: Never   Smokeless tobacco: Never  Vaping Use   Vaping Use: Never used  Substance and Sexual Activity   Alcohol use: No    Alcohol/week: 0.0 standard drinks   Drug use: No   Sexual activity: Not on file  Other Topics Concern   Not on file  Social History  Narrative   Fun: Computers and playing games   Social Determinants of Health   Financial Resource Strain: Not on file  Food Insecurity: Not on file  Transportation Needs: Not on file  Physical Activity: Not on file  Stress: Not on file  Social Connections: Not on file  Intimate Partner Violence: Not on file    Outpatient Medications Prior to Visit  Medication Sig Dispense Refill   albuterol (VENTOLIN HFA) 108 (90 Base) MCG/ACT inhaler Inhale 2 puffs into the lungs every 6 (six) hours as needed for wheezing or shortness of breath. 8 g 0   benzonatate (TESSALON) 100 MG capsule Take 1 capsule (100 mg total) by mouth 2 (two) times daily as needed for cough. 20 capsule 0   diclofenac Sodium (VOLTAREN) 1 % GEL Apply 2 g topically 4 (four) times daily. 100 g 3   glucose blood (ONE TOUCH ULTRA TEST) test strip Use as instructed 100 each 12   hydrOXYzine (ATARAX/VISTARIL) 10 MG tablet Take 1-2 tablets (10-20 mg total) by mouth at bedtime as needed. 30 tablet 3   Lancets (ONETOUCH ULTRASOFT) lancets Use as instructed 100 each 12   amLODipine (NORVASC) 10 MG tablet TAKE 1 TABLET(10 MG) BY MOUTH DAILY 90 tablet 3   Canagliflozin-metFORMIN HCl ER (INVOKAMET XR) [phone] MG TB24 Take 2 tablets by mouth daily. [place]  tablet 1   Dulaglutide 3 MG/0.5ML SOPN Inject 3 mg into the skin once a week. 6 mL 0   fluconazole (DIFLUCAN) 150 MG tablet Take 1 tablet (150 mg total) by mouth every 3 (three) days. 2 tablet 0   lisinopril (ZESTRIL) 40 MG tablet Take 1 tablet (40 mg total) by mouth daily. 90 tablet 3   No facility-administered medications prior to visit.    No Known Allergies  ROS Review of Systems  Constitutional: Negative.   HENT: Negative.    Eyes: Negative.   Respiratory: Negative.    Cardiovascular: Negative.   Gastrointestinal: Negative.   Genitourinary: Negative.   Musculoskeletal: Negative.   Skin: Negative.   Neurological: Negative.   Psychiatric/Behavioral: Negative.    All other  systems reviewed and are negative.    Objective:    Physical Exam Constitutional:      General: He is not in acute distress.    Appearance: Normal appearance. He is normal weight. He is not ill-appearing, toxic-appearing or diaphoretic.  Cardiovascular:     Rate and Rhythm: Normal rate and regular rhythm.     Heart sounds: Normal heart sounds. No murmur heard.   No friction rub. No gallop.  Pulmonary:     Effort: Pulmonary effort is normal. No respiratory distress.     Breath sounds: Normal breath sounds. No stridor. No wheezing, rhonchi or rales.  Chest:     Chest wall: No tenderness.  Neurological:     General: No focal deficit present.     Mental Status: He is alert and oriented to person, place, and time. Mental status is at baseline.  Psychiatric:        Mood and Affect: Mood normal.        Behavior: Behavior normal.        Thought Content: Thought content normal.        Judgment: Judgment normal.    BP (!) 163/80 Comment: patient did not take the medication   Pulse 88    Temp 97.9 F (36.6 C) (Temporal)    Resp 16    Ht 5\' 2"  (1.575 m)    Wt 131 lb 3.2 oz (59.5 kg)    SpO2 100%    BMI 24.00 kg/m  Wt Readings from Last 3 Encounters:  07/01/21 131 lb 3.2 oz (59.5 kg)  04/02/21 130 lb 9.6 oz (59.2 kg)  12/25/20 126 lb (57.2 kg)     Health Maintenance Due  Topic Date Due   Pneumonia Vaccine 52+ Years old (2 - PCV) 08/07/2013   INFLUENZA VACCINE  Never done   COVID-19 Vaccine (4 - Booster for Pfizer series) 12/26/2020    There are no preventive care reminders to display for this patient.  Lab Results  Component Value Date   TSH 0.77 11/07/2020   Lab Results  Component Value Date   WBC 7.6 11/07/2020   HGB 12.1 (L) 11/07/2020   HCT 37.9 (L) 11/07/2020   MCV 89.2 11/07/2020   PLT 645 (H) 11/07/2020   Lab Results  Component Value Date   NA 132 (L) 11/07/2020   K 4.8 11/07/2020   CO2 29 11/07/2020   GLUCOSE 107 (H) 11/07/2020   BUN 16 11/07/2020    CREATININE 0.58 (L) 11/07/2020   BILITOT 0.4 11/07/2020   ALKPHOS 54 10/22/2020   AST 16 11/07/2020   ALT 22 11/07/2020   PROT 7.3 11/07/2020   ALBUMIN 2.8 (L) 10/22/2020   CALCIUM 9.3 11/07/2020   ANIONGAP 9 10/31/2020  GFR 144.70 02/10/2018   Lab Results  Component Value Date   CHOL 123 11/07/2020   Lab Results  Component Value Date   HDL 46 11/07/2020   Lab Results  Component Value Date   LDLCALC 56 11/07/2020   Lab Results  Component Value Date   TRIG 119 11/07/2020   Lab Results  Component Value Date   CHOLHDL 2.7 11/07/2020   Lab Results  Component Value Date   HGBA1C 9.3 (A) 07/01/2021      Assessment & Plan:   Problem List Items Addressed This Visit       Cardiovascular and Mediastinum   Essential hypertension   Relevant Medications   lisinopril (ZESTRIL) 40 MG tablet   amLODipine (NORVASC) 10 MG tablet   Continuous Blood Gluc Receiver (DEXCOM G6 RECEIVER) DEVI   Continuous Blood Gluc Sensor (DEXCOM G6 SENSOR) MISC   Continuous Blood Gluc Transmit (DEXCOM G6 TRANSMITTER) MISC   Other Relevant Orders   Comprehensive metabolic panel   Lipid panel     Endocrine   Type 2 diabetes mellitus (HCC) - Primary   Relevant Medications   lisinopril (ZESTRIL) 40 MG tablet   Dulaglutide 3 MG/0.5ML SOPN   Canagliflozin-metFORMIN HCl ER (INVOKAMET XR) [phone] MG TB24   Continuous Blood Gluc Receiver (DEXCOM G6 RECEIVER) DEVI   Continuous Blood Gluc Sensor (DEXCOM G6 SENSOR) MISC   Continuous Blood Gluc Transmit (DEXCOM G6 TRANSMITTER) MISC   glipiZIDE (GLUCOTROL) 5 MG tablet   Other Relevant Orders   POCT glycosylated hemoglobin (Hb A1C) (Completed)   Comprehensive metabolic panel   Lipid panel    Meds ordered this encounter  Medications   lisinopril (ZESTRIL) 40 MG tablet    Sig: Take 1 tablet (40 mg total) by mouth daily.    Dispense:  90 tablet    Refill:  3    Order Specific Question:   Supervising Provider    Answer:   Carlota Raspberry, JEFFREY R  [[id number]]   Dulaglutide 3 MG/0.5ML SOPN    Sig: Inject 3 mg into the skin once a week.    Dispense:  6 mL    Refill:  0    Order Specific Question:   Supervising Provider    Answer:   Carlota Raspberry, JEFFREY R [[id number]]   Canagliflozin-metFORMIN HCl ER (INVOKAMET XR) [phone] MG TB24    Sig: Take 2 tablets by mouth daily.    Dispense:  180 tablet    Refill:  1    Order Specific Question:   Supervising Provider    Answer:   Carlota Raspberry, JEFFREY R [[id number]]   amLODipine (NORVASC) 10 MG tablet    Sig: TAKE 1 TABLET(10 MG) BY MOUTH DAILY    Dispense:  90 tablet    Refill:  3    Order Specific Question:   Supervising Provider    Answer:   Carlota Raspberry, JEFFREY R [[id number]]   Continuous Blood Gluc Receiver ([place]) DEVI    Sig: 1 each by Does not apply route once for 1 dose.    Dispense:  1 each    Refill:  0    Order Specific Question:   Supervising Provider    Answer:   Carlota Raspberry, JEFFREY R [[id number]]   Continuous Blood Gluc Sensor (DEXCOM G6 SENSOR) MISC    Sig: Apply per package instructions once every 10 days.    Dispense:  9 each    Refill:  1    Order Specific Question:   Supervising Provider    Answer:  GRE[place], JEFFREY R [[id number]]   Continuous Blood Gluc Transmit (DEXCOM G6 TRANSMITTER) MISC    Sig: Apply once per package instructions every 3 months.    Dispense:  1 each    Refill:  1    Order Specific Question:   Supervising Provider    Answer:   Carlota Raspberry, JEFFREY R [[id number]]   glipiZIDE (GLUCOTROL) 5 MG tablet    Sig: Take 1 tablet (5 mg total) by mouth 2 (two) times daily before a meal.    Dispense:  180 tablet    Refill:  0    Order Specific Question:   Supervising Provider    Answer:   Carlota Raspberry, JEFFREY R [[id number]]    Follow-up: Return in about 3 months (around 10/01/2021) for t2dm.   PLAN Start on dexcom constant glucose monitoring. Start glipizide 5mg  po bid ac Return in 3 mo Suspect BP continues to be well controlled - pt has not taken medication today Labs collected. Will follow up with the  patient as warranted. Patient encouraged to call clinic with any questions, comments, or concerns.  Maximiano Coss, NP

## 2021-07-01 NOTE — Patient Instructions (Addendum)
Mr. Edward [name] - ? ?Great to see you ? ?Sugars are up a little bit. Let's get you the glucose monitor. Use this to watch your sugars. ? ?We can start you on glipizide 5mg  twice daily with breakfast and lunch. If your sugars go below 80, skip your next dose of glipizide.  ? ?See you in 3 months. Call sooner if concerns arise. ? ?Thank you, ? ?Rich  ? ? ? ?Anh B?i - ? ?R?t vui ???c g?p b?n ? ????ng t?ng l?n m?t ch?t. H?y l?y cho b?n m?y ?o ???ng huy?t. S? d?ng c?i n?y ?? xem l??ng ???ng c?a b?n. ? ?Ch?ng t?i c? th? b?t ??u cho b?n d?ng glipizide 5mg  hai l?n m?i ng?y v?o b?a s?ng v? b?a tr?a. N?u l??ng ???ng c?a b?n xu?ng d??i 80, h?y b? qua li?u glipizide ti?p theo. ? ?H?n g?p l?i sau 3 th?ng n?a. G?i s?m h?n n?u lo l?ng ph?t sinh. ? ?C?m ?n, ? ? ? ?If you have lab work done today you will be contacted with your lab results within the next 2 weeks.  If you have not heard from [place] then please contact us. The fastest way to get your results is to register for My Chart. ? ? ?IF you received an x-ray today, you will receive an invoice from [facility]. Please contact [facility] at [phone] with questions or concerns regarding your invoice.  ? ?IF you received labwork today, you will receive an invoice from [facility]. Please contact [facility] at [phone] with questions or concerns regarding your invoice.  ? ?Our billing staff will not be able to assist you with questions regarding bills from these companies. ? ?You will be contacted with the lab results as soon as they are available. The fastest way to get your results is to activate your My Chart account. Instructions are located on the last page of this paperwork. If you have not heard from [place] regarding the results in 2 weeks, please contact this office. ?  ? ? ?

## 2021-10-08 ENCOUNTER — Other Ambulatory Visit: Payer: Self-pay

## 2021-10-08 ENCOUNTER — Encounter: Payer: Self-pay | Admitting: Registered Nurse

## 2021-10-08 ENCOUNTER — Ambulatory Visit
Admission: RE | Admit: 2021-10-08 | Discharge: 2021-10-08 | Disposition: A | Discharge status: Home / Self Care | Payer: Medicare HMO | Source: Ambulatory Visit | Attending: Registered Nurse | Admitting: Registered Nurse

## 2021-10-08 ENCOUNTER — Ambulatory Visit (INDEPENDENT_AMBULATORY_CARE_PROVIDER_SITE_OTHER): Payer: Medicare HMO | Admitting: Registered Nurse

## 2021-10-08 VITALS — BP 124/70 | HR 77 | Temp 98.4°F | Resp 18 | Ht 62.0 in | Wt 134.2 lb

## 2021-10-08 DIAGNOSIS — R0781 Pleurodynia: Secondary | ICD-10-CM

## 2021-10-08 DIAGNOSIS — R1011 Right upper quadrant pain: Secondary | ICD-10-CM

## 2021-10-08 DIAGNOSIS — R079 Chest pain, unspecified: Secondary | ICD-10-CM | POA: Diagnosis not present

## 2021-10-08 DIAGNOSIS — E041 Nontoxic single thyroid nodule: Secondary | ICD-10-CM

## 2021-10-08 DIAGNOSIS — E1140 Type 2 diabetes mellitus with diabetic neuropathy, unspecified: Secondary | ICD-10-CM

## 2021-10-08 LAB — POCT GLYCOSYLATED HEMOGLOBIN (HGB A1C): Hemoglobin A1C: 9.9 % — AB (ref 4.0–5.6)

## 2021-10-08 MED ORDER — INSULIN GLARGINE 100 UNIT/ML SOLOSTAR PEN: 14.0000 [IU] | PEN_INJECTOR | Freq: Every day | SUBCUTANEOUS | 1 refills | Status: DC

## 2021-10-08 NOTE — Progress Notes (Signed)
Established Patient Office Visit  Subjective:  Patient ID: Edward [name], male    DOB: [birth year]  Age: 66 y.o. MRN: [id number]  CC:  Chief Complaint  Patient presents with   Diabetes    Patient is here for follow up on diabetes.    HPI Edward [name] presents for t2dm  Last A1c:  Lab Results  Component Value Date   HGBA1C 9.9 (A) 10/08/2021    Currently taking: canagliflozin-metformin 150-'1000mg'$  XR po bid ac, dulaglutide '3mg'$  subq weekly, glipizide '5mg'$  po bid ac. No new complications Reports good compliance with medications Diet has been steady Exercise habits have been steady.   RUQ pain Intermittent, mild, Lower ribs, RUQ Nontender No nvd or sudden weight changes No melena or hematochezia. Known hepatic steatosis last noted on CT chest 01/09/21 Did have an R hemithorax due to pleural effusion in June 2022. Resolved after tx with pulm Does not have any current respiratory complaints.  Thyroid nodule Incidental finding on CT chest 01/09/21 Will obtain US per radiology recommendations.  Outpatient Medications Prior to Visit  Medication Sig Dispense Refill   albuterol (VENTOLIN HFA) 108 (90 Base) MCG/ACT inhaler Inhale 2 puffs into the lungs every 6 (six) hours as needed for wheezing or shortness of breath. 8 g 0   amLODipine (NORVASC) 10 MG tablet TAKE 1 TABLET(10 MG) BY MOUTH DAILY 90 tablet 3   benzonatate (TESSALON) 100 MG capsule Take 1 capsule (100 mg total) by mouth 2 (two) times daily as needed for cough. 20 capsule 0   Canagliflozin-metFORMIN HCl ER (INVOKAMET XR) [phone] MG TB24 Take 2 tablets by mouth daily. 180 tablet 1   Continuous Blood Gluc Sensor (DEXCOM G6 SENSOR) MISC Apply per package instructions once every 10 days. 9 each 1   Continuous Blood Gluc Transmit (DEXCOM G6 TRANSMITTER) MISC Apply once per package instructions every 3 months. 1 each 1   diclofenac Sodium (VOLTAREN) 1 % GEL Apply 2 g topically 4 (four) times daily. 100 g 3   Dulaglutide 3 MG/0.5ML  SOPN Inject 3 mg into the skin once a week. 6 mL 0   glipiZIDE (GLUCOTROL) 5 MG tablet Take 1 tablet (5 mg total) by mouth 2 (two) times daily before a meal. 180 tablet 0   glucose blood (ONE TOUCH ULTRA TEST) test strip Use as instructed 100 each 12   hydrOXYzine (ATARAX/VISTARIL) 10 MG tablet Take 1-2 tablets (10-20 mg total) by mouth at bedtime as needed. 30 tablet 3   Lancets (ONETOUCH ULTRASOFT) lancets Use as instructed 100 each 12   lisinopril (ZESTRIL) 40 MG tablet Take 1 tablet (40 mg total) by mouth daily. 90 tablet 3   No facility-administered medications prior to visit.    Review of Systems  Constitutional: Negative.   HENT: Negative.    Eyes: Negative.   Respiratory: Negative.    Cardiovascular: Negative.   Gastrointestinal: Negative.   Genitourinary: Negative.   Musculoskeletal: Negative.   Skin: Negative.   Neurological: Negative.   Psychiatric/Behavioral: Negative.    All other systems reviewed and are negative.     Objective:     BP 124/70   Pulse 77   Temp 98.4 F (36.9 C) (Temporal)   Resp 18   Ht '5\' 2"'$  (1.575 m)   Wt 134 lb 3.2 oz (60.9 kg)   SpO2 99%   BMI 24.55 kg/m   Wt Readings from Last 3 Encounters:  10/08/21 134 lb 3.2 oz (60.9 kg)  07/01/21 131 lb 3.2 oz (  59.5 kg)  04/02/21 130 lb 9.6 oz (59.2 kg)   Physical Exam Constitutional:      General: He is not in acute distress.    Appearance: Normal appearance. He is normal weight. He is not ill-appearing, toxic-appearing or diaphoretic.  Cardiovascular:     Rate and Rhythm: Normal rate and regular rhythm.     Heart sounds: Normal heart sounds. No murmur heard.    No friction rub. No gallop.  Pulmonary:     Effort: Pulmonary effort is normal. No respiratory distress.     Breath sounds: Normal breath sounds. No stridor. No wheezing, rhonchi or rales.  Chest:     Chest wall: No tenderness.  Neurological:     General: No focal deficit present.     Mental Status: He is alert and oriented to  person, place, and time. Mental status is at baseline.  Psychiatric:        Mood and Affect: Mood normal.        Behavior: Behavior normal.        Thought Content: Thought content normal.        Judgment: Judgment normal.     Results for orders placed or performed in visit on 10/08/21  POCT glycosylated hemoglobin (Hb A1C)  Result Value Ref Range   Hemoglobin A1C 9.9 (A) 4.0 - 5.6 %   HbA1c POC (<> result, manual entry)     HbA1c, POC (prediabetic range)     HbA1c, POC (controlled diabetic range)         The ASCVD Risk score (Arnett DK, et al., 2019) failed to calculate for the following reasons:   The valid total cholesterol range is 130 to 320 mg/dL    Assessment & Plan:   Problem List Items Addressed This Visit       Endocrine   Type 2 diabetes mellitus ([place]) - Primary    Uncontrolled. Add glargine 14 units nightly. Recheck in 3 mo Advised to continue diet and exercise management.      Relevant Medications   insulin glargine (LANTUS) 100 UNIT/ML Solostar Pen   Other Relevant Orders   POCT glycosylated hemoglobin (Hb A1C) (Completed)   Other Visit Diagnoses     RUQ pain       Relevant Orders   US Abdomen Limited RUQ (LIVER/GB)   US THYROID   DG Chest 2 View   Rib pain on right side       Relevant Orders   US Abdomen Limited RUQ (LIVER/GB)   US THYROID   DG Chest 2 View       Meds ordered this encounter  Medications   insulin glargine (LANTUS) 100 UNIT/ML Solostar Pen    Sig: Inject 14 Units into the skin at bedtime.    Dispense:  15 mL    Refill:  1    Order Specific Question:   Supervising Provider    Answer:   Carlota Raspberry, JEFFREY R [[id number]]    No follow-ups on file.   PLAN See problem based charting. Will obtain cxr to follow up on past hemithorax, RUQ [place] for pain and follow up on steatosis/hemangioma, and US thyroid. Return in 3 mo, sooner with worsening or persistent symptoms Patient encouraged to call clinic with any questions, comments, or  concerns.   Maximiano Coss, NP

## 2021-10-08 NOTE — Assessment & Plan Note (Signed)
Uncontrolled. Add glargine 14 units nightly. Recheck in 3 mo Advised to continue diet and exercise management.

## 2021-10-08 NOTE — Patient Instructions (Addendum)
Mr. Mcaulay -  It's always great to see you  I'm sorry that your sugars are still high. We can start insulin every night before bed.   I want you to continue the other medications in the same manner that you are currently taking them.   I have ordered a chest xray. This can happen today - at [address], [facility]. They take walk ins.   I have ordered ultrasounds of the liver and thyroid. You will get a call to schedule these.  I'll see you in 3 months to recheck the sugars. Call sooner if your pain worsens or changes.  Thank you  Rich      Anh Bi -  Th?t tuy?t khi ???c g?p b?n  Ti xin l?i v l??ng ???ng c?a b?n v?n cn cao. Chng ta c th? b?t ??u dng insulin m?i t?i tr??c khi ?i ng?.  Ti mu?n b?n ti?p t?c dng cc lo?i thu?c khc theo cch m b?n hi?n ?ang dng.  Ti ? yu c?u ch?p xquang ng?c. ?i?u ny c th? x?y ra ngay hm nay - t?i [address], [facility]. H? ?i d?o.  Ti ? yu c?u siu m gan v tuy?n gip. B?n s? nh?n ???c m?t cu?c g?i ?? s?p x?p nh?ng ?i?u ny.  Ti s? g?p b?n sau 3 thng ?? ki?m tra l?i l??ng ???ng. G?i s?m h?n n?u c?n ?au c?a b?n tr? nn t?i t? h?n ho?c thay ??i.  C?m ?n   Rich  If you have lab work done today you will be contacted with your lab results within the next 2 weeks.  If you have not heard from [place] then please contact us. The fastest way to get your results is to register for My Chart.   IF you received an x-ray today, you will receive an invoice from [facility]. Please contact [facility] at [phone] with questions or concerns regarding your invoice.   IF you received labwork today, you will receive an invoice from [facility]. Please contact [facility] at [phone] with questions or concerns regarding your invoice.   Our billing staff will not be able to assist you with questions regarding bills from these companies.  You will be contacted with the lab results as soon as they are  available. The fastest way to get your results is to activate your My Chart account. Instructions are located on the last page of this paperwork. If you have not heard from [place] regarding the results in 2 weeks, please contact this office.

## 2021-10-08 NOTE — Addendum Note (Signed)
Addended by: Juliann Pulse on: 10/08/2021 02:08 PM   Modules accepted: Orders

## 2021-10-15 ENCOUNTER — Ambulatory Visit
Admission: RE | Admit: 2021-10-15 | Discharge: 2021-10-15 | Disposition: A | Discharge status: Home / Self Care | Payer: Medicare HMO | Source: Ambulatory Visit | Attending: Registered Nurse | Admitting: Registered Nurse

## 2021-10-15 DIAGNOSIS — R1011 Right upper quadrant pain: Secondary | ICD-10-CM

## 2021-10-15 DIAGNOSIS — E041 Nontoxic single thyroid nodule: Secondary | ICD-10-CM

## 2021-10-15 DIAGNOSIS — E042 Nontoxic multinodular goiter: Secondary | ICD-10-CM | POA: Diagnosis not present

## 2021-10-15 DIAGNOSIS — R0781 Pleurodynia: Secondary | ICD-10-CM

## 2021-10-23 ENCOUNTER — Ambulatory Visit
Admission: RE | Admit: 2021-10-23 | Discharge: 2021-10-23 | Disposition: A | Discharge status: Home / Self Care | Payer: Medicare HMO | Source: Ambulatory Visit | Attending: Registered Nurse | Admitting: Registered Nurse

## 2021-10-23 DIAGNOSIS — K760 Fatty (change of) liver, not elsewhere classified: Secondary | ICD-10-CM | POA: Diagnosis not present

## 2021-10-23 DIAGNOSIS — R0781 Pleurodynia: Secondary | ICD-10-CM

## 2021-10-23 DIAGNOSIS — R1011 Right upper quadrant pain: Secondary | ICD-10-CM

## 2022-01-07 ENCOUNTER — Ambulatory Visit: Payer: Medicare HMO | Admitting: Registered Nurse

## 2022-01-22 ENCOUNTER — Telehealth: Payer: Self-pay | Admitting: *Deleted

## 2022-01-22 NOTE — Patient Outreach (Signed)
  Care Coordination   01/22/2022 Name: Edward [name] MRN: [id number] DOB: [birth year]   Care Coordination Outreach Attempts:  An unsuccessful telephone outreach was attempted today to offer the patient information about available care coordination services as a benefit of their health plan.   Follow Up Plan:  Additional outreach attempts will be made to offer the patient care coordination information and services.   Encounter Outcome:  No Answer  Care Coordination Interventions Activated:  No   Care Coordination Interventions:  No, not indicated     Raina Mina, RN Care Management Coordinator [place] Office [phone]

## 2022-02-16 ENCOUNTER — Telehealth: Payer: Self-pay

## 2022-02-16 NOTE — Patient Outreach (Signed)
  Care Coordination   02/16/2022 Name: Edward [name] MRN: [id number] DOB: August 11, 1955   Care Coordination Outreach Attempts:  A second unsuccessful outreach was attempted today to offer the patient with information about available care coordination services as a benefit of their health plan.     Follow Up Plan:  Additional outreach attempts will be made to offer the patient care coordination information and services.   Encounter Outcome:  No Answer  Care Coordination Interventions Activated:  No   Care Coordination Interventions:  No, not indicated    Peter Garter RN, BSN,CCM, [place] [facility] [phone]

## 2022-02-24 ENCOUNTER — Telehealth: Payer: Self-pay

## 2022-02-24 NOTE — Patient Outreach (Signed)
  Care Coordination   02/24/2022 Name: AARYAN ESSMAN MRN: [id number] DOB: [birth year]   Care Coordination Outreach Attempts:  A third unsuccessful outreach was attempted today to offer the patient with information about available care coordination services as a benefit of their health plan.   Follow Up Plan:  No further outreach attempts will be made at this time. We have been unable to contact the patient to offer or enroll patient in care coordination services  Encounter Outcome:  No Answer  Care Coordination Interventions Activated:  No   Care Coordination Interventions:  No, not indicated   Peter Garter RN, BSN,CCM, [place] [facility] [phone]

## 2022-02-26 DIAGNOSIS — H2513 Age-related nuclear cataract, bilateral: Secondary | ICD-10-CM | POA: Diagnosis not present

## 2022-02-26 DIAGNOSIS — E119 Type 2 diabetes mellitus without complications: Secondary | ICD-10-CM | POA: Diagnosis not present

## 2022-02-26 DIAGNOSIS — H5203 Hypermetropia, bilateral: Secondary | ICD-10-CM | POA: Diagnosis not present

## 2022-03-04 ENCOUNTER — Other Ambulatory Visit (HOSPITAL_BASED_OUTPATIENT_CLINIC_OR_DEPARTMENT_OTHER): Payer: Self-pay

## 2022-03-04 ENCOUNTER — Ambulatory Visit (INDEPENDENT_AMBULATORY_CARE_PROVIDER_SITE_OTHER): Payer: Medicare HMO | Admitting: Family Medicine

## 2022-03-04 ENCOUNTER — Encounter (HOSPITAL_BASED_OUTPATIENT_CLINIC_OR_DEPARTMENT_OTHER): Payer: Self-pay | Admitting: Family Medicine

## 2022-03-04 DIAGNOSIS — E1140 Type 2 diabetes mellitus with diabetic neuropathy, unspecified: Secondary | ICD-10-CM

## 2022-03-04 DIAGNOSIS — I10 Essential (primary) hypertension: Secondary | ICD-10-CM | POA: Diagnosis not present

## 2022-03-04 MED ORDER — GLUCOSE BLOOD VI STRP: ORAL_STRIP | 12 refills | Status: AC

## 2022-03-04 MED ORDER — INVOKAMET XR 150-1000 MG PO TB24: 2.0000 | ORAL_TABLET | Freq: Every day | ORAL | 1 refills | Status: DC

## 2022-03-04 MED ORDER — AMLODIPINE BESYLATE 10 MG PO TABS: ORAL_TABLET | ORAL | 1 refills | Status: DC

## 2022-03-04 MED ORDER — LISINOPRIL 40 MG PO TABS: 40.0000 mg | ORAL_TABLET | Freq: Every day | ORAL | 1 refills | Status: DC

## 2022-03-04 MED ORDER — INSULIN GLARGINE 100 UNIT/ML SOLOSTAR PEN: 10.0000 [IU] | PEN_INJECTOR | Freq: Every day | SUBCUTANEOUS | 1 refills | Status: DC

## 2022-03-04 NOTE — Progress Notes (Signed)
New Patient Office Visit  Subjective    Patient ID: Edward [name], male    DOB: [birth year]  Age: 66 y.o. MRN: [id number]  CC:  Chief Complaint  Patient presents with   New Patient (Initial Visit)    Pt here to establish new care    HPI Edward [name] presents to establish care Last PCP - Maximiano Coss, last saw a few months ago Interpreter  DM: Patient with history of diabetes, review of chart indicates that he is currently being prescribed canagliflozin-metformin, glipizide, dulaglutide, Lantus.  In discussing with patient, he indicates that he is currently only taking canagliflozin-metformin.  He reports that the glipizide was dropping his blood sugar too low.  He also indicates that he did not feel well on dulaglutide and reports that it "was not working to control blood sugar".  Lantus was prescribed at most recent office visit about 4 months ago, however patient reports that he never started this medication.  He generally feels that just taking the 1 medication right now has allowed him to feel well overall.  His most recent hemoglobin A1c was about 4 months ago and was elevated at 9.9%  Thyroid nodule: Noted in the past incidentally on CT scan.  He did have follow-up ultrasound completed which did show multinodular goiter but without any significant changes and thus no further follow-up was indicated.  HTN: Currently taking amlodipine and lisinopril.  Does not check blood pressure at home regularly.  Denies any issues with chest pain or headaches.  Patient is originally from [place]. He has been living here for 13 years. 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  Outpatient Encounter Medications as of 03/04/2022  Medication Sig   albuterol (VENTOLIN HFA) 108 (90 Base) MCG/ACT inhaler Inhale 2 puffs into the lungs every 6 (six) hours as needed for wheezing or shortness of breath.   benzonatate (TESSALON) 100 MG capsule Take 1 capsule (100 mg  total) by mouth 2 (two) times daily as needed for cough.   Continuous Blood Gluc Sensor (DEXCOM G6 SENSOR) MISC Apply per package instructions once every 10 days.   Continuous Blood Gluc Transmit (DEXCOM G6 TRANSMITTER) MISC Apply once per package instructions every 3 months.   diclofenac Sodium (VOLTAREN) 1 % GEL Apply 2 g topically 4 (four) times daily.   hydrOXYzine (ATARAX/VISTARIL) 10 MG tablet Take 1-2 tablets (10-20 mg total) by mouth at bedtime as needed.   Lancets (ONETOUCH ULTRASOFT) lancets Use as instructed   [DISCONTINUED] amLODipine (NORVASC) 10 MG tablet TAKE 1 TABLET(10 MG) BY MOUTH DAILY   [DISCONTINUED] Canagliflozin-metFORMIN HCl ER (INVOKAMET XR) [phone] MG TB24 Take 2 tablets by mouth daily.   [DISCONTINUED] Dulaglutide 3 MG/0.5ML SOPN Inject 3 mg into the skin once a week.   [DISCONTINUED] glipiZIDE (GLUCOTROL) 5 MG tablet Take 1 tablet (5 mg total) by mouth 2 (two) times daily before a meal.   [DISCONTINUED] glucose blood (ONE TOUCH ULTRA TEST) test strip Use as instructed   [DISCONTINUED] insulin glargine (LANTUS) 100 UNIT/ML Solostar Pen Inject 14 Units into the skin at bedtime.   [DISCONTINUED] lisinopril (ZESTRIL) 40 MG tablet Take 1 tablet (40 mg total) by mouth daily.   amLODipine (NORVASC) 10 MG tablet TAKE 1 TABLET(10 MG) BY MOUTH DAILY   Canagliflozin-metFORMIN HCl ER (INVOKAMET XR) [phone] MG TB24 Take 2 tablets by mouth daily.   glucose blood (ONE TOUCH ULTRA TEST) test strip Use as instructed   insulin glargine (LANTUS) 100  UNIT/ML Solostar Pen Inject 10 Units into the skin at bedtime.   lisinopril (ZESTRIL) 40 MG tablet Take 1 tablet (40 mg total) by mouth daily.   No facility-administered encounter medications on file as of 03/04/2022.    Past Medical History:  Diagnosis Date   Aortic atherosclerosis ([place]) 10/25/2020   Diabetes mellitus without complication ([place])    Hypertension     Past Surgical History:  Procedure Laterality Date   TUMOR REMOVAL       Family History  Problem Relation Age of Onset   Prostate cancer Father    Heart attack Paternal Grandfather     Social History   Socioeconomic History   Marital status: Married    Spouse name: Not on file   Number of children: 2   Years of education: 16   Highest education level: Not on file  Occupational History   Occupation: Loss adjuster, chartered  Tobacco Use   Smoking status: Never   Smokeless tobacco: Never  Vaping Use   Vaping Use: Never used  Substance and Sexual Activity   Alcohol use: No    Alcohol/week: 0.0 standard drinks of alcohol   Drug use: No   Sexual activity: Not on file  Other Topics Concern   Not on file  Social History Narrative   Fun: Computers and playing games   Social Determinants of Health   Financial Resource Strain: Not on file  Food Insecurity: Not on file  Transportation Needs: Not on file  Physical Activity: Not on file  Stress: Not on file  Social Connections: Not on file  Intimate Partner Violence: Not on file    Objective    BP (!) 152/84   Pulse 83   Temp 97.9 F (36.6 C) (Oral)   Ht '5\' 2"'$  (1.575 m)   Wt 133 lb 3.2 oz (60.4 kg)   SpO2 100%   BMI 24.36 kg/m   Physical Exam  66 year old male in no acute distress Cardiovascular exam with regular rate and rhythm, no murmur appreciated Lungs clear to auscultation bilaterally  Assessment & Plan:   Problem List Items Addressed This Visit       Cardiovascular and Mediastinum   Essential hypertension    Blood pressure is elevated in the office today.  For now, we will continue with lisinopril and amlodipine.  Suspect that blood pressures are running slightly higher due to poorly controlled diabetes which can also lead to poorly controlled blood pressure readings Recommend intermittent monitoring of blood pressure at home, DASH diet. We will plan for close follow-up to monitor blood pressure      Relevant Medications   lisinopril (ZESTRIL) 40 MG tablet   amLODipine (NORVASC)  10 MG tablet     Endocrine   Type 2 diabetes mellitus ([place])    Long discussion with patient regarding medication management and that most recent hemoglobin A1c measurements have been above goal which indicates that blood sugars generally have been running high on average and this is associated with increased risk of numerous complications.  As a result, current medication regimen of only utilizing canagliflozin-metformin is not sufficient to appropriately manage blood sugars.  We did discuss potential options, also discussed that utilization of glipizide with insulin would generally not be recommended and if he was having issues with glipizide, would avoid continuing with this in the future.  Given degree of A1c elevation, recommend continuing with canagliflozin-metformin and adding insulin glargine at this time.  Would recommend starting with 10 units daily of insulin glargine  with regular monitoring of fasting blood sugar every morning.  Recommend close follow-up in about 2 weeks to monitor progress with insulin as well as review blood sugars and recent labs Patient is also due for check of urine microalbumin/creatinine ratio, we will check this today We will also need to complete diabetic foot exam and to ensure that patient is having retinopathy screening completed      Relevant Medications   Canagliflozin-metFORMIN HCl ER (INVOKAMET XR) [phone] MG TB24   lisinopril (ZESTRIL) 40 MG tablet   glucose blood (ONE TOUCH ULTRA TEST) test strip   insulin glargine (LANTUS) 100 UNIT/ML Solostar Pen   Other Relevant Orders   Urine Microalbumin w/creat. ratio (Completed)   Hemoglobin A1c (Completed)    Return in about 2 weeks (around 03/18/2022) for DM, HTN.   Michele Judy J De [place], MD

## 2022-03-04 NOTE — Patient Instructions (Signed)
  Medication Instructions:  Your physician recommends that you continue on your current medications as directed. Please refer to the Current Medication list given to you today. --If you need a refill on any your medications before your next appointment, please call your pharmacy first. If no refills are authorized on file call the office.-- Lab Work: Your physician has recommended that you have lab work today: Yes If you have labs (blood work) drawn today and your tests are completely normal, you will receive your results via [facility] a phone call from our staff.  Please ensure you check your voicemail in the event that you authorized detailed messages to be left on a delegated number. If you have any lab test that is abnormal or we need to change your treatment, we will call you to review the results.  Referrals/Procedures/Imaging: No  Follow-Up: Your next appointment:   Your physician recommends that you schedule a follow-up appointment in: 2-3 weeks with Dr. de [place].  You will receive a text message or e-mail with a link to a survey about your care and experience with [place] today! We would greatly appreciate your feedback!   Thanks for letting us be apart of your health journey!!  Primary Care and Sports Medicine   Dr. Arlina Robes [place]   We encourage you to activate your patient portal called "MyChart".  Sign up information is provided on this After Visit Summary.  MyChart is used to connect with patients for Virtual Visits (Telemedicine).  Patients are able to view lab/test results, encounter notes, upcoming appointments, etc.  Non-urgent messages can be sent to your provider as well. To learn more about what you can do with MyChart, please visit --  [email or web address].

## 2022-03-05 LAB — MICROALBUMIN / CREATININE URINE RATIO
Creatinine, Urine: 35 mg/dL
Microalb/Creat Ratio: 33 mg/g creat — ABNORMAL HIGH (ref 0–29)
Microalbumin, Urine: 11.7 ug/mL

## 2022-03-05 LAB — HEMOGLOBIN A1C
Est. average glucose Bld gHb Est-mCnc: 246 mg/dL
Hgb A1c MFr Bld: 10.2 % — ABNORMAL HIGH (ref 4.8–5.6)

## 2022-03-12 NOTE — Assessment & Plan Note (Addendum)
Long discussion with patient regarding medication management and that most recent hemoglobin A1c measurements have been above goal which indicates that blood sugars generally have been running high on average and this is associated with increased risk of numerous complications.  As a result, current medication regimen of only utilizing canagliflozin-metformin is not sufficient to appropriately manage blood sugars.  We did discuss potential options, also discussed that utilization of glipizide with insulin would generally not be recommended and if he was having issues with glipizide, would avoid continuing with this in the future.  Given degree of A1c elevation, recommend continuing with canagliflozin-metformin and adding insulin glargine at this time.  Would recommend starting with 10 units daily of insulin glargine with regular monitoring of fasting blood sugar every morning.  Recommend close follow-up in about 2 weeks to monitor progress with insulin as well as review blood sugars and recent labs Patient is also due for check of urine microalbumin/creatinine ratio, we will check this today We will also need to complete diabetic foot exam and to ensure that patient is having retinopathy screening completed

## 2022-03-12 NOTE — Assessment & Plan Note (Signed)
Blood pressure is elevated in the office today.  For now, we will continue with lisinopril and amlodipine.  Suspect that blood pressures are running slightly higher due to poorly controlled diabetes which can also lead to poorly controlled blood pressure readings Recommend intermittent monitoring of blood pressure at home, DASH diet. We will plan for close follow-up to monitor blood pressure

## 2022-03-29 ENCOUNTER — Telehealth (HOSPITAL_BASED_OUTPATIENT_CLINIC_OR_DEPARTMENT_OTHER): Payer: Self-pay

## 2022-03-29 NOTE — Telephone Encounter (Signed)
Attempted to call pt in regards to lab results. No answer so I left pt an vm.

## 2022-04-01 ENCOUNTER — Encounter (HOSPITAL_BASED_OUTPATIENT_CLINIC_OR_DEPARTMENT_OTHER): Payer: Self-pay

## 2022-04-05 NOTE — Progress Notes (Signed)
[name])                                            [name]                                        Statin Quality Measure Assessment    04/05/2022  Edward [name] February 20, 1956 [id number]  Per review of chart and payor information, this patient has been flagged for non-adherence to the following CMS Quality Measure:   '[x]'$  Statin Use in Persons with Diabetes  '[x]'$  Statin Use in Persons with Cardiovascular Disease  The ASCVD Risk score (Arnett DK, et al., 2019) failed to calculate for the following reasons:   The valid total cholesterol range is 130 to 320 mg/dL  This patient is failing SUPD/SPC CMS measure as a result of not being on a statin. Per chart review, no documented statin intolerance. Patient was previously on rosuvastatin but it was discontinued.  Next appointment is on 04/06/2022. If deemed clinically appropriate, please consider statin assessment for initiation or associate an exclusion code (see options below).    Please consider ONE of the following recommendations:   Initiate high intensity statin Atorvastatin '40mg'$  once daily, #90, 3 refills   Rosuvastatin '20mg'$  once daily, #90, 3 refills    Initiate moderate intensity          statin with reduced frequency if prior          statin intolerance 1x weekly, #13, 3 refills   2x weekly, #26, 3 refills   3x weekly, #39, 3 refills   Code for past statin intolerance or other exclusions (required annually)  Drug Induced Myopathy G72.0   Myositis, unspecified M60.9   Rhabdomyolysis M62.82   Cirrhosis of liver K74.69   Biliary cirrhosis, unspecified K74.5   Abnormal blood glucose - for SUPD ONLY R73.09   Thank you for your time,  Kristeen Miss, [place] Cell: [phone]

## 2022-04-06 ENCOUNTER — Ambulatory Visit (HOSPITAL_BASED_OUTPATIENT_CLINIC_OR_DEPARTMENT_OTHER): Payer: Medicare HMO | Admitting: Family Medicine

## 2022-04-21 ENCOUNTER — Encounter (HOSPITAL_BASED_OUTPATIENT_CLINIC_OR_DEPARTMENT_OTHER): Payer: Self-pay | Admitting: Family Medicine

## 2022-04-21 ENCOUNTER — Ambulatory Visit (INDEPENDENT_AMBULATORY_CARE_PROVIDER_SITE_OTHER): Payer: Medicare HMO | Admitting: Family Medicine

## 2022-04-21 ENCOUNTER — Other Ambulatory Visit (HOSPITAL_BASED_OUTPATIENT_CLINIC_OR_DEPARTMENT_OTHER): Payer: Self-pay

## 2022-04-21 VITALS — BP 151/76 | HR 90 | Ht 62.0 in | Wt 135.6 lb

## 2022-04-21 DIAGNOSIS — Z794 Long term (current) use of insulin: Secondary | ICD-10-CM | POA: Diagnosis not present

## 2022-04-21 DIAGNOSIS — E1140 Type 2 diabetes mellitus with diabetic neuropathy, unspecified: Secondary | ICD-10-CM

## 2022-04-21 DIAGNOSIS — I10 Essential (primary) hypertension: Secondary | ICD-10-CM

## 2022-04-21 MED ORDER — INSULIN GLARGINE (1 UNIT DIAL) 300 UNIT/ML ~~LOC~~ SOPN
10.0000 [IU] | PEN_INJECTOR | Freq: Every day | SUBCUTANEOUS | 1 refills | Status: DC
  Filled 2022-04-21: qty 3, 56d supply, fill #0

## 2022-04-21 NOTE — Progress Notes (Signed)
    Procedures performed today:    None.  Independent interpretation of notes and tests performed by another provider:   None.  Brief History, Exam, Impression, and Recommendations:    BP (!) 151/76   Pulse 90   Ht '5\' 2"'$  (1.575 m)   Wt 135 lb 9.6 oz (61.5 kg)   SpO2 100%   BMI 24.80 kg/m   Video interpreter - #[id number]  Type 2 diabetes mellitus ([place]) At last visit, patient was to be started on insulin glargine.  Unfortunately, he indicates that he went to pick up the medication and it was going to cost him about $80 and thus he could not afford this did not start medication.  He does report that he did start Trulicity that he had remaining at home that he was not taking previously.  He is unsure of dose for Trulicity that he has at home.  He has been having some dry mouth as well.  Not reporting any polyuria at present We reviewed prior labs including hemoglobin A1c being elevated above 10%.  Stressed importance of needing to control blood sugars better and that uncontrolled blood sugars can lead to increased risk of complications.  Additionally this can cause increased thirst and increased urination. Given that he is tolerating Trulicity currently, he can continue with what he has at home.  He will need to bring his medication into the office at next visit so we can review dose of medication that he is on We will attempt to send a different brand of insulin glargine to pharmacy to see if this may be covered better for him.  We will send this pharmacy downstairs here for patient to pick up and if there are any issues, we will work with pharmacy to identify the brand of insulin that will be covered ideally to allow for him to begin long-acting insulin.  We again reviewed fasting blood sugar measurements, need for checking fasting blood sugar every day and maintaining a log and bring this to next appointment so that we can optimally adjust insulin dose to better control blood sugars Foot exam  completed today, documented in chart  Essential hypertension Initial blood pressure reading elevated in office today.  Patient does not check his blood pressure regularly at home.  He continues with lisinopril and amlodipine, both medications are at maximal dose.  No current issues with chest pain or headaches Blood pressure did improve some on recheck. Do feel that some of the issues may be related to uncontrolled blood sugars and achieving better control of blood sugars should also lead to improvement in blood pressure readings Recommend intermittent monitoring of blood pressure at home, DASH diet  Return in about 3 weeks (around 05/12/2022) for DM, HTN.   ___________________________________________ Shayne Diguglielmo de [place], MD, ABFM, [facility] and [place]

## 2022-04-21 NOTE — Assessment & Plan Note (Signed)
At last visit, patient was to be started on insulin glargine.  Unfortunately, he indicates that he went to pick up the medication and it was going to cost him about $80 and thus he could not afford this did not start medication.  He does report that he did start Trulicity that he had remaining at home that he was not taking previously.  He is unsure of dose for Trulicity that he has at home.  He has been having some dry mouth as well.  Not reporting any polyuria at present We reviewed prior labs including hemoglobin A1c being elevated above 10%.  Stressed importance of needing to control blood sugars better and that uncontrolled blood sugars can lead to increased risk of complications.  Additionally this can cause increased thirst and increased urination. Given that he is tolerating Trulicity currently, he can continue with what he has at home.  He will need to bring his medication into the office at next visit so we can review dose of medication that he is on We will attempt to send a different brand of insulin glargine to pharmacy to see if this may be covered better for him.  We will send this pharmacy downstairs here for patient to pick up and if there are any issues, we will work with pharmacy to identify the brand of insulin that will be covered ideally to allow for him to begin long-acting insulin.  We again reviewed fasting blood sugar measurements, need for checking fasting blood sugar every day and maintaining a log and bring this to next appointment so that we can optimally adjust insulin dose to better control blood sugars Foot exam completed today, documented in chart

## 2022-04-21 NOTE — Assessment & Plan Note (Signed)
Initial blood pressure reading elevated in office today.  Patient does not check his blood pressure regularly at home.  He continues with lisinopril and amlodipine, both medications are at maximal dose.  No current issues with chest pain or headaches Blood pressure did improve some on recheck. Do feel that some of the issues may be related to uncontrolled blood sugars and achieving better control of blood sugars should also lead to improvement in blood pressure readings Recommend intermittent monitoring of blood pressure at home, DASH diet

## 2022-05-20 ENCOUNTER — Other Ambulatory Visit (HOSPITAL_BASED_OUTPATIENT_CLINIC_OR_DEPARTMENT_OTHER): Payer: Self-pay

## 2022-05-20 ENCOUNTER — Encounter (HOSPITAL_BASED_OUTPATIENT_CLINIC_OR_DEPARTMENT_OTHER): Payer: Self-pay | Admitting: Family Medicine

## 2022-05-20 ENCOUNTER — Ambulatory Visit (INDEPENDENT_AMBULATORY_CARE_PROVIDER_SITE_OTHER): Payer: Medicare HMO | Admitting: Family Medicine

## 2022-05-20 VITALS — BP 139/78 | HR 82 | Ht 62.0 in | Wt 134.4 lb

## 2022-05-20 DIAGNOSIS — E1140 Type 2 diabetes mellitus with diabetic neuropathy, unspecified: Secondary | ICD-10-CM

## 2022-05-20 DIAGNOSIS — I10 Essential (primary) hypertension: Secondary | ICD-10-CM | POA: Diagnosis not present

## 2022-05-20 DIAGNOSIS — Z794 Long term (current) use of insulin: Secondary | ICD-10-CM | POA: Diagnosis not present

## 2022-05-20 DIAGNOSIS — E1165 Type 2 diabetes mellitus with hyperglycemia: Secondary | ICD-10-CM

## 2022-05-20 MED ORDER — INVOKAMET XR 150-1000 MG PO TB24: 2.0000 | ORAL_TABLET | Freq: Every day | ORAL | 1 refills | Status: DC

## 2022-05-20 MED ORDER — INSULIN GLARGINE (1 UNIT DIAL) 300 UNIT/ML ~~LOC~~ SOPN
10.0000 [IU] | PEN_INJECTOR | Freq: Every day | SUBCUTANEOUS | 1 refills | Status: DC
  Filled 2022-05-20: qty 3, 90d supply, fill #0
  Filled 2022-05-20: qty 6, 112d supply, fill #0
  Filled 2022-06-07: qty 3, 90d supply, fill #0
  Filled 2022-07-20: qty 3, 56d supply, fill #0

## 2022-05-20 MED ORDER — LISINOPRIL 40 MG PO TABS: 40.0000 mg | ORAL_TABLET | Freq: Every day | ORAL | 1 refills | Status: DC

## 2022-05-20 MED ORDER — INSULIN GLARGINE (1 UNIT DIAL) 300 UNIT/ML ~~LOC~~ SOPN
10.0000 [IU] | PEN_INJECTOR | Freq: Every day | SUBCUTANEOUS | 1 refills | Status: DC
  Filled 2022-05-20: qty 3, 56d supply, fill #0

## 2022-05-20 MED ORDER — ATORVASTATIN CALCIUM 10 MG PO TABS
10.0000 mg | ORAL_TABLET | Freq: Every day | ORAL | 3 refills | Status: DC
  Filled 2022-05-20: qty 90, 90d supply, fill #0

## 2022-05-20 MED ORDER — AMLODIPINE BESYLATE 10 MG PO TABS: ORAL_TABLET | ORAL | 1 refills | Status: DC

## 2022-05-20 NOTE — Assessment & Plan Note (Signed)
Blood pressure borderline in office today on initial reading, did improve on recheck.  He continues with amlodipine and lisinopril, denies any issues with medications at this time Has not been checking blood pressure at home regularly. No changes to be made to medication regimen today.  Recommend intermittent monitoring of blood pressure at home, DASH diet

## 2022-05-20 NOTE — Assessment & Plan Note (Signed)
Patient brings blood sugar log in today for review.  He was able to start with insulin glargine, reports that he has been doing well with this.  Blood sugar log does show improving control over last few weeks with most recent blood sugars ranging from 100-130.  He has not had any low blood sugar readings or symptoms.  Does indicate that sometimes if he has not eaten for a while, he will feel some discomfort and need to eat he did bring dose of Trulicity that he was on in the past which prescription shows 3 mg.  He reports that he has not been taking this medication for at least 2 months now.  He additionally continues with canagliflozin-metformin.  Generally, patient has been doing well. Given timing of last labs, it would be too soon to recheck hemoglobin A1c at this time.  Prior labs also showed slightly elevated urine microalbumin/creatinine ratio We will plan to recheck hemoglobin A1c and urine microalbumin/creatinine ratio at time of next office visit in about 2 months

## 2022-05-20 NOTE — Progress Notes (Signed)
    Procedures performed today:    None.  Independent interpretation of notes and tests performed by another provider:   None.  Brief History, Exam, Impression, and Recommendations:    BP 139/78   Pulse 82   Ht '5\' 2"'$  (1.575 m)   Wt 134 lb 6.4 oz (61 kg)   SpO2 99%   BMI 24.58 kg/m   In-person interpreter available and utilized for interpretation  Type 2 diabetes mellitus ([place]) Patient brings blood sugar log in today for review.  He was able to start with insulin glargine, reports that he has been doing well with this.  Blood sugar log does show improving control over last few weeks with most recent blood sugars ranging from 100-130.  He has not had any low blood sugar readings or symptoms.  Does indicate that sometimes if he has not eaten for a while, he will feel some discomfort and need to eat he did bring dose of Trulicity that he was on in the past which prescription shows 3 mg.  He reports that he has not been taking this medication for at least 2 months now.  He additionally continues with canagliflozin-metformin.  Generally, patient has been doing well. Given timing of last labs, it would be too soon to recheck hemoglobin A1c at this time.  Prior labs also showed slightly elevated urine microalbumin/creatinine ratio We will plan to recheck hemoglobin A1c and urine microalbumin/creatinine ratio at time of next office visit in about 2 months  Essential hypertension Blood pressure borderline in office today on initial reading, did improve on recheck.  He continues with amlodipine and lisinopril, denies any issues with medications at this time Has not been checking blood pressure at home regularly. No changes to be made to medication regimen today.  Recommend intermittent monitoring of blood pressure at home, DASH diet  Given underlying history of diabetes, discussed recommendation for initiating statin therapy, patient voices understanding and agreement.  We will proceed with  atorvastatin at this time and monitor for any adverse effects  Return in about 2 months (around 07/19/2022).   ___________________________________________ Kathi Dohn de [place], MD, ABFM, [facility] and [place]

## 2022-06-07 ENCOUNTER — Other Ambulatory Visit (HOSPITAL_BASED_OUTPATIENT_CLINIC_OR_DEPARTMENT_OTHER): Payer: Self-pay

## 2022-06-07 ENCOUNTER — Other Ambulatory Visit: Payer: Self-pay

## 2022-06-16 ENCOUNTER — Other Ambulatory Visit (HOSPITAL_BASED_OUTPATIENT_CLINIC_OR_DEPARTMENT_OTHER): Payer: Self-pay

## 2022-06-29 ENCOUNTER — Telehealth (HOSPITAL_BASED_OUTPATIENT_CLINIC_OR_DEPARTMENT_OTHER): Payer: Self-pay | Admitting: Family Medicine

## 2022-06-29 NOTE — Telephone Encounter (Signed)
Called patient to schedule Medicare Annual Wellness Visit (AWV). Left message for patient to call back and schedule Medicare Annual Wellness Visit (AWV).  Last date of AWV: AWVI eligible as of 04/02/2021  Please schedule an appointment at any time with [place], [facility].  If any questions, please contact me at [phone].    Thank you,  [place] Direct dial  [phone]

## 2022-07-20 ENCOUNTER — Ambulatory Visit (HOSPITAL_BASED_OUTPATIENT_CLINIC_OR_DEPARTMENT_OTHER): Payer: Medicare HMO | Admitting: Family Medicine

## 2022-07-20 ENCOUNTER — Other Ambulatory Visit (HOSPITAL_BASED_OUTPATIENT_CLINIC_OR_DEPARTMENT_OTHER): Payer: Self-pay

## 2022-07-22 ENCOUNTER — Ambulatory Visit (INDEPENDENT_AMBULATORY_CARE_PROVIDER_SITE_OTHER): Payer: Medicare HMO | Admitting: Family Medicine

## 2022-07-22 ENCOUNTER — Other Ambulatory Visit (HOSPITAL_BASED_OUTPATIENT_CLINIC_OR_DEPARTMENT_OTHER): Payer: Self-pay

## 2022-07-22 ENCOUNTER — Encounter (HOSPITAL_BASED_OUTPATIENT_CLINIC_OR_DEPARTMENT_OTHER): Payer: Self-pay | Admitting: Family Medicine

## 2022-07-22 VITALS — BP 153/85 | HR 92 | Ht 62.0 in | Wt 130.2 lb

## 2022-07-22 DIAGNOSIS — E1165 Type 2 diabetes mellitus with hyperglycemia: Secondary | ICD-10-CM | POA: Diagnosis not present

## 2022-07-22 DIAGNOSIS — Z794 Long term (current) use of insulin: Secondary | ICD-10-CM

## 2022-07-22 DIAGNOSIS — I10 Essential (primary) hypertension: Secondary | ICD-10-CM

## 2022-07-22 DIAGNOSIS — E1140 Type 2 diabetes mellitus with diabetic neuropathy, unspecified: Secondary | ICD-10-CM

## 2022-07-22 MED ORDER — INSULIN GLARGINE (1 UNIT DIAL) 300 UNIT/ML ~~LOC~~ SOPN: 10.0000 [IU] | PEN_INJECTOR | Freq: Every day | SUBCUTANEOUS | 2 refills | Status: DC

## 2022-07-22 NOTE — Assessment & Plan Note (Signed)
Patient continues with insulin glargine 10 units daily as well as canagliflozin-metformin.  Denies any issues with medications at this time.  Denies any reported hypoglycemia episodes or readings.  He does check his blood sugar first thing in the morning, reports that readings have been in the low 100s.  Has not had any polyuria or polydipsia.  Most recent hemoglobin A1c was notably above target, due for recheck at this time Will proceed with checking hemoglobin A1c today as well as urine microalbumin/creatinine ratio as this was slightly elevated recently Recommend continue with current medications, we will proceed with changes as indicated by A1c

## 2022-07-22 NOTE — Assessment & Plan Note (Signed)
Blood pressure elevated in office today, slightly improved on recheck, patient infrequently checks blood pressure at home.  He continues with amlodipine and lisinopril, denies any issues with his medications. For now, we can continue with current medication regimen, will need to continue to monitor to assess for need to adjust pharmacotherapy Recommend intermittent monitoring of blood pressure at home, DASH diet

## 2022-07-22 NOTE — Progress Notes (Signed)
    Procedures performed today:    None.  Independent interpretation of notes and tests performed by another provider:   None.  Brief History, Exam, Impression, and Recommendations:    BP (!) 153/85 (BP Location: Left Arm, Patient Position: Sitting, Cuff Size: Normal)   Pulse 92   Ht 5\' 2"  (1.575 m)   Wt 130 lb 3.2 oz (59.1 kg)   SpO2 99%   BMI 23.81 kg/m   Type 2 diabetes mellitus ([place]) Patient continues with insulin glargine 10 units daily as well as canagliflozin-metformin.  Denies any issues with medications at this time.  Denies any reported hypoglycemia episodes or readings.  He does check his blood sugar first thing in the morning, reports that readings have been in the low 100s.  Has not had any polyuria or polydipsia.  Most recent hemoglobin A1c was notably above target, due for recheck at this time Will proceed with checking hemoglobin A1c today as well as urine microalbumin/creatinine ratio as this was slightly elevated recently Recommend continue with current medications, we will proceed with changes as indicated by A1c  Essential hypertension Blood pressure elevated in office today, slightly improved on recheck, patient infrequently checks blood pressure at home.  He continues with amlodipine and lisinopril, denies any issues with his medications. For now, we can continue with current medication regimen, will need to continue to monitor to assess for need to adjust pharmacotherapy Recommend intermittent monitoring of blood pressure at home, DASH diet  Return in about 2 months (around 09/21/2022) for DM, HTN. Patient will be leaving the country in May, he will return to the office prior to his trip significant follow-up on chronic medical conditions and review any changes needed at that time   ___________________________________________ Candida Vetter de [place], MD, ABFM, [facility] and [place]

## 2022-07-25 LAB — HEMOGLOBIN A1C
Est. average glucose Bld gHb Est-mCnc: 180 mg/dL
Hgb A1c MFr Bld: 7.9 % — ABNORMAL HIGH (ref 4.8–5.6)

## 2022-07-25 LAB — BASIC METABOLIC PANEL
BUN/Creatinine Ratio: 19 (ref 10–24)
BUN: 12 mg/dL (ref 8–27)
CO2: 25 mmol/L (ref 20–29)
Calcium: 9.1 mg/dL (ref 8.6–10.2)
Chloride: 101 mmol/L (ref 96–106)
Creatinine, Ser: 0.62 mg/dL — ABNORMAL LOW (ref 0.76–1.27)
Glucose: 102 mg/dL — ABNORMAL HIGH (ref 70–99)
Potassium: 3.9 mmol/L (ref 3.5–5.2)
Sodium: 142 mmol/L (ref 134–144)
eGFR: 105 mL/min/{1.73_m2} (ref >59)

## 2022-07-25 LAB — MICROALBUMIN / CREATININE URINE RATIO
Creatinine, Urine: 54.8 mg/dL
Microalb/Creat Ratio: 67 mg/g creat — ABNORMAL HIGH (ref 0–29)
Microalbumin, Urine: 36.7 ug/mL

## 2022-08-26 ENCOUNTER — Telehealth (HOSPITAL_BASED_OUTPATIENT_CLINIC_OR_DEPARTMENT_OTHER): Payer: Self-pay | Admitting: Family Medicine

## 2022-08-26 NOTE — Telephone Encounter (Signed)
Called patient to schedule Medicare Annual Wellness Visit (AWV). No voicemail available to leave a message.  Last date of AWV: DUE 04/02/2021 AWVI PER [place]  Please schedule an appointment at any time with [place], [facility]. .  If any questions, please contact me at [phone].  Thank you,  Judeth Cornfield,  AMB Clinical Support [facility] AWV Program Direct Dial ??[phone]

## 2022-09-13 ENCOUNTER — Ambulatory Visit (HOSPITAL_BASED_OUTPATIENT_CLINIC_OR_DEPARTMENT_OTHER): Payer: Medicare HMO | Admitting: Family Medicine

## 2022-09-21 ENCOUNTER — Encounter (HOSPITAL_BASED_OUTPATIENT_CLINIC_OR_DEPARTMENT_OTHER): Payer: Medicare HMO

## 2022-11-14 IMAGING — CT CT CHEST W/ CM
2 of 4 series · 15 of 36 positions shown, 18 images · IV contrast (omnipaque)
Comparison: Chest radiograph dated 10/22/2020 and CT dated
10/21/2020

CLINICAL DATA: 64-year-old male with pleural effusion. Concern for
malignancy.

EXAM:
CT CHEST WITH CONTRAST
TECHNIQUE: Multidetector CT imaging of the chest was performed during
intravenous contrast administration.
CONTRAST:  75mL OMNIPAQUE IOHEXOL 300 MG/ML  SOLN

[Series 2: axial st · axial · 0.65mm/px · z∈[-99,+165]mm · 12 of 158 slices shown, 15 images]
[im 13/158  mediastinal]
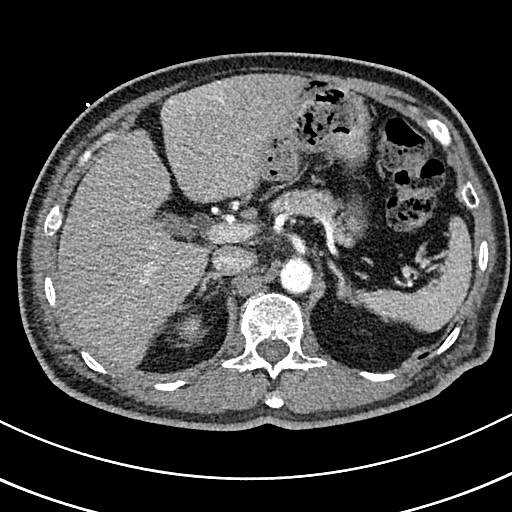
[im 13/158  lung]
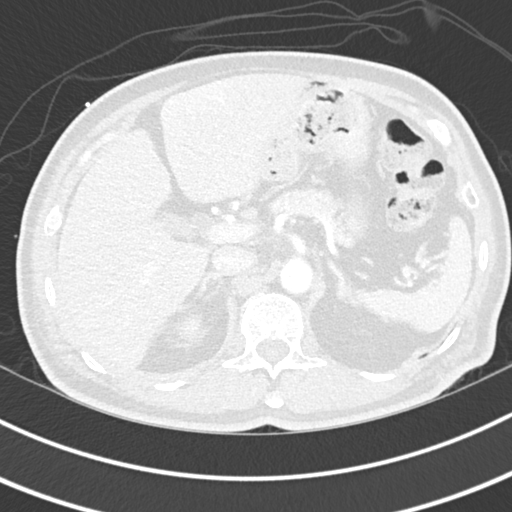
[im 25/158  lung]
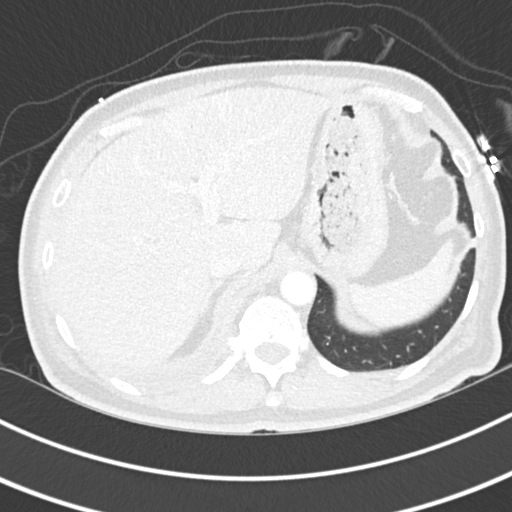
[im 37/158  lung]
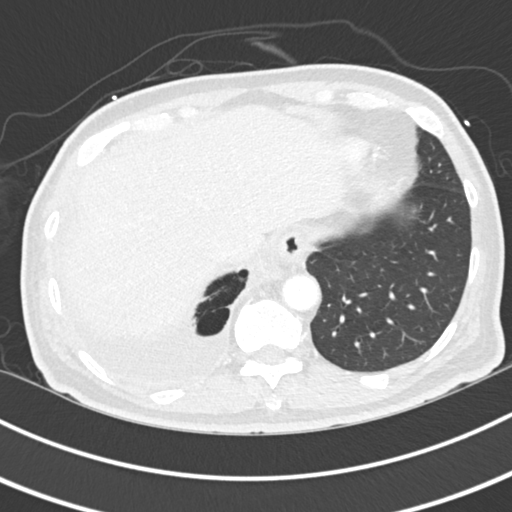
[im 49/158  lung]
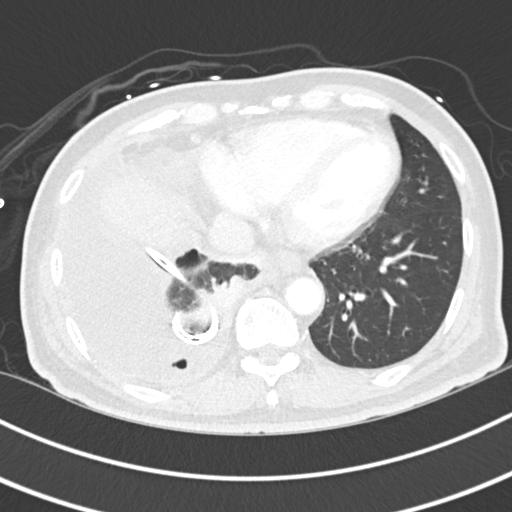
[im 61/158  mediastinal]
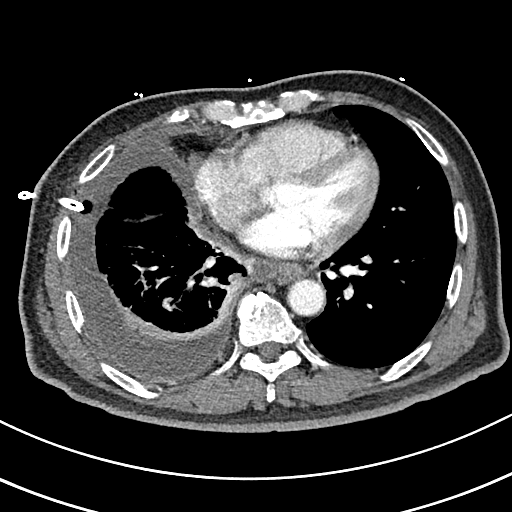
[im 61/158  lung]
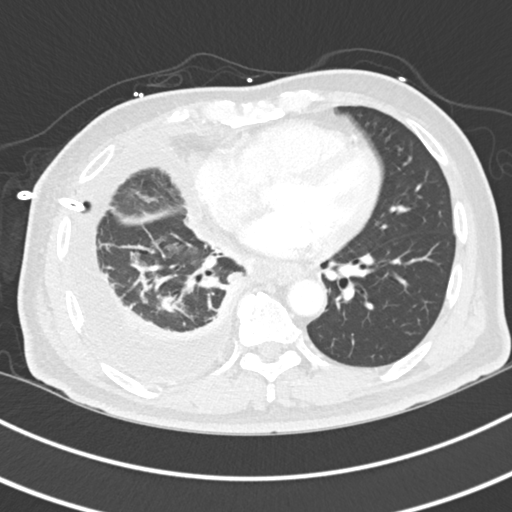
[im 73/158  lung]
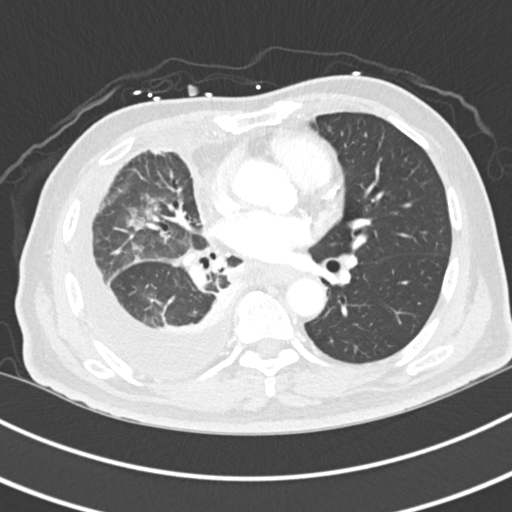
[im 85/158  lung]
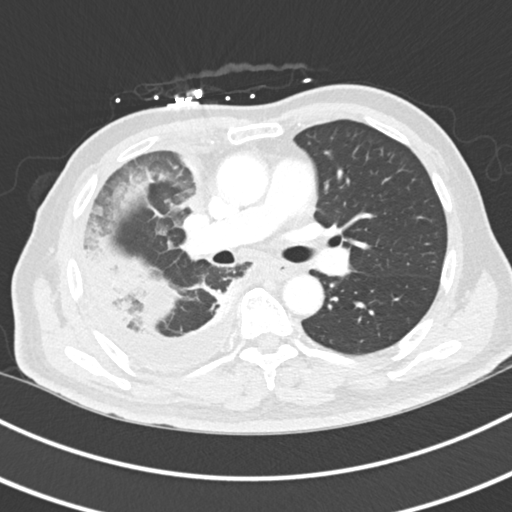
[im 97/158  lung]
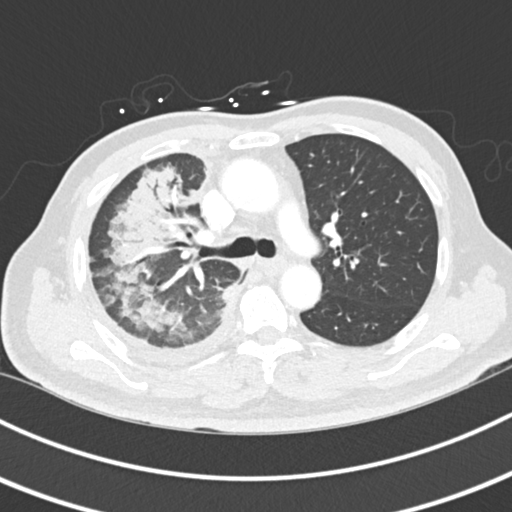
[im 109/158  mediastinal]
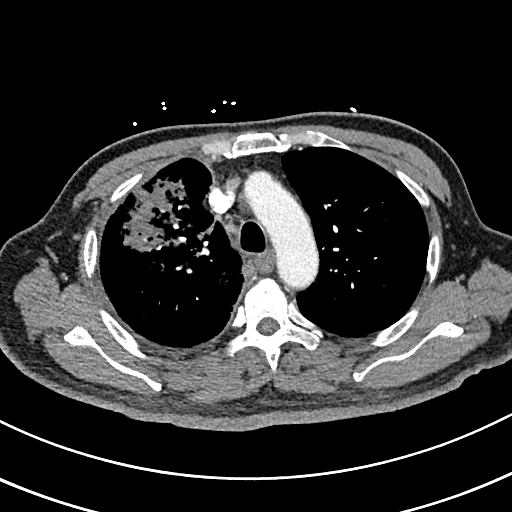
[im 109/158  lung]
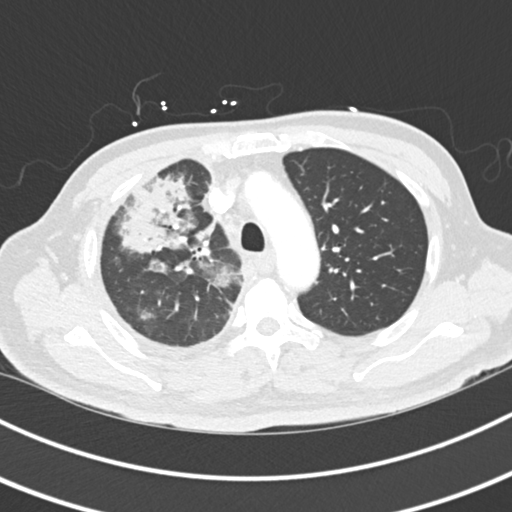
[im 121/158  lung]
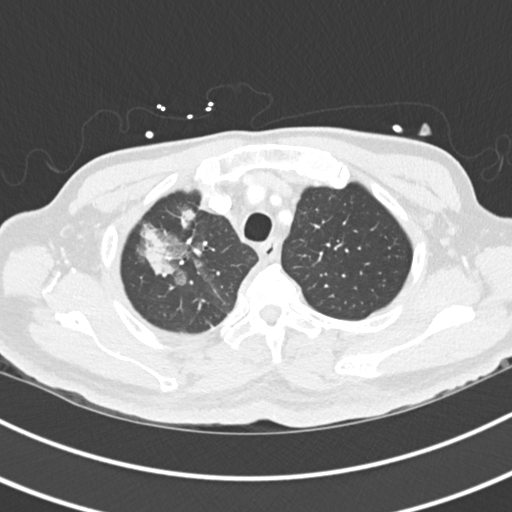
[im 133/158  lung]
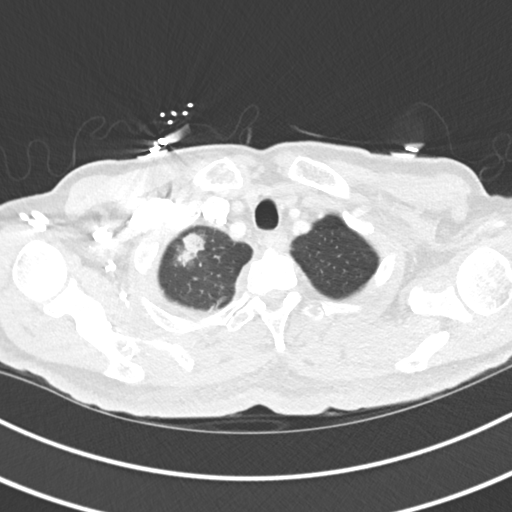
[im 145/158  lung]
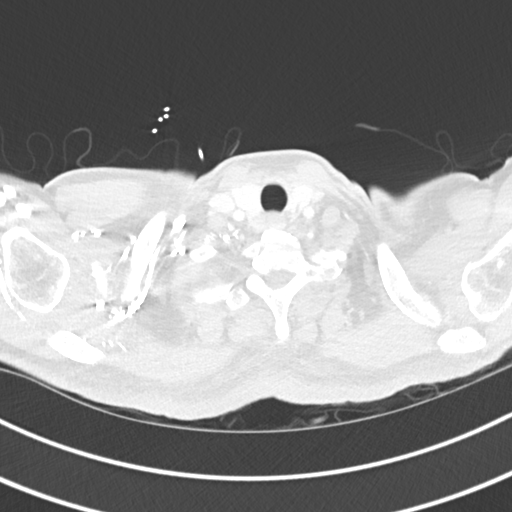

[Series 6: coronal · coronal · 0.65mm/px · 3 of 128 slices shown]
[im 26/128  lung]
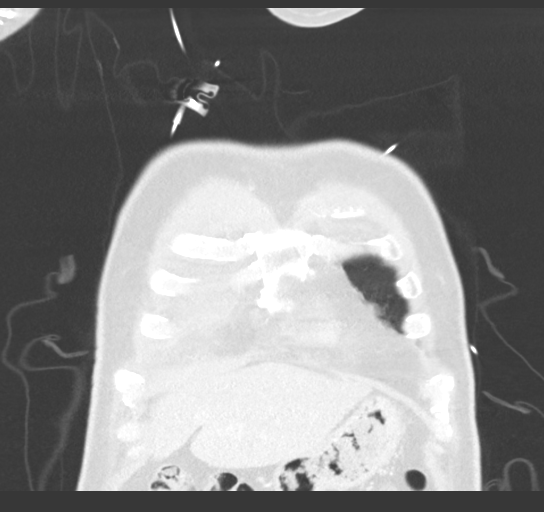
[im 51/128  lung]
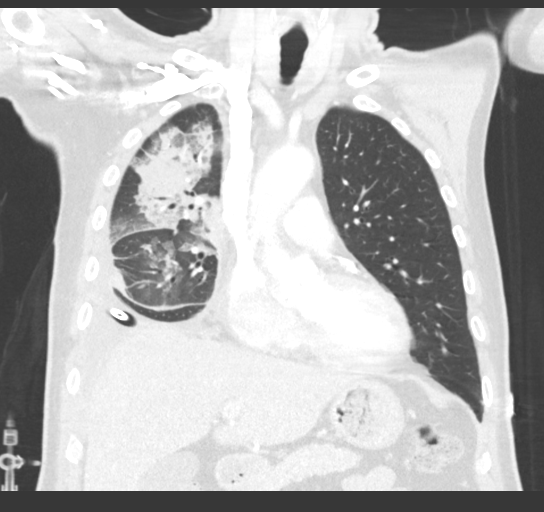
[im 77/128  lung]
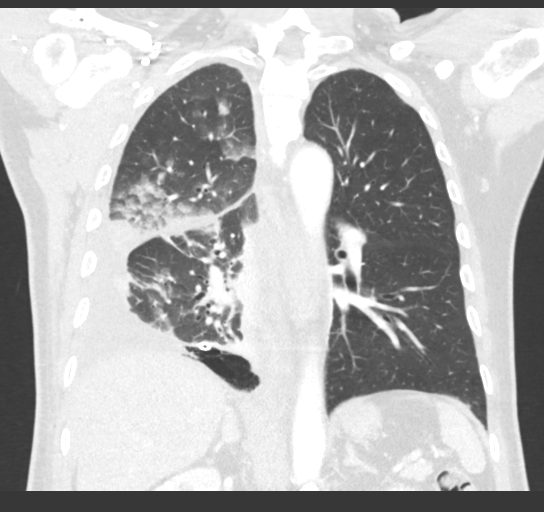

[15 of 36 positions shown; findings below may reference images not displayed]

FINDINGS: Cardiovascular: There is no cardiomegaly or pericardial effusion.
Three-vessel coronary vascular calcification. Mild atherosclerotic
calcification of the aortic arch. No aneurysmal dilatation or
dissection. The origins of the great vessels of the arch appear
patent as visualized. No pulmonary artery embolus identified.

Mediastinum/Nodes: No hilar or mediastinal adenopathy. The esophagus
is grossly unremarkable. A 13 mm left thyroid hypodense nodule as
seen on the prior CT. No mediastinal fluid collection.

Lungs/Pleura: Interval placement of a right-sided chest tube with
tip along the inferior right lower lobe pleura. There has been
significant interval decrease in the size of the right pleural
effusion compared to the prior CT with significant re-expansion of
the right lung. Mild thickened appearance of the right pleural
surface may represent in plana. Large areas of airspace opacity
primarily in the right upper lobe may represent atelectasis or
infiltrate. Follow-up to resolution recommended. The left lung is
clear. There is no pneumothorax. The central airways are patent.

Upper Abdomen: A 2 cm enhancing focus in the left lobe of the liver,
likely a flash filling hemangioma or vascular shunting.

Musculoskeletal: Degenerative changes of the spine. No acute osseous
pathology.
IMPRESSION: 1. Interval placement of a right-sided chest tube with significant
interval decrease in the size of the right pleural effusion and
re-expansion of the right lung since the prior CT.
2. Large areas of airspace opacity primarily in the right upper lobe
may represent atelectasis or infiltrate. Follow-up to resolution
recommended.
3. Aortic Atherosclerosis ([id number]).

## 2022-11-17 ENCOUNTER — Encounter (HOSPITAL_BASED_OUTPATIENT_CLINIC_OR_DEPARTMENT_OTHER): Payer: Self-pay | Admitting: *Deleted

## 2022-12-03 ENCOUNTER — Encounter (HOSPITAL_BASED_OUTPATIENT_CLINIC_OR_DEPARTMENT_OTHER): Payer: Self-pay

## 2023-02-24 ENCOUNTER — Encounter (HOSPITAL_BASED_OUTPATIENT_CLINIC_OR_DEPARTMENT_OTHER): Payer: Self-pay | Admitting: *Deleted

## 2023-02-28 ENCOUNTER — Ambulatory Visit (HOSPITAL_BASED_OUTPATIENT_CLINIC_OR_DEPARTMENT_OTHER): Payer: Medicare HMO | Admitting: Family Medicine

## 2023-03-03 ENCOUNTER — Other Ambulatory Visit: Payer: Self-pay

## 2023-03-03 ENCOUNTER — Encounter (HOSPITAL_BASED_OUTPATIENT_CLINIC_OR_DEPARTMENT_OTHER): Payer: Self-pay | Admitting: Family Medicine

## 2023-03-03 ENCOUNTER — Ambulatory Visit (INDEPENDENT_AMBULATORY_CARE_PROVIDER_SITE_OTHER): Payer: Medicare HMO | Admitting: Family Medicine

## 2023-03-03 ENCOUNTER — Other Ambulatory Visit (HOSPITAL_BASED_OUTPATIENT_CLINIC_OR_DEPARTMENT_OTHER): Payer: Self-pay

## 2023-03-03 VITALS — BP 138/77 | HR 71 | Ht 65.0 in | Wt 130.0 lb

## 2023-03-03 DIAGNOSIS — I10 Essential (primary) hypertension: Secondary | ICD-10-CM

## 2023-03-03 DIAGNOSIS — Z794 Long term (current) use of insulin: Secondary | ICD-10-CM | POA: Diagnosis not present

## 2023-03-03 DIAGNOSIS — E1165 Type 2 diabetes mellitus with hyperglycemia: Secondary | ICD-10-CM | POA: Diagnosis not present

## 2023-03-03 LAB — POCT GLYCOSYLATED HEMOGLOBIN (HGB A1C): Hemoglobin A1C: 8.8 % — AB (ref 4.0–5.6)

## 2023-03-03 MED ORDER — ATORVASTATIN CALCIUM 10 MG PO TABS
10.0000 mg | ORAL_TABLET | Freq: Every day | ORAL | 3 refills | Status: DC
  Filled 2023-03-03 (×2): qty 90, 90d supply, fill #0

## 2023-03-03 NOTE — Assessment & Plan Note (Signed)
Blood pressure elevated in office today, systolic elevated, diastolic at goal, patient infrequently checks blood pressure at home.  He continues with amlodipine and lisinopril, denies any issues with his medications. For now, we can continue with current medication regimen, will need to continue to monitor to assess for need to adjust pharmacotherapy Recommend intermittent monitoring of blood pressure at home, DASH diet We will plan for close follow-up in about 2 months for monitoring of blood pressure and to determine if any changes to medication would be needed.  Given current medication regimen, likely would initiate thiazide like diuretic such as chlorthalidone or indapamide if needing to make medication adjustment

## 2023-03-03 NOTE — Assessment & Plan Note (Addendum)
Patient continues with insulin glargine 10 units daily as well as canagliflozin-metformin.  Denies any issues with medications at this time.  Denies any reported hypoglycemia episodes or readings.  He does infrequently check his blood sugar first thing in the morning, reports checking about once weekly and numbers generally being around 160.  Has not had any polyuria or polydipsia.  Most recent hemoglobin A1c was above target, however had shown some improvement from prior readings, due for recheck at this time Will proceed with checking hemoglobin A1c today Recommend continue with current medications, may need to consider adjustment in insulin dose based on A1c, however some difficulty with this given infrequent checks of blood sugar.  Advised on checking blood sugar each morning to obtain fasting result in order to better determine any changes needed with medication management Received hemoglobin A1c result, found to be elevated 8.8%.  Given this, recommend increasing dose of insulin by 2 units.  As discussed in office, recommend monitoring fasting blood sugar closely, checking this daily

## 2023-03-03 NOTE — Progress Notes (Signed)
    Procedures performed today:    None.  Independent interpretation of notes and tests performed by another provider:   None.  Brief History, Exam, Impression, and Recommendations:    BP 138/77   Pulse 71   Ht 5\' 5"  (1.651 m)   Wt 130 lb (59 kg)   SpO2 100%   BMI 21.63 kg/m   Essential hypertension Assessment & Plan: Blood pressure elevated in office today, systolic elevated, diastolic at goal, patient infrequently checks blood pressure at home.  He continues with amlodipine and lisinopril, denies any issues with his medications. For now, we can continue with current medication regimen, will need to continue to monitor to assess for need to adjust pharmacotherapy Recommend intermittent monitoring of blood pressure at home, DASH diet We will plan for close follow-up in about 2 months for monitoring of blood pressure and to determine if any changes to medication would be needed.  Given current medication regimen, likely would initiate thiazide like diuretic such as chlorthalidone or indapamide if needing to make medication adjustment   Type 2 diabetes mellitus with hyperglycemia, with long-term current use of insulin (HCC) Assessment & Plan: Patient continues with insulin glargine 10 units daily as well as canagliflozin-metformin.  Denies any issues with medications at this time.  Denies any reported hypoglycemia episodes or readings.  He does infrequently check his blood sugar first thing in the morning, reports checking about once weekly and numbers generally being around 160.  Has not had any polyuria or polydipsia.  Most recent hemoglobin A1c was above target, however had shown some improvement from prior readings, due for recheck at this time Will proceed with checking hemoglobin A1c today Recommend continue with current medications, may need to consider adjustment in insulin dose based on A1c, however some difficulty with this given infrequent checks of blood sugar.  Advised on  checking blood sugar each morning to obtain fasting result in order to better determine any changes needed with medication management Received hemoglobin A1c result, found to be elevated 8.8%.  Given this, recommend increasing dose of insulin by 2 units.  As discussed in office, recommend monitoring fasting blood sugar closely, checking this daily  Orders: -     Atorvastatin Calcium; Take 1 tablet (10 mg total) by mouth daily.  Dispense: 90 tablet; Refill: 3 -     POCT glycosylated hemoglobin (Hb A1C)  Return in about 2 months (around 05/03/2023) for hypertension, diabetes.   ___________________________________________ Mellina Benison de [place], MD, ABFM, [facility] Primary [facility] and Sports Medicine [facility]

## 2023-03-03 NOTE — Patient Instructions (Signed)
  Medication Instructions:  Your physician recommends that you continue on your current medications as directed. Please refer to the Current Medication list given to you today. --If you need a refill on any your medications before your next appointment, please call your pharmacy first. If no refills are authorized on file call the office.-- Lab Work: Your physician has recommended that you have lab work today: yes If you have labs (blood work) drawn today and your tests are completely normal, you will receive your results via MyChart message OR a phone call from our staff.  Please ensure you check your voicemail in the event that you authorized detailed messages to be left on a delegated number. If you have any lab test that is abnormal or we need to change your treatment, we will call you to review the results.  Referrals/Procedures/Imaging: no  Follow-Up: Your next appointment:   Your physician recommends that you schedule a follow-up appointment in: 2 months with Dr. de [place]  You will receive a text message or e-mail with a link to a survey about your care and experience with [place] today! We would greatly appreciate your feedback!   Thanks for letting us be apart of your health journey!!  Primary Care and Sports Medicine   Dr. Ceasar Mons [place]   We encourage you to activate your patient portal called "MyChart".  Sign up information is provided on this After Visit Summary.  MyChart is used to connect with patients for Virtual Visits (Telemedicine).  Patients are able to view lab/test results, encounter notes, upcoming appointments, etc.  Non-urgent messages can be sent to your provider as well. To learn more about what you can do with MyChart, please visit --  [email or web address].

## 2023-03-04 ENCOUNTER — Other Ambulatory Visit (HOSPITAL_BASED_OUTPATIENT_CLINIC_OR_DEPARTMENT_OTHER): Payer: Self-pay

## 2023-03-11 DIAGNOSIS — H5203 Hypermetropia, bilateral: Secondary | ICD-10-CM | POA: Diagnosis not present

## 2023-03-11 DIAGNOSIS — H2513 Age-related nuclear cataract, bilateral: Secondary | ICD-10-CM | POA: Diagnosis not present

## 2023-03-11 DIAGNOSIS — E113292 Type 2 diabetes mellitus with mild nonproliferative diabetic retinopathy without macular edema, left eye: Secondary | ICD-10-CM | POA: Diagnosis not present

## 2023-03-11 LAB — HM DIABETES EYE EXAM

## 2023-05-03 ENCOUNTER — Ambulatory Visit (INDEPENDENT_AMBULATORY_CARE_PROVIDER_SITE_OTHER): Payer: Medicare HMO | Admitting: Family Medicine

## 2023-05-03 ENCOUNTER — Encounter (HOSPITAL_BASED_OUTPATIENT_CLINIC_OR_DEPARTMENT_OTHER): Payer: Self-pay | Admitting: Family Medicine

## 2023-05-03 VITALS — BP 134/82 | HR 93 | Ht 65.0 in | Wt 132.9 lb

## 2023-05-03 DIAGNOSIS — E1165 Type 2 diabetes mellitus with hyperglycemia: Secondary | ICD-10-CM | POA: Diagnosis not present

## 2023-05-03 DIAGNOSIS — Z794 Long term (current) use of insulin: Secondary | ICD-10-CM

## 2023-05-03 DIAGNOSIS — I10 Essential (primary) hypertension: Secondary | ICD-10-CM | POA: Diagnosis not present

## 2023-05-03 LAB — POCT GLUCOSE (DEVICE FOR HOME USE): POC Glucose: 129 mg/dL — AB (ref 70–99)

## 2023-05-03 MED ORDER — FREESTYLE LIBRE 3 PLUS SENSOR MISC: 12 refills | Status: DC

## 2023-05-03 NOTE — Progress Notes (Signed)
 Subjective:   Edward [name] 1955/11/18 05/03/2023  Chief Complaint  Patient presents with   Medical Management of Chronic Issues    22-month follow up; denies any main concerns for today's visit. States he will check his BP at home and states that BP usually runs 140/85.    HPI: Edward [name] presents today for re-assessment and management of chronic medical conditions.  [place] Interpreter  Lenya [phone] by ipad used for visit.   HYPERTENSION: Edward [name] presents for the medical management of hypertension.  Patient's current hypertension medication regimen is: Amlodipine  10mg  , Lisinopril  40mg   Patient is  currently taking prescribed medications for HTN.  Patient is  regularly keeping a check on BP at home. Reports BP usually is around 140/85 at home.  Adhering to low sodium diet: Not monitoring salt intake  Exercising Regularly: Yes Denies headache, dizziness, CP, SHOB, vision changes.    BP Readings from Last 3 Encounters:  05/03/23 134/82  03/03/23 138/77  07/22/22 (!) 153/85    DIABETES MELLITUS: Edward [name] presents for the medical management of diabetes.  Current diabetes medication regimen: Insulin  Glargine 10 units (was to increase by 2 units per previous visit but patient did not increase); Invokamet  XR 150-1000mg  BID Patient is  not adhering to a diabetic diet. He states he eats pretty much whatever is at the house and is not following a diet currently.  Patient is  exercising regularly.  Patient is  checking BS only intermittently with glucometer and states glucose levels are around 120-158. Patient is  checking their feet regularly.  Denies polydipsia, polyphagia, polyuria, open wounds or ulcers on feet.  Lab Results  Component Value Date   HGBA1C 8.8 (A) 03/03/2023    05/03/2023 Lab Results  Component Value Date   LABMICR 36.7 07/22/2022   LABMICR 11.7 03/04/2022   MICROALBUR <0.7 05/25/2016   MICROALBUR 1.8 08/28/2015    Wt Readings from Last 3  Encounters:  05/03/23 132 lb 14.4 oz (60.3 kg)  03/03/23 130 lb (59 kg)  07/22/22 130 lb 3.2 oz (59.1 kg)   The following portions of the patient's history were reviewed and updated as appropriate: past medical history, past surgical history, family history, social history, allergies, medications, and problem list.   Patient Active Problem List   Diagnosis Date Noted   Chest tube in place    Aortic atherosclerosis (HCC) 10/25/2020   Hyponatremia 10/22/2020   Pleural effusion 10/21/2020   Goiter, nontoxic, multinodular 02/15/2019   Lesion of thyroid  gland 11/30/2018   Vitamin B12 deficiency 04/12/2018   Scapular dysfunction 05/26/2017   Left knee pain 02/18/2016   Type 2 diabetes mellitus (HCC) 08/28/2015   Essential hypertension 08/28/2015   Routine general medical examination at a health care facility 08/28/2015   Hyperlipidemia 02/26/2013   Past Medical History:  Diagnosis Date   Aortic atherosclerosis (HCC) 10/25/2020   Diabetes mellitus without complication (HCC)    Hypertension    Past Surgical History:  Procedure Laterality Date   TUMOR REMOVAL     Family History  Problem Relation Age of Onset   Prostate cancer Father    Heart attack Paternal Grandfather    Outpatient Medications Prior to Visit  Medication Sig Dispense Refill   albuterol  (VENTOLIN  HFA) 108 (90 Base) MCG/ACT inhaler Inhale 2 puffs into the lungs every 6 (six) hours as needed for wheezing or shortness of breath. 8 g 0   amLODipine  (NORVASC ) 10 MG tablet TAKE 1 TABLET(10  MG) BY MOUTH DAILY 90 tablet 1   atorvastatin  (LIPITOR) 10 MG tablet Take 1 tablet (10 mg total) by mouth daily. 90 tablet 3   Canagliflozin -metFORMIN  HCl ER (INVOKAMET  XR) [phone] MG TB24 Take 2 tablets by mouth daily. 180 tablet 1   Continuous Blood Gluc Transmit (DEXCOM G6 TRANSMITTER) MISC Apply once per package instructions every 3 months. 1 each 1   diclofenac  Sodium (VOLTAREN ) 1 % GEL Apply 2 g topically 4 (four) times daily.  100 g 3   glucose blood (ONE TOUCH ULTRA TEST) test strip Use as instructed 100 each 12   hydrOXYzine  (ATARAX /VISTARIL ) 10 MG tablet Take 1-2 tablets (10-20 mg total) by mouth at bedtime as needed. 30 tablet 3   insulin  glargine, 1 Unit Dial , (TOUJEO ) 300 UNIT/ML Solostar Pen Inject 10 Units into the skin daily. 6 mL 1   insulin  glargine, 1 Unit Dial , (TOUJEO ) 300 UNIT/ML Solostar Pen Inject 10 Units into the skin daily. 6 mL 2   Lancets (ONETOUCH ULTRASOFT) lancets Use as instructed 100 each 12   lisinopril  (ZESTRIL ) 40 MG tablet Take 1 tablet (40 mg total) by mouth daily. 90 tablet 1   Continuous Blood Gluc Sensor (DEXCOM G6 SENSOR) MISC Apply per package instructions once every 10 days. 9 each 1   No facility-administered medications prior to visit.   No Known Allergies   ROS: A complete ROS was performed with pertinent positives/negatives noted in the HPI. The remainder of the ROS are negative.    Objective:   Today's Vitals   05/03/23 0936 05/03/23 1025  BP: (!) 147/82 134/82  Pulse: 93   SpO2: 97%   Weight: 132 lb 14.4 oz (60.3 kg)   Height: 5' 5 (1.651 m)     Physical Exam          GENERAL: Well-appearing, in NAD. Well nourished.  SKIN: Pink, warm and dry. No rash, lesion, ulceration, or ecchymoses.  Head: Normocephalic. NECK: Trachea midline. Full ROM w/o pain or tenderness.  RESPIRATORY: Chest wall symmetrical. Respirations even and non-labored.  CARDIAC: S1, S2 present, regular rate and rhythm without murmur or gallops. Peripheral pulses 2+ bilaterally.  MSK: Muscle tone and strength appropriate for age.  EXTREMITIES: Without clubbing, cyanosis, or edema.  NEUROLOGIC: No motor or sensory deficits. Steady, even gait. C2-C12 intact.  PSYCH/MENTAL STATUS: Alert, oriented x 3. Cooperative, appropriate mood and affect.   Diabetic Foot Exam - Simple   Simple Foot Form Diabetic Foot exam was performed with the following findings: Yes 05/03/2023  9:30 AM  Visual  Inspection No deformities, no ulcerations, no other skin breakdown bilaterally: Yes Sensation Testing Intact to touch and monofilament testing bilaterally: Yes Pulse Check Posterior Tibialis and Dorsalis pulse intact bilaterally: Yes Comments     Health Maintenance Due  Topic Date Due   Pneumonia Vaccine 58+ Years old (2 of 2 - PCV) 08/07/2013   DTaP/Tdap/Td (2 - Td or Tdap) 08/08/2022    Results for orders placed or performed in visit on 05/03/23  POCT Glucose (Device for Home Use)  Result Value Ref Range   Glucose Fasting, POC     POC Glucose 129 (A) 70 - 99 mg/dl      Assessment & Plan:  1. Type 2 diabetes mellitus with hyperglycemia, with long-term current use of insulin  (HCC) (Primary) Unable to check A1C due to last check less than 3 months prior. Will continue on Glargine 10 units every day and Inkovamet as directed. Glucose check 129 after eating in office  today. Discussed good diet, exercise and monitoring glucose regularly. Will start Freestyle Libre CGM system. Pt will return to office in 1 month for A1C w/ lab and 3 months for follow up. Will come to PCP to set up CGM if needed.   - POCT Glucose (Device for Home Use) - Continuous Glucose Sensor (FREESTYLE LIBRE 3 PLUS SENSOR) MISC; Change sensor every 15 days.  Dispense: 2 each; Refill: 12 - Hemoglobin A1c; Future - Basic Metabolic Panel (BMET); Future - Lipid Profile; Future  2. Essential hypertension Stable. Continue current regimen. Discussed use of arm cuff and monitoring BP, monitoring salt intake, and regular exercise.     Meds ordered this encounter  Medications   Continuous Glucose Sensor (FREESTYLE LIBRE 3 PLUS SENSOR) MISC    Sig: Change sensor every 15 days.    Dispense:  2 each    Refill:  12    Supervising Provider:   DE CUBA, RAYMOND J [[id number]]   Lab Orders         Hemoglobin A1c         Basic Metabolic Panel (BMET)         Lipid Profile         POCT Glucose (Device for Home Use)       Return for Lab Visit 06/04/23 for labs only; follow up in 3 months for DM2.    Patient to reach out to office if new, worrisome, or unresolved symptoms arise or if no improvement in patient's condition. Patient verbalized understanding and is agreeable to treatment plan. All questions answered to patient's satisfaction.    Thersia Schuyler Stark, [place]

## 2023-05-03 NOTE — Patient Instructions (Signed)
Please pick up your [facility] monitor. If you would like to set it up in the office please call.

## 2023-05-20 ENCOUNTER — Other Ambulatory Visit (HOSPITAL_BASED_OUTPATIENT_CLINIC_OR_DEPARTMENT_OTHER): Payer: Self-pay | Admitting: *Deleted

## 2023-05-20 DIAGNOSIS — I10 Essential (primary) hypertension: Secondary | ICD-10-CM

## 2023-05-20 MED ORDER — AMLODIPINE BESYLATE 10 MG PO TABS: ORAL_TABLET | ORAL | 1 refills | Status: DC

## 2023-05-23 ENCOUNTER — Other Ambulatory Visit (HOSPITAL_BASED_OUTPATIENT_CLINIC_OR_DEPARTMENT_OTHER): Payer: Self-pay | Admitting: *Deleted

## 2023-05-23 DIAGNOSIS — I10 Essential (primary) hypertension: Secondary | ICD-10-CM

## 2023-05-23 DIAGNOSIS — E1140 Type 2 diabetes mellitus with diabetic neuropathy, unspecified: Secondary | ICD-10-CM

## 2023-05-23 MED ORDER — LISINOPRIL 40 MG PO TABS: 40.0000 mg | ORAL_TABLET | Freq: Every day | ORAL | 1 refills | Status: DC

## 2023-05-23 MED ORDER — INVOKAMET XR 150-1000 MG PO TB24: 2.0000 | ORAL_TABLET | Freq: Every day | ORAL | 1 refills | Status: DC

## 2023-06-06 ENCOUNTER — Other Ambulatory Visit (HOSPITAL_BASED_OUTPATIENT_CLINIC_OR_DEPARTMENT_OTHER): Payer: Medicare HMO

## 2023-06-28 ENCOUNTER — Encounter (HOSPITAL_BASED_OUTPATIENT_CLINIC_OR_DEPARTMENT_OTHER): Payer: Medicare HMO

## 2023-07-21 ENCOUNTER — Other Ambulatory Visit (HOSPITAL_BASED_OUTPATIENT_CLINIC_OR_DEPARTMENT_OTHER): Payer: Medicare HMO

## 2023-07-29 DIAGNOSIS — Z794 Long term (current) use of insulin: Secondary | ICD-10-CM | POA: Diagnosis not present

## 2023-07-29 DIAGNOSIS — E1165 Type 2 diabetes mellitus with hyperglycemia: Secondary | ICD-10-CM | POA: Diagnosis not present

## 2023-07-30 LAB — HEMOGLOBIN A1C
Est. average glucose Bld gHb Est-mCnc: 192 mg/dL
Hgb A1c MFr Bld: 8.3 % — ABNORMAL HIGH (ref 4.8–5.6)

## 2023-07-30 LAB — BASIC METABOLIC PANEL WITH GFR
BUN/Creatinine Ratio: 24 (ref 10–24)
BUN: 16 mg/dL (ref 8–27)
CO2: 23 mmol/L (ref 20–29)
Calcium: 9.3 mg/dL (ref 8.6–10.2)
Chloride: 98 mmol/L (ref 96–106)
Creatinine, Ser: 0.68 mg/dL — ABNORMAL LOW (ref 0.76–1.27)
Glucose: 145 mg/dL — ABNORMAL HIGH (ref 70–99)
Potassium: 4.2 mmol/L (ref 3.5–5.2)
Sodium: 139 mmol/L (ref 134–144)
eGFR: 101 mL/min/{1.73_m2} (ref >59)

## 2023-07-30 LAB — LIPID PANEL
Chol/HDL Ratio: 2.7 ratio (ref 0.0–5.0)
Cholesterol, Total: 130 mg/dL (ref 100–199)
HDL: 49 mg/dL (ref >39)
LDL Chol Calc (NIH): 57 mg/dL (ref 0–99)
Triglycerides: 136 mg/dL (ref 0–149)
VLDL Cholesterol Cal: 24 mg/dL (ref 5–40)

## 2023-08-02 ENCOUNTER — Telehealth (HOSPITAL_BASED_OUTPATIENT_CLINIC_OR_DEPARTMENT_OTHER): Payer: Self-pay

## 2023-08-02 NOTE — Telephone Encounter (Signed)
 LVM for someone to call back to schedule AWV

## 2023-08-03 ENCOUNTER — Ambulatory Visit (HOSPITAL_BASED_OUTPATIENT_CLINIC_OR_DEPARTMENT_OTHER): Admitting: *Deleted

## 2023-08-03 ENCOUNTER — Encounter (HOSPITAL_BASED_OUTPATIENT_CLINIC_OR_DEPARTMENT_OTHER): Payer: Self-pay | Admitting: *Deleted

## 2023-08-03 ENCOUNTER — Encounter (HOSPITAL_BASED_OUTPATIENT_CLINIC_OR_DEPARTMENT_OTHER): Payer: Self-pay | Admitting: Family Medicine

## 2023-08-03 ENCOUNTER — Ambulatory Visit (INDEPENDENT_AMBULATORY_CARE_PROVIDER_SITE_OTHER): Payer: Medicare HMO | Admitting: Family Medicine

## 2023-08-03 VITALS — Ht 65.0 in | Wt 130.5 lb

## 2023-08-03 VITALS — BP 134/75 | HR 80 | Wt 130.5 lb

## 2023-08-03 DIAGNOSIS — E1165 Type 2 diabetes mellitus with hyperglycemia: Secondary | ICD-10-CM | POA: Diagnosis not present

## 2023-08-03 DIAGNOSIS — Z794 Long term (current) use of insulin: Secondary | ICD-10-CM | POA: Diagnosis not present

## 2023-08-03 DIAGNOSIS — I10 Essential (primary) hypertension: Secondary | ICD-10-CM

## 2023-08-03 DIAGNOSIS — Z0000 Encounter for general adult medical examination without abnormal findings: Secondary | ICD-10-CM

## 2023-08-03 NOTE — Progress Notes (Signed)
    Procedures performed today:    None.  Independent interpretation of notes and tests performed by another provider:   None.  Brief History, Exam, Impression, and Recommendations:    BP 134/75   Pulse 80   Wt 130 lb 8 oz (59.2 kg)   SpO2 96%   BMI 21.72 kg/m   Video interpreter [phone] Difficult to communicate. Patient poor historian.  Type 2 diabetes mellitus with hyperglycemia, with long-term current use of insulin [facility]) Assessment & Plan: Patient continues with canagliflozin-metformin.  He stopped insulin glargine when he went to visit family in [place] in December and has not started back with taking it. Denies any issues with medications at this time.  Denies any reported hypoglycemia episodes or readings.  He does infrequently check his blood sugar first thing in the morning, reports checking about once weekly and numbers generally being around 130-150.  Has not had any polyuria or polydipsia.  Most recent hemoglobin A1c was above goal, however had shown some improvement from prior reading. Can continue with Invokamet; he would like to resume with insulin glargine, cautioned to monitor blood glucose closely.  Advised on checking blood sugar each morning to obtain fasting result in order to better determine any changes needed with medication management Discussed referral to nutritionist, patient declines  Orders: -     Microalbumin / creatinine urine ratio  Essential hypertension Assessment & Plan: Blood pressure elevated in office today, systolic elevated, diastolic at goal, patient infrequently checks blood pressure at home.  He continues with amlodipine and lisinopril, denies any issues with his medications. Has had some dizziness reports Has noted some dizziness - when getting out of bed in the morning. Upon sitting up he notices dizziness. Will last about 30 seconds. No other issues during the day. On further discussion, will have mild symptoms during the day with  positional changes. Episodes brief, usually when standing from seated or laying position. For now, we can continue with current medication regimen, will need to continue to monitor to assess for need to adjust pharmacotherapy Recommend intermittent monitoring of blood pressure at home, DASH diet We will plan for close follow-up in about 3 months for monitoring of blood pressure and to determine if any changes to medication would be needed.    Return in about 3 months (around 11/02/2023) for diabetes, hypertension, 40 minutes.  Spent 36 minutes on this patient encounter, including preparation, chart review, face-to-face counseling with patient and coordination of care, and documentation of encounter   ___________________________________________ Emalynn Clewis de [place], MD, ABFM, [facility] Primary Care and Sports Medicine [facility]

## 2023-08-03 NOTE — Progress Notes (Signed)
 Annual Wellness Visit     Patient: Edward [name], Male    DOB: 24-May-1955, 68 y.o.   MRN: [id number]  Subjective  Chief Complaint  Patient presents with   Follow-up    3 months follow up patient has been having dizziness when sitting up has been going on for 1 month     Edward [name] is a 68 y.o. male who presents today for his Annual Wellness Visit. He reports consuming a general diet. Home exercise routine includes walking short distances, twisting body, and exercising arms. He generally feels fairly well. He reports sleeping well. He does not have additional problems to discuss today.   HPI  Vision:Within last year and Dental: No current dental problems and Last dental visit: currently has dentures   Medications: Outpatient Medications Prior to Visit  Medication Sig   albuterol (VENTOLIN HFA) 108 (90 Base) MCG/ACT inhaler Inhale 2 puffs into the lungs every 6 (six) hours as needed for wheezing or shortness of breath.   amLODipine (NORVASC) 10 MG tablet TAKE 1 TABLET(10 MG) BY MOUTH DAILY   atorvastatin (LIPITOR) 10 MG tablet Take 1 tablet (10 mg total) by mouth daily.   Canagliflozin-metFORMIN HCl ER (INVOKAMET XR) [phone] MG TB24 Take 2 tablets by mouth daily.   Continuous Blood Gluc Transmit (DEXCOM G6 TRANSMITTER) MISC Apply once per package instructions every 3 months.   Continuous Glucose Sensor (FREESTYLE LIBRE 3 PLUS SENSOR) MISC Change sensor every 15 days.   diclofenac Sodium (VOLTAREN) 1 % GEL Apply 2 g topically 4 (four) times daily.   glucose blood (ONE TOUCH ULTRA TEST) test strip Use as instructed   hydrOXYzine (ATARAX/VISTARIL) 10 MG tablet Take 1-2 tablets (10-20 mg total) by mouth at bedtime as needed.   insulin glargine, 1 Unit Dial, (TOUJEO) 300 UNIT/ML Solostar Pen Inject 10 Units into the skin daily.   insulin glargine, 1 Unit Dial, (TOUJEO) 300 UNIT/ML Solostar Pen Inject 10 Units into the skin daily.   Lancets (ONETOUCH ULTRASOFT) lancets Use as instructed    lisinopril (ZESTRIL) 40 MG tablet Take 1 tablet (40 mg total) by mouth daily.   No facility-administered medications prior to visit.    No Known Allergies  Patient Care Team: de [place], Buren Kos, MD as PCP - General (Family Medicine)  ROS     Objective  BP (!) 158/82 (BP Location: Right Arm, Patient Position: Sitting, Cuff Size: Normal)   Pulse 80   Wt 130 lb 8 oz (59.2 kg)   SpO2 96%   BMI 21.72 kg/m   Physical Exam    Most recent functional status assessment:     No data to display         Most recent fall risk assessment:    08/03/2023    9:11 AM  Fall Risk   Falls in the past year? 0  Number falls in past yr: 0  Injury with Fall? 0  Risk for fall due to : No Fall Risks  Follow up Falls evaluation completed    Most recent depression screenings:    08/03/2023    9:12 AM 05/03/2023    9:47 AM  PHQ 2/9 Scores  PHQ - 2 Score 0 0  PHQ- 9 Score 0 0   Most recent cognitive screening:     No data to display         Most recent Audit-C alcohol use screening    05/03/2023    9:43 AM  Alcohol Use Disorder Test (AUDIT)  1. How often do you have a drink containing alcohol? 0  2. How many drinks containing alcohol do you have on a typical day when you are drinking? 0  3. How often do you have six or more drinks on one occasion? 0  AUDIT-C Score 0   A score of 3 or more in women, and 4 or more in men indicates increased risk for alcohol abuse, EXCEPT if all of the points are from question 1   Vision/Hearing Screen: No results found.   No results found for any visits on 08/03/23.    Assessment & Plan   Annual wellness visit done today including the all of the following: Reviewed patient's Family Medical History Reviewed and updated list of patient's medical providers Assessment of cognitive impairment was done Assessed patient's functional ability Established a written schedule for health screening services Health Risk Assessent Completed and  Reviewed  Exercise Activities and Dietary recommendations  Goals   None     Immunization History  Administered Date(s) Administered   PFIZER Comirnaty(Gray Top)Covid-19 Tri-Sucrose Vaccine 10/31/2020   PFIZER(Purple Top)SARS-COV-2 Vaccination 08/06/2019, 10/31/2019   Pneumococcal Polysaccharide-23 08/07/2012   Tdap 08/07/2012   Zoster Recombinant(Shingrix) 02/08/2020, 04/11/2020    Health Maintenance  Topic Date Due   Pneumonia Vaccine 78+ Years old (2 of 2 - PCV) 08/07/2013   DTaP/Tdap/Td (2 - Td or Tdap) 08/08/2022   COVID-19 Vaccine (4 - 2024-25 season) 01/02/2023   Diabetic kidney evaluation - Urine ACR  07/22/2023   INFLUENZA VACCINE  12/02/2023   HEMOGLOBIN A1C  01/29/2024   OPHTHALMOLOGY EXAM  03/10/2024   FOOT EXAM  05/02/2024   Diabetic kidney evaluation - eGFR measurement  07/28/2024   Medicare Annual Wellness (AWV)  08/02/2024   Colonoscopy  01/12/2026   Hepatitis C Screening  Completed   Zoster Vaccines- Shingrix  Completed   HPV VACCINES  Aged Out     Discussed health benefits of physical activity, and encouraged him to engage in regular exercise appropriate for his age and condition.    Problem List Items Addressed This Visit       Cardiovascular and Mediastinum   Essential hypertension   Blood pressure elevated in office today, systolic elevated, diastolic at goal, patient infrequently checks blood pressure at home.  He continues with amlodipine and lisinopril, denies any issues with his medications. Has had some dizziness reports Has noted some dizziness - when getting out of bed in the morning. Upon sitting up he notices dizziness. Will last about 30 seconds. No other issues during the day. On further discussion, will have mild symptoms during the day with positional changes. Episodes brief, usually when standing from seated or laying position. For now, we can continue with current medication regimen, will need to continue to monitor to assess for need  to adjust pharmacotherapy Recommend intermittent monitoring of blood pressure at home, DASH diet We will plan for close follow-up in about 3 months for monitoring of blood pressure and to determine if any changes to medication would be needed.         Endocrine   Type 2 diabetes mellitus (HCC) - Primary   Patient continues with canagliflozin-metformin.  He stopped insulin glargine when he went to visit family in [place] in December and has not started back with taking it. Denies any issues with medications at this time.  Denies any reported hypoglycemia episodes or readings.  He does infrequently check his blood sugar first thing in the morning, reports checking about once  weekly and numbers generally being around 130-150.  Has not had any polyuria or polydipsia.  Most recent hemoglobin A1c was above goal, however had shown some improvement from prior reading. Can continue with Invokamet, may need to consider adjustment based on A1c.  Advised on checking blood sugar each morning to obtain fasting result in order to better determine any changes needed with medication management Discussed referral to nutritionist, patient declines      Relevant Orders   Microalbumin / creatinine urine ratio   Other Visit Diagnoses       Encounter for Medicare annual wellness exam           Return in about 3 months (around 11/02/2023) for diabetes, hypertension, 40 minutes.     Leshaun Biebel, Farley Ly, CMA

## 2023-08-03 NOTE — Assessment & Plan Note (Addendum)
 Patient continues with canagliflozin-metformin.  He stopped insulin glargine when he went to visit family in [place] in December and has not started back with taking it. Denies any issues with medications at this time.  Denies any reported hypoglycemia episodes or readings.  He does infrequently check his blood sugar first thing in the morning, reports checking about once weekly and numbers generally being around 130-150.  Has not had any polyuria or polydipsia.  Most recent hemoglobin A1c was above goal, however had shown some improvement from prior reading. Can continue with Invokamet; he would like to resume with insulin glargine, cautioned to monitor blood glucose closely.  Advised on checking blood sugar each morning to obtain fasting result in order to better determine any changes needed with medication management Discussed referral to nutritionist, patient declines

## 2023-08-03 NOTE — Assessment & Plan Note (Addendum)
 Blood pressure elevated in office today, systolic elevated, diastolic at goal, patient infrequently checks blood pressure at home.  He continues with amlodipine and lisinopril, denies any issues with his medications. Has had some dizziness reports Has noted some dizziness - when getting out of bed in the morning. Upon sitting up he notices dizziness. Will last about 30 seconds. No other issues during the day. On further discussion, will have mild symptoms during the day with positional changes. Episodes brief, usually when standing from seated or laying position. For now, we can continue with current medication regimen, will need to continue to monitor to assess for need to adjust pharmacotherapy Recommend intermittent monitoring of blood pressure at home, DASH diet We will plan for close follow-up in about 3 months for monitoring of blood pressure and to determine if any changes to medication would be needed.

## 2023-08-03 NOTE — Patient Instructions (Signed)
  Medication Instructions:  Your physician recommends that you continue on your current medications as directed. Please refer to the Current Medication list given to you today. --If you need a refill on any your medications before your next appointment, please call your pharmacy first. If no refills are authorized on file call the office.--   Follow-Up: Your next appointment:   Your physician recommends that you schedule a follow-up appointment in: 3 months follow up  with Dr. de Peru  You will receive a text message or e-mail with a link to a survey about your care and experience with [place] today! We would greatly appreciate your feedback!   Thanks for letting us be apart of your health journey!!  Primary Care and Sports Medicine   Dr. Ceasar Mons Peru   We encourage you to activate your patient portal called "MyChart".  Sign up information is provided on this After Visit Summary.  MyChart is used to connect with patients for Virtual Visits (Telemedicine).  Patients are able to view lab/test results, encounter notes, upcoming appointments, etc.  Non-urgent messages can be sent to your provider as well. To learn more about what you can do with MyChart, please visit --  [email or web address].

## 2023-08-03 NOTE — Progress Notes (Deleted)
 Annual Wellness Visit     Patient: Edward [name], Male    DOB: 24-May-1955, 68 y.o.   MRN: [id number]  Subjective  Chief Complaint  Patient presents with   Follow-up    3 months follow up patient has been having dizziness when sitting up has been going on for 1 month     Edward [name] is a 68 y.o. male who presents today for his Annual Wellness Visit. He reports consuming a general diet. Home exercise routine includes walking short distances, twisting body, and exercising arms. He generally feels fairly well. He reports sleeping well. He does not have additional problems to discuss today.   HPI  Vision:Within last year and Dental: No current dental problems and Last dental visit: currently has dentures   Medications: Outpatient Medications Prior to Visit  Medication Sig   albuterol (VENTOLIN HFA) 108 (90 Base) MCG/ACT inhaler Inhale 2 puffs into the lungs every 6 (six) hours as needed for wheezing or shortness of breath.   amLODipine (NORVASC) 10 MG tablet TAKE 1 TABLET(10 MG) BY MOUTH DAILY   atorvastatin (LIPITOR) 10 MG tablet Take 1 tablet (10 mg total) by mouth daily.   Canagliflozin-metFORMIN HCl ER (INVOKAMET XR) [phone] MG TB24 Take 2 tablets by mouth daily.   Continuous Blood Gluc Transmit (DEXCOM G6 TRANSMITTER) MISC Apply once per package instructions every 3 months.   Continuous Glucose Sensor (FREESTYLE LIBRE 3 PLUS SENSOR) MISC Change sensor every 15 days.   diclofenac Sodium (VOLTAREN) 1 % GEL Apply 2 g topically 4 (four) times daily.   glucose blood (ONE TOUCH ULTRA TEST) test strip Use as instructed   hydrOXYzine (ATARAX/VISTARIL) 10 MG tablet Take 1-2 tablets (10-20 mg total) by mouth at bedtime as needed.   insulin glargine, 1 Unit Dial, (TOUJEO) 300 UNIT/ML Solostar Pen Inject 10 Units into the skin daily.   insulin glargine, 1 Unit Dial, (TOUJEO) 300 UNIT/ML Solostar Pen Inject 10 Units into the skin daily.   Lancets (ONETOUCH ULTRASOFT) lancets Use as instructed    lisinopril (ZESTRIL) 40 MG tablet Take 1 tablet (40 mg total) by mouth daily.   No facility-administered medications prior to visit.    No Known Allergies  Patient Care Team: de Peru, Buren Kos, MD as PCP - General (Family Medicine)  ROS     Objective  BP (!) 158/82 (BP Location: Right Arm, Patient Position: Sitting, Cuff Size: Normal)   Pulse 80   Wt 130 lb 8 oz (59.2 kg)   SpO2 96%   BMI 21.72 kg/m   Physical Exam    Most recent functional status assessment:     No data to display         Most recent fall risk assessment:    08/03/2023    9:11 AM  Fall Risk   Falls in the past year? 0  Number falls in past yr: 0  Injury with Fall? 0  Risk for fall due to : No Fall Risks  Follow up Falls evaluation completed    Most recent depression screenings:    08/03/2023    9:12 AM 05/03/2023    9:47 AM  PHQ 2/9 Scores  PHQ - 2 Score 0 0  PHQ- 9 Score 0 0   Most recent cognitive screening:     No data to display         Most recent Audit-C alcohol use screening    05/03/2023    9:43 AM  Alcohol Use Disorder Test (AUDIT)  1. How often do you have a drink containing alcohol? 0  2. How many drinks containing alcohol do you have on a typical day when you are drinking? 0  3. How often do you have six or more drinks on one occasion? 0  AUDIT-C Score 0   A score of 3 or more in women, and 4 or more in men indicates increased risk for alcohol abuse, EXCEPT if all of the points are from question 1   Vision/Hearing Screen: No results found.   No results found for any visits on 08/03/23.    Assessment & Plan   Annual wellness visit done today including the all of the following: Reviewed patient's Family Medical History Reviewed and updated list of patient's medical providers Assessment of cognitive impairment was done Assessed patient's functional ability Established a written schedule for health screening services Health Risk Assessent Completed and  Reviewed  Exercise Activities and Dietary recommendations  Goals   None     Immunization History  Administered Date(s) Administered   PFIZER Comirnaty(Gray Top)Covid-19 Tri-Sucrose Vaccine 10/31/2020   PFIZER(Purple Top)SARS-COV-2 Vaccination 08/06/2019, 10/31/2019   Pneumococcal Polysaccharide-23 08/07/2012   Tdap 08/07/2012   Zoster Recombinant(Shingrix) 02/08/2020, 04/11/2020    Health Maintenance  Topic Date Due   Pneumonia Vaccine 78+ Years old (2 of 2 - PCV) 08/07/2013   DTaP/Tdap/Td (2 - Td or Tdap) 08/08/2022   COVID-19 Vaccine (4 - 2024-25 season) 01/02/2023   Diabetic kidney evaluation - Urine ACR  07/22/2023   INFLUENZA VACCINE  12/02/2023   HEMOGLOBIN A1C  01/29/2024   OPHTHALMOLOGY EXAM  03/10/2024   FOOT EXAM  05/02/2024   Diabetic kidney evaluation - eGFR measurement  07/28/2024   Medicare Annual Wellness (AWV)  08/02/2024   Colonoscopy  01/12/2026   Hepatitis C Screening  Completed   Zoster Vaccines- Shingrix  Completed   HPV VACCINES  Aged Out     Discussed health benefits of physical activity, and encouraged him to engage in regular exercise appropriate for his age and condition.    Problem List Items Addressed This Visit       Cardiovascular and Mediastinum   Essential hypertension   Blood pressure elevated in office today, systolic elevated, diastolic at goal, patient infrequently checks blood pressure at home.  He continues with amlodipine and lisinopril, denies any issues with his medications. Has had some dizziness reports Has noted some dizziness - when getting out of bed in the morning. Upon sitting up he notices dizziness. Will last about 30 seconds. No other issues during the day. On further discussion, will have mild symptoms during the day with positional changes. Episodes brief, usually when standing from seated or laying position. For now, we can continue with current medication regimen, will need to continue to monitor to assess for need  to adjust pharmacotherapy Recommend intermittent monitoring of blood pressure at home, DASH diet We will plan for close follow-up in about 3 months for monitoring of blood pressure and to determine if any changes to medication would be needed.         Endocrine   Type 2 diabetes mellitus (HCC) - Primary   Patient continues with canagliflozin-metformin.  He stopped insulin glargine when he went to visit family in [place] in December and has not started back with taking it. Denies any issues with medications at this time.  Denies any reported hypoglycemia episodes or readings.  He does infrequently check his blood sugar first thing in the morning, reports checking about once  weekly and numbers generally being around 130-150.  Has not had any polyuria or polydipsia.  Most recent hemoglobin A1c was above goal, however had shown some improvement from prior reading. Can continue with Invokamet, may need to consider adjustment based on A1c.  Advised on checking blood sugar each morning to obtain fasting result in order to better determine any changes needed with medication management Discussed referral to nutritionist, patient declines      Relevant Orders   Microalbumin / creatinine urine ratio   Other Visit Diagnoses       Encounter for Medicare annual wellness exam           Return in about 3 months (around 11/02/2023) for diabetes, hypertension, 40 minutes.     Leshaun Biebel, Farley Ly, CMA

## 2023-08-04 LAB — MICROALBUMIN / CREATININE URINE RATIO
Creatinine, Urine: 32.7 mg/dL
Microalb/Creat Ratio: 23 mg/g{creat} (ref 0–29)
Microalbumin, Urine: 7.6 ug/mL

## 2023-11-02 ENCOUNTER — Ambulatory Visit (HOSPITAL_BASED_OUTPATIENT_CLINIC_OR_DEPARTMENT_OTHER): Admitting: Family Medicine

## 2023-11-02 ENCOUNTER — Encounter (HOSPITAL_BASED_OUTPATIENT_CLINIC_OR_DEPARTMENT_OTHER): Payer: Self-pay | Admitting: Family Medicine

## 2023-11-02 VITALS — BP 136/80 | HR 76 | Ht 61.0 in | Wt 129.9 lb

## 2023-11-02 DIAGNOSIS — Z794 Long term (current) use of insulin: Secondary | ICD-10-CM

## 2023-11-02 DIAGNOSIS — I10 Essential (primary) hypertension: Secondary | ICD-10-CM | POA: Diagnosis not present

## 2023-11-02 DIAGNOSIS — E1165 Type 2 diabetes mellitus with hyperglycemia: Secondary | ICD-10-CM

## 2023-11-02 LAB — POCT GLYCOSYLATED HEMOGLOBIN (HGB A1C)
HbA1c POC (<> result, manual entry): 7.8 % (ref 4.0–5.6)
Hemoglobin A1C: 7.8 % — AB (ref 4.0–5.6)

## 2023-11-02 NOTE — Progress Notes (Signed)
    Procedures performed today:    None.  Independent interpretation of notes and tests performed by another provider:   None.  Brief History, Exam, Impression, and Recommendations:    BP 136/80 (BP Location: Right Arm, Patient Position: Sitting, Cuff Size: Normal)   Pulse 76   Ht 5' 1 (1.549 m)   Wt 129 lb 14.4 oz (58.9 kg)   SpO2 97%   BMI 24.54 kg/m   Video interpreter [phone]  Type 2 diabetes mellitus with hyperglycemia, with long-term current use of insulin  [facility]) Assessment & Plan: Patient continues with canagliflozin -metformin .  At last visit, patient had planned to restart with insulin . He did so but was feeling tired with insulin , and so stopped insulin .  He does infrequently check his blood sugar first thing in the morning, reports checking about three times weekly and numbers generally being around 130-140.  Has not had any polyuria or polydipsia.  Most recent hemoglobin A1c was above goal, however had shown some improvement from prior reading. Can continue with Invokamet . We will check A1c today. Goal 7.5%, last A1c 8.3%. A1c today is 7.8%. Can continue with current meds, focus on lifestyle modifications.  Orders: -     POCT glycosylated hemoglobin (Hb A1C)  Essential hypertension Assessment & Plan: Blood pressure borderline in office today, patient infrequently checks blood pressure at home - home readings similar to today's reading.  He continues with amlodipine  and lisinopril , denies any issues with his medications. Has noted some dizziness - when getting out of bed in the morning. Upon sitting up he notices dizziness. Will last about 30 seconds. No other issues during the day. On further discussion, will have mild symptoms during the day with positional changes. Episodes brief, usually when standing from seated or laying position. He indicates that this dizziness is better now than previously For now, we can continue with current medication regimen, will need to  continue to monitor to assess for need to adjust pharmacotherapy Recommend intermittent monitoring of blood pressure at home, DASH diet We will plan for close follow-up in about 3 months for monitoring of blood pressure and to determine if any changes to medication would be needed.   Return in about 3 months (around 02/02/2024).   ___________________________________________ Trinia Georgi de Peru, MD, ABFM, CAQSM Primary Care and Sports Medicine [facility]

## 2023-11-02 NOTE — Assessment & Plan Note (Signed)
 Blood pressure borderline in office today, patient infrequently checks blood pressure at home - home readings similar to today's reading.  He continues with amlodipine  and lisinopril , denies any issues with his medications. Has noted some dizziness - when getting out of bed in the morning. Upon sitting up he notices dizziness. Will last about 30 seconds. No other issues during the day. On further discussion, will have mild symptoms during the day with positional changes. Episodes brief, usually when standing from seated or laying position. He indicates that this dizziness is better now than previously For now, we can continue with current medication regimen, will need to continue to monitor to assess for need to adjust pharmacotherapy Recommend intermittent monitoring of blood pressure at home, DASH diet We will plan for close follow-up in about 3 months for monitoring of blood pressure and to determine if any changes to medication would be needed.

## 2023-11-02 NOTE — Assessment & Plan Note (Addendum)
 Patient continues with canagliflozin -metformin .  At last visit, patient had planned to restart with insulin . He did so but was feeling tired with insulin , and so stopped insulin .  He does infrequently check his blood sugar first thing in the morning, reports checking about three times weekly and numbers generally being around 130-140.  Has not had any polyuria or polydipsia.  Most recent hemoglobin A1c was above goal, however had shown some improvement from prior reading. Can continue with Invokamet . We will check A1c today. Goal 7.5%, last A1c 8.3%. A1c today is 7.8%. Can continue with current meds, focus on lifestyle modifications.

## 2023-11-02 NOTE — Patient Instructions (Signed)
 ?  Medication Instructions:  ?Your physician recommends that you continue on your current medications as directed. Please refer to the Current Medication list given to you today. ?--If you need a refill on any your medications before your next appointment, please call your pharmacy first. If no refills are authorized on file call the office.-- ? ? ? ?Follow-Up: ?Your next appointment:   ?Your physician recommends that you schedule a follow-up appointment in: 3 month follow-up with Dr. de Peru ? ?You will receive a text message or e-mail with a link to a survey about your care and experience with [place] today! We would greatly appreciate your feedback!  ? ?Thanks for letting us be apart of your health journey!!  ?Primary Care and Sports Medicine  ? ?Dr. Marcy Salvo de Peru  ? ?We encourage you to activate your patient portal called "MyChart".  Sign up information is provided on this After Visit Summary.  MyChart is used to connect with patients for Virtual Visits (Telemedicine).  Patients are able to view lab/test results, encounter notes, upcoming appointments, etc.  Non-urgent messages can be sent to your provider as well. To learn more about what you can do with MyChart, please visit --  [email or web address].    ?

## 2023-11-16 ENCOUNTER — Other Ambulatory Visit (HOSPITAL_BASED_OUTPATIENT_CLINIC_OR_DEPARTMENT_OTHER): Payer: Self-pay | Admitting: *Deleted

## 2023-11-16 DIAGNOSIS — E1140 Type 2 diabetes mellitus with diabetic neuropathy, unspecified: Secondary | ICD-10-CM

## 2023-11-16 DIAGNOSIS — I10 Essential (primary) hypertension: Secondary | ICD-10-CM

## 2023-11-16 MED ORDER — LISINOPRIL 40 MG PO TABS: 40.0000 mg | ORAL_TABLET | Freq: Every day | ORAL | 1 refills | Status: DC

## 2024-01-26 NOTE — Progress Notes (Signed)
 Edward [name]                                          MRN: [id number]   01/26/2024   The VBCI Quality Team Specialist reviewed this patient medical record for the purposes of chart review for care gap closure. The following were reviewed: chart review for care gap closure-diabetic eye exam.    VBCI Quality Team

## 2024-02-02 ENCOUNTER — Ambulatory Visit (HOSPITAL_BASED_OUTPATIENT_CLINIC_OR_DEPARTMENT_OTHER): Admitting: Family Medicine

## 2024-02-02 ENCOUNTER — Ambulatory Visit (INDEPENDENT_AMBULATORY_CARE_PROVIDER_SITE_OTHER): Admitting: Family Medicine

## 2024-02-02 ENCOUNTER — Encounter (HOSPITAL_BASED_OUTPATIENT_CLINIC_OR_DEPARTMENT_OTHER): Payer: Self-pay | Admitting: Family Medicine

## 2024-02-02 VITALS — BP 151/84 | HR 77 | Wt 130.3 lb

## 2024-02-02 DIAGNOSIS — R079 Chest pain, unspecified: Secondary | ICD-10-CM | POA: Diagnosis not present

## 2024-02-02 DIAGNOSIS — I10 Essential (primary) hypertension: Secondary | ICD-10-CM

## 2024-02-02 DIAGNOSIS — R42 Dizziness and giddiness: Secondary | ICD-10-CM | POA: Insufficient documentation

## 2024-02-02 DIAGNOSIS — E1165 Type 2 diabetes mellitus with hyperglycemia: Secondary | ICD-10-CM

## 2024-02-02 LAB — POCT GLYCOSYLATED HEMOGLOBIN (HGB A1C)
HbA1c POC (<> result, manual entry): 9.2 % (ref 4.0–5.6)
Hemoglobin A1C: 9.2 % — AB (ref 4.0–5.6)

## 2024-02-02 NOTE — Assessment & Plan Note (Addendum)
 Patient continues with canagliflozin -metformin .  Previously, patient had planned to restart with insulin , but did not.  He does infrequently check his blood sugar first thing in the morning, reports checking about three times weekly and numbers generally have been increasing recently.  Has not had any polyuria or polydipsia.  Most recent hemoglobin A1c was above goal, however had shown some improvement from prior reading. We will check A1c today. Goal 7.5%, last A1c 7.8%. A1c today is 9.2%.  Given this increase, we will have patient restart long-acting insulin .  He indicates that he does have this at home and does not need new prescription currently.  We will also continue with invokamet 

## 2024-02-02 NOTE — Assessment & Plan Note (Signed)
 Blood pressure borderline in office today, patient infrequently checks blood pressure at home - home readings similar to today's reading.  He continues with amlodipine  and lisinopril , denies any issues with his medications. He has been noticing that his blood sugars have been running low but higher, discussed that if that is the case, this can also contribute to blood pressure running higher as well. Recommend intermittent monitoring of blood pressure at home, DASH diet

## 2024-02-02 NOTE — Patient Instructions (Signed)
  Medication Instructions:  Your physician recommends that you continue on your current medications as directed. Please refer to the Current Medication list given to you today. --If you need a refill on any your medications before your next appointment, please call your pharmacy first. If no refills are authorized on file call the office.--   Start back on 10 units of insulin    Follow-Up: Your next appointment:   Your physician recommends that you schedule a follow-up appointment in: 4-6 week follow up  with Dr. de Peru  You will receive a text message or e-mail with a link to a survey about your care and experience with us  today! We would greatly appreciate your feedback!   Thanks for letting us  be apart of your health journey!!  Primary Care and Sports Medicine   Dr. Quintin sheerer Peru   We encourage you to activate your patient portal called MyChart.  Sign up information is provided on this After Visit Summary.  MyChart is used to connect with patients for Virtual Visits (Telemedicine).  Patients are able to view lab/test results, encounter notes, upcoming appointments, etc.  Non-urgent messages can be sent to your provider as well. To learn more about what you can do with MyChart, please visit --  [email or web address].

## 2024-02-02 NOTE — Progress Notes (Signed)
 Procedures performed today:    None.  Independent interpretation of notes and tests performed by another provider:   None.  Brief History, Exam, Impression, and Recommendations:    BP (!) 151/84 (BP Location: Left Arm, Patient Position: Sitting, Cuff Size: Normal)   Pulse 77   Wt 130 lb 4.8 oz (59.1 kg)   SpO2 98%   BMI 24.62 kg/m   Video interpreter utilized during visit: #[id number]  Type 2 diabetes mellitus with hyperglycemia, unspecified whether long term insulin  use (HCC) Assessment & Plan: Patient continues with canagliflozin -metformin .  Previously, patient had planned to restart with insulin , but did not.  He does infrequently check his blood sugar first thing in the morning, reports checking about three times weekly and numbers generally have been increasing recently.  Has not had any polyuria or polydipsia.  Most recent hemoglobin A1c was above goal, however had shown some improvement from prior reading. We will check A1c today. Goal 7.5%, last A1c 7.8%. A1c today is 9.2%.  Given this increase, we will have patient restart long-acting insulin .  He indicates that he does have this at home and does not need new prescription currently.  We will also continue with invokamet   Orders: -     POCT glycosylated hemoglobin (Hb A1C)  Essential hypertension Assessment & Plan: Blood pressure borderline in office today, patient infrequently checks blood pressure at home - home readings similar to today's reading.  He continues with amlodipine  and lisinopril , denies any issues with his medications. He has been noticing that his blood sugars have been running low but higher, discussed that if that is the case, this can also contribute to blood pressure running higher as well. Recommend intermittent monitoring of blood pressure at home, DASH diet   Chest pain, unspecified type Assessment & Plan: Patient notes that he has been having some chest pain which has started occurring about the  past month.  It is primarily centrally located.  He really only notices it virtually in the morning upon waking.  Denies significant symptoms during the day.  No exertional symptoms.  Reports history of reflux issues in the past, however this does feel somewhat different.  No associated shortness of breath or trouble breathing.  Pain is typically short-lived.  Not necessarily associated with any certain foods that he is aware of. On exam, patient is in no acute distress, vital signs stable.  Cardiovascular exam with regular rate and rhythm, lungs clear to auscultation bilaterally. Based upon description, I do feel that chest pain is less likely related to a cardiac etiology, however his past medical history would increase concern of potential heart related issue.  We suspect that symptoms are more likely to be GI related.  We discussed considerations, patient is amenable to meeting with cardiologist for further evaluation, referral placed today.  He would prefer to hold off on GI referral for now which is fine.  Orders: -     Ambulatory referral to Cardiology  Dizziness Assessment & Plan: He is also noted that he has had some dizziness over the past few months.  This primarily occurs when he goes from a lying position to a seated or standing position.  He has not noticed symptoms any other time.  No issues when going from sitting to standing.  Symptoms will be short-lived and then resolved.  Has not had any associated nausea or vomiting.  Not aware of any prior similar issues in the past. We discussed general considerations.  Patient would prefer  to meet with neurologist to review ongoing symptoms, feel this is reasonable, referral placed today.  Orders: -     Ambulatory referral to Neurology  Return in about 4 weeks (around 03/01/2024) for diabetes, med check.   ___________________________________________ Edward Dafoe de Peru, MD, ABFM, CAQSM Primary Care and Sports Medicine [facility]

## 2024-02-02 NOTE — Assessment & Plan Note (Signed)
 He is also noted that he has had some dizziness over the past few months.  This primarily occurs when he goes from a lying position to a seated or standing position.  He has not noticed symptoms any other time.  No issues when going from sitting to standing.  Symptoms will be short-lived and then resolved.  Has not had any associated nausea or vomiting.  Not aware of any prior similar issues in the past. We discussed general considerations.  Patient would prefer to meet with neurologist to review ongoing symptoms, feel this is reasonable, referral placed today.

## 2024-02-02 NOTE — Assessment & Plan Note (Signed)
 Patient notes that he has been having some chest pain which has started occurring about the past month.  It is primarily centrally located.  He really only notices it virtually in the morning upon waking.  Denies significant symptoms during the day.  No exertional symptoms.  Reports history of reflux issues in the past, however this does feel somewhat different.  No associated shortness of breath or trouble breathing.  Pain is typically short-lived.  Not necessarily associated with any certain foods that he is aware of. On exam, patient is in no acute distress, vital signs stable.  Cardiovascular exam with regular rate and rhythm, lungs clear to auscultation bilaterally. Based upon description, I do feel that chest pain is less likely related to a cardiac etiology, however his past medical history would increase concern of potential heart related issue.  We suspect that symptoms are more likely to be GI related.  We discussed considerations, patient is amenable to meeting with cardiologist for further evaluation, referral placed today.  He would prefer to hold off on GI referral for now which is fine.

## 2024-02-03 ENCOUNTER — Ambulatory Visit (HOSPITAL_BASED_OUTPATIENT_CLINIC_OR_DEPARTMENT_OTHER): Admitting: Family Medicine

## 2024-02-03 NOTE — Addendum Note (Signed)
 Addended by: DE PERU, QUINTIN J on: 02/03/2024 09:03 AM   Modules accepted: Orders

## 2024-02-06 ENCOUNTER — Telehealth: Payer: Self-pay

## 2024-02-06 NOTE — Progress Notes (Signed)
 Complex Care Management Note  Care Guide Note 02/06/2024 Name: Edward [name] MRN: [id number] DOB: [birth year]  Edward [name] is a 68 y.o. year old male who sees de Peru, Quintin PARAS, MD for primary care. I reached out to Edward [name] by phone today to offer complex care management services.  [name] was given information about Complex Care Management services today including:   The Complex Care Management services include support from the care team which includes your Nurse Care Manager, Clinical Social Worker, or Pharmacist.  The Complex Care Management team is here to help remove barriers to the health concerns and goals most important to you. Complex Care Management services are voluntary, and the patient may decline or stop services at any time by request to their care team member.   Complex Care Management Consent Status: Patient agreed to services and verbal consent obtained.   Follow up plan:  Telephone appointment with complex care management team member scheduled for:  02/14/24 & 02/17/24.  Encounter Outcome:  Patient Scheduled  Dreama [facility]  [facility], [facility] VBCI Assistant Direct Dial : [phone]  Fax: [phone]

## 2024-02-14 ENCOUNTER — Other Ambulatory Visit: Payer: Self-pay

## 2024-02-14 DIAGNOSIS — Z794 Long term (current) use of insulin: Secondary | ICD-10-CM

## 2024-02-14 DIAGNOSIS — E1140 Type 2 diabetes mellitus with diabetic neuropathy, unspecified: Secondary | ICD-10-CM

## 2024-02-14 DIAGNOSIS — E1165 Type 2 diabetes mellitus with hyperglycemia: Secondary | ICD-10-CM

## 2024-02-14 DIAGNOSIS — I10 Essential (primary) hypertension: Secondary | ICD-10-CM

## 2024-02-14 MED ORDER — FREESTYLE LIBRE 3 PLUS SENSOR MISC: 3 refills | Status: DC

## 2024-02-14 MED ORDER — INVOKAMET XR 150-1000 MG PO TB24: 2.0000 | ORAL_TABLET | Freq: Every day | ORAL | 1 refills | Status: AC

## 2024-02-14 MED ORDER — AMLODIPINE BESYLATE 10 MG PO TABS: ORAL_TABLET | ORAL | 1 refills | Status: DC

## 2024-02-14 MED ORDER — LISINOPRIL 40 MG PO TABS: 40.0000 mg | ORAL_TABLET | Freq: Every day | ORAL | 1 refills | Status: DC

## 2024-02-14 MED ORDER — ATORVASTATIN CALCIUM 10 MG PO TABS: 10.0000 mg | ORAL_TABLET | Freq: Every day | ORAL | 3 refills | Status: DC

## 2024-02-14 MED ORDER — TOUJEO MAX SOLOSTAR 300 UNIT/ML ~~LOC~~ SOPN: 10.0000 [IU] | PEN_INJECTOR | Freq: Every day | SUBCUTANEOUS | 0 refills | Status: DC

## 2024-02-14 NOTE — Progress Notes (Signed)
 02/14/2024 Name: Edward [name] MRN: [id number] DOB: 27-Jan-1956  Chief Complaint  Patient presents with   Diabetes   Medication Management    Darrick ROBIE MCNIEL is a 68 y.o. year old male who presented for a telephone visit.   They were referred to the pharmacist by their PCP for assistance in managing diabetes and complex medication management.    Subjective:  Care Team: Primary Care Provider: de [place], Quintin PARAS, MD ; Next Scheduled Visit:  Future Appointments  Date Time Provider Department Center  02/17/2024  9:30 AM Arno Rosaline SQUIBB, RN [facility] None  02/21/2024  1:30 PM Pandora Cadet, [facility] None  03/19/2024 10:50 AM de [place], Raymond J, MD [facility] [phone] Drawbr   Medication Access/Adherence  Current Pharmacy:  [id number] [place], [place] - [address] AT [facility] [address] [place] [address] Phone: [phone] Fax: [phone]  [facility] [address] [place] [address] Phone: [phone] Fax: [phone]   Patient reports affordability concerns with their medications: none reported by dtr  Patient reports access/transportation concerns to their pharmacy: dtr helps pick up medications  Patient reports adherence concerns with their medications:  non adherence noted   Amlodipine  last filled 05/20/2023 Lisinopril  last filled 02/14/2024 Atorvastatin  last filled 03/03/2023 Trulicity  last filled 07/03/2021; not on current med list, not mentioned in last PCP visit. Invokamet  [phone] last filled 02/22/2023  Diabetes:  Current medications: per med list and lost OV note, should be taking insulin  glargine (Toujeo ) 10 units daily, invokamet  150-1000mg  2 tabs daily.   The 10-year ASCVD risk score (Arnett DK, et al., 2019) is: 33.4%   Values used to calculate the score:     Age: 68 years     Clincally relevant sex: Male     Is Non-Hispanic  African American: No     Diabetic: Yes     Tobacco smoker: No     Systolic Blood Pressure: 151 mmHg     Is BP treated: Yes     HDL Cholesterol: 49 mg/dL     Total Cholesterol: 130 mg/dL  Medications tried in the past: trulicity  (non insulin  injectable, unsure when last taken)   Current glucose readings: unable to provide, CGM not filled since start of the year. Dtr is requesting all [facility].   Patient denies hypoglycemic s/sx including dizziness, shakiness, sweating. Patient denies hyperglycemic symptoms including polyuria, polydipsia, polyphagia, nocturia, neuropathy, blurred vision.  Current medication access support:   Macrovascular and Microvascular Risk Reduction:  Statin? yes (however appears nonadherent, possibly due to pharmacy issues.); ACEi/ARB? yes (filled twice this year, BP has been borderline elevated.) Last urinary albumin/creatinine ratio:  Lab Results  Component Value Date   MICRALBCREAT 23 08/03/2023   MICRALBCREAT 67 (H) 07/22/2022   MICRALBCREAT 33 (H) 03/04/2022   Last eye exam:  Lab Results  Component Value Date   HMDIABEYEEXA Retinopathy (A) 03/11/2023   Last foot exam: 05/03/2023 Tobacco Use:  Tobacco Use: Low Risk  (02/02/2024)   Patient History    Smoking Tobacco Use: Never    Smokeless Tobacco Use: Never    Passive Exposure: Not on file     Objective:  Lab Results  Component Value Date   HGBA1C 9.2 (A) 02/02/2024   HGBA1C 9.2 02/02/2024    Lab Results  Component Value Date   CREATININE 0.68 (L) 07/29/2023   BUN 16 07/29/2023  NA 139 07/29/2023   K 4.2 07/29/2023   CL 98 07/29/2023   CO2 23 07/29/2023    Lab Results  Component Value Date   CHOL 130 07/29/2023   HDL 49 07/29/2023   LDLCALC 57 07/29/2023   LDLDIRECT 51.0 08/28/2015   TRIG 136 07/29/2023   CHOLHDL 2.7 07/29/2023    Medications Reviewed Today     Reviewed by Pandora Cadet, [facility] (Pharmacist) on 02/14/24 at 1349  Med List Status:  <None>   Medication Order Taking? Sig Documenting Provider Last Dose Status Informant    Discontinued 02/14/24 1346 (No longer needed (for PRN medications))   amLODipine  (NORVASC ) 10 MG tablet [id number]  TAKE 1 TABLET(10 MG) BY MOUTH DAILY de [place], Raymond J, MD  Active   atorvastatin  (LIPITOR) 10 MG tablet [id number]  Take 1 tablet (10 mg total) by mouth daily. de [place], Raymond J, MD  Active   Canagliflozin -metFORMIN  HCl ER (INVOKAMET  XR) [phone] MG TB24 [id number]  Take 2 tablets by mouth daily. de [place], Raymond J, MD  Active     Discontinued 02/14/24 1346 (Change in therapy)   Continuous Glucose Sensor (FREESTYLE LIBRE 3 PLUS SENSOR) MISC [id number] Yes Change sensor every 15 days. Knute Thersia Bitters, [place]  Active     Discontinued 02/14/24 1346 (No longer needed (for PRN medications)) glucose blood (ONE TOUCH ULTRA TEST) test strip [id number]  Use as instructed de [place], Raymond J, MD  Active     Discontinued 02/14/24 1348 (Patient Preference)            Med Note>> Joseph, Regeena, CPhT   10/21/2020  6:43 PM not taking never filled as of 02/14/2024      Discontinued 02/14/24 1348 (Duplicate)   insulin  glargine, 1 Unit Dial , (TOUJEO ) 300 UNIT/ML Solostar Pen [id number] Yes Inject 10 Units into the skin daily. de [place], Raymond J, MD  Active   Lancets [facility] ULTRASOFT) lancets [id number]  Use as instructed Kip Ade, NP  Active   lisinopril  (ZESTRIL ) 40 MG tablet [id number] Yes Take 1 tablet (40 mg total) by mouth daily. de [place], Raymond J, MD  Active               Assessment/Plan:   Diabetes: - Currently uncontrolled; goal A1c <7%. Cardiorenal risk reduction is opportunities for improvement.. Blood pressure is not at goal <130/80. LDL is at goal.  - Reviewed long term cardiovascular and renal outcomes of uncontrolled blood sugar. and Reviewed goal A1c, goal fasting, and goal 2 hour post prandial glucose. Recommended to check glucose continuously - contact pharmacist should there  be any issues with obtaining medications or how to use them - Recommend to continue toujeo  10 units daily, invokamet  150-1000mg  two tabs daily.   Follow Up Plan: 1 week telephone call, per dtr, [name], would like all [facility]. Previous confusion with some meds being sent to [facility] versus [facility].     Cadet Pandora, PharmD, BCGP Clinical Pharmacist  [phone]

## 2024-02-17 ENCOUNTER — Telehealth: Payer: Self-pay

## 2024-02-17 NOTE — Patient Instructions (Signed)
 Edward [name] - I am sorry I was unable to reach you today for our scheduled appointment. I work with de [place], Quintin PARAS, MD and am calling to support your healthcare needs. Please contact me at [phone] at your earliest convenience. I look forward to speaking with you soon.   Thank you,  [name], RN MSN [place]  [facility] RN Care Manager Direct Dial : [phone]  Fax: [phone]

## 2024-02-21 ENCOUNTER — Other Ambulatory Visit: Payer: Self-pay

## 2024-02-21 ENCOUNTER — Telehealth: Payer: Self-pay

## 2024-02-21 NOTE — Progress Notes (Signed)
 Complex Care Management Note Care Guide Note  02/21/2024 Name: LODEN LAURENT MRN: [id number] DOB: [birth year]   Complex Care Management Outreach Attempts: An unsuccessful telephone outreach was attempted today to offer the patient information about available complex care management services.  Follow Up Plan:  Additional outreach attempts will be made to offer the patient complex care management information and services.   Encounter Outcome:  No Answer  Dreama [facility]  [facility], [facility] VBCI Assistant Direct Dial : [phone]  Fax: [phone]

## 2024-02-21 NOTE — Progress Notes (Signed)
   02/21/2024 Name: Edward [name] MRN: [id number] DOB: [birth year]  No chief complaint on file.  Focused DM fu: - as off 02/20/2024 restarted DM meds, (glargine 10 units daily, invokamet  150-1000mg  twice daily with food)  - no updates on finger sticks, but dtr [name] notes he does this routinely. Plans to see him tonight to get CGM set up for him and she will follow along.  - if patient experiences any episodes of low blood sugar, recommend contact office or my direct line so we can help with any adjustments.   -Follow-up plan: d/t meds just being picked up and CGM not in place, agreed on 1 week f/u telephone call. Encouraged dtr to reach out with any questions before then. Hoping that I can also get set up with their CGM to follow along.   Future Appointments  Date Time Provider Department Center  02/28/2024  1:30 PM Pandora Cadet, [facility] None  03/19/2024 10:50 AM de [place], Quintin PARAS, MD [facility] [phone] Drawbr     Cadet Pandora, PharmD, BCGP Clinical Pharmacist  [phone]

## 2024-02-28 ENCOUNTER — Telehealth: Payer: Self-pay

## 2024-02-28 ENCOUNTER — Other Ambulatory Visit: Payer: Self-pay

## 2024-02-28 NOTE — Progress Notes (Signed)
   02/28/2024  Patient ID: Edward [name], male   DOB: 1956/01/11, 68 y.o.   MRN: [id number]  Attempted to contact patient for scheduled appointment for medication management. Left HIPAA compliant message for patient to return my call at their convenience.   Should patient return call, I can be reached at direct line below.   Lang Sieve, PharmD, BCGP Clinical Pharmacist  [phone]

## 2024-02-28 NOTE — Progress Notes (Unsigned)
   02/28/2024 Name: Edward [name] MRN: [id number] DOB: [birth year]  Chief Complaint  Patient presents with   Medication Management   Diabetes   Focused DM fu: - as off 02/20/2024 restarted DM meds, (glargine 10 units daily, invokamet  150-1000mg  twice daily with food)  - no updates on finger sticks, but dtr [name] notes he does this routinely. Plans to see him tonight to get CGM set up for him and she will follow along.  - if patient experiences any episodes of low blood sugar, recommend contact office or my direct line so we can help with any adjustments.   -Follow-up plan: d/t meds just being picked up and CGM not in place, agreed on 1 week f/u telephone call. Encouraged dtr to reach out with any questions before then. Hoping that I can also get set up with their CGM to follow along.  ______________________   Focused DM fu: - as off 02/20/2024 restarted DM meds, (glargine 10 units daily, invokamet  150-1000mg  twice daily with food)  - no updates on finger sticks, but dtr [name] notes he does this routinely. Plans to see him tonight to get CGM set up for him and she will follow along.  - if patient experiences any episodes of low blood sugar, recommend contact office or my direct line so we can help with any adjustments.   -Follow-up plan: d/t meds just being picked up and CGM not in place, agreed on 1 week f/u telephone call. Encouraged dtr to reach out with any questions before then. Hoping that I can also get set up with their CGM to follow along.   Future Appointments  Date Time Provider Department Center  03/19/2024 10:50 AM de [place], Raymond J, MD [facility] [phone] Drawbr     Lang Sieve, PharmD, BCGP Clinical Pharmacist  [phone]

## 2024-03-05 NOTE — Progress Notes (Signed)
 Complex Care Management Note  Care Guide Note 03/05/2024 Name: Edward [name] MRN: [id number] DOB: 1955/07/14  Edward [name] is a 68 y.o. year old male who sees de [place], Quintin PARAS, MD for primary care. I reached out to Edward [name] by phone today to offer complex care management services.  Edward [name] was given information about Complex Care Management services today including:   The Complex Care Management services include support from the care team which includes your Nurse Care Manager, Clinical Social Worker, or Pharmacist.  The Complex Care Management team is here to help remove barriers to the health concerns and goals most important to you. Complex Care Management services are voluntary, and the patient may decline or stop services at any time by request to their care team member.   Complex Care Management Consent Status: Patient did not agree to participate in complex care management services at this time.  Follow up plan:  Daughter Edward [name] plans to follow up with Edward [name], [facility].  Encounter Outcome:  Patient Scheduled  Dreama [facility]  [facility], [facility] VBCI Assistant Direct Dial : [phone]  Fax: [phone]

## 2024-03-19 ENCOUNTER — Ambulatory Visit (HOSPITAL_BASED_OUTPATIENT_CLINIC_OR_DEPARTMENT_OTHER): Admitting: Family Medicine

## 2024-03-19 DIAGNOSIS — E119 Type 2 diabetes mellitus without complications: Secondary | ICD-10-CM | POA: Diagnosis not present

## 2024-03-19 DIAGNOSIS — H5203 Hypermetropia, bilateral: Secondary | ICD-10-CM | POA: Diagnosis not present

## 2024-03-19 DIAGNOSIS — H35371 Puckering of macula, right eye: Secondary | ICD-10-CM | POA: Diagnosis not present

## 2024-03-19 DIAGNOSIS — H2513 Age-related nuclear cataract, bilateral: Secondary | ICD-10-CM | POA: Diagnosis not present

## 2024-04-04 ENCOUNTER — Other Ambulatory Visit (HOSPITAL_COMMUNITY): Payer: Self-pay

## 2024-04-04 NOTE — Progress Notes (Addendum)
 Edward [name]                                          MRN: [id number]   04/04/2024   The VBCI Quality Team Specialist reviewed this patient medical record for the purposes of chart review for care gap closure. The following were reviewed: chart review for care gap closure-controlling blood pressure and glycemic status assessment.    VBCI Quality Team

## 2024-05-02 ENCOUNTER — Ambulatory Visit (HOSPITAL_BASED_OUTPATIENT_CLINIC_OR_DEPARTMENT_OTHER): Admitting: Family Medicine

## 2024-05-07 ENCOUNTER — Other Ambulatory Visit (HOSPITAL_COMMUNITY): Payer: Self-pay

## 2024-05-08 ENCOUNTER — Ambulatory Visit (HOSPITAL_BASED_OUTPATIENT_CLINIC_OR_DEPARTMENT_OTHER): Admitting: Family Medicine

## 2024-05-15 ENCOUNTER — Ambulatory Visit (HOSPITAL_BASED_OUTPATIENT_CLINIC_OR_DEPARTMENT_OTHER): Payer: Medicare (Managed Care) | Admitting: Family Medicine

## 2024-05-15 ENCOUNTER — Encounter (HOSPITAL_BASED_OUTPATIENT_CLINIC_OR_DEPARTMENT_OTHER): Payer: Self-pay | Admitting: Family Medicine

## 2024-05-15 VITALS — BP 131/68 | HR 81 | Temp 97.6°F | Resp 18 | Ht 61.0 in | Wt 129.0 lb

## 2024-05-15 DIAGNOSIS — G5603 Carpal tunnel syndrome, bilateral upper limbs: Secondary | ICD-10-CM

## 2024-05-15 DIAGNOSIS — G5600 Carpal tunnel syndrome, unspecified upper limb: Secondary | ICD-10-CM | POA: Insufficient documentation

## 2024-05-15 DIAGNOSIS — I10 Essential (primary) hypertension: Secondary | ICD-10-CM

## 2024-05-15 DIAGNOSIS — E1165 Type 2 diabetes mellitus with hyperglycemia: Secondary | ICD-10-CM

## 2024-05-15 LAB — POCT GLYCOSYLATED HEMOGLOBIN (HGB A1C): Hemoglobin A1C: 8.4 % — AB (ref 4.0–5.6)

## 2024-05-15 MED ORDER — LISINOPRIL 40 MG PO TABS: 40.0000 mg | ORAL_TABLET | Freq: Every day | ORAL | 1 refills | Status: AC

## 2024-05-15 MED ORDER — AMLODIPINE BESYLATE 10 MG PO TABS: ORAL_TABLET | ORAL | 1 refills | Status: AC

## 2024-05-15 MED ORDER — TOUJEO MAX SOLOSTAR 300 UNIT/ML ~~LOC~~ SOPN: 10.0000 [IU] | PEN_INJECTOR | Freq: Every day | SUBCUTANEOUS | 0 refills | Status: AC

## 2024-05-15 MED ORDER — ATORVASTATIN CALCIUM 10 MG PO TABS: 10.0000 mg | ORAL_TABLET | Freq: Every day | ORAL | 3 refills | Status: AC

## 2024-05-15 MED ORDER — FREESTYLE LIBRE 3 PLUS SENSOR MISC: 3 refills | Status: AC

## 2024-05-15 NOTE — Progress Notes (Unsigned)
 "   Procedures performed today:    None.  Independent interpretation of notes and tests performed by another provider:   None.  Brief History, Exam, Impression, and Recommendations:    BP 131/68 (BP Location: Left Arm, Patient Position: Sitting, Cuff Size: Normal)   Pulse 81   Temp 97.6 F (36.4 C) (Oral)   Resp 18   Ht 5' 1 (1.549 m)   Wt 129 lb (58.5 kg)   SpO2 100%   BMI 24.37 kg/m   In-person interpreter utilized for today's appointment.   Discussed the use of AI scribe software for clinical note transcription with the patient, who gave verbal consent to proceed.  History of Present Illness Edward [name] is a 69 year old male with diabetes who presents with tingling and numbness in his fingers.  He experiences tingling and numbness in his fingers upon waking up, affecting both hands equally and involving the whole hand rather than specific fingers. These symptoms do not wake him during the night and are absent during the day. No similar issues in the past and he has not tried any specific treatments for this issue yet.  He has a history of diabetes and monitors his blood sugar levels at home using a glucometer. His blood sugar readings are approximately 115 mg/dL fasting and rise to around 300 mg/dL after meals. He is currently on insulin  therapy, administering 10 units, and reports no issues with this treatment. He no longer has the patch device for continuous glucose monitoring and relies on fingerstick testing.  He reports a little tingling when performing certain hand movements, similar to the symptoms experienced upon waking.  On exam, patient in no acute distress. VSS.   Assessment and Plan Assessment & Plan Bilateral carpal tunnel syndrome Intermittent tingling and numbness in both hands upon waking, suggestive of bilateral carpal tunnel syndrome. Symptoms resolve quickly.  Type 2 diabetes mellitus with hyperglycemia Elevated postprandial blood glucose levels  around 300 mg/dL, fasting levels around 115 mg/dL. On insulin  therapy. - Checked A1c today using finger poke method. - Continue current insulin  regimen. - Ensure availability of glucometer for home blood glucose monitoring.  Essential hypertension Blood pressure well-controlled with current medication regimen. - Continue current antihypertensive medications.    Bilateral carpal tunnel syndrome  Essential hypertension -     amLODIPine  Besylate; TAKE 1 TABLET(10 MG) BY MOUTH DAILY in the morning for blood pressure.**vietnamese translation needed on dispensed prescription**  Dispense: 90 tablet; Refill: 1 -     Lisinopril ; Take 1 tablet (40 mg total) by mouth daily. For high blood pressure. **vietnamese translation needed on dispensed prescription**  Dispense: 90 tablet; Refill: 1  Type 2 diabetes mellitus with hyperglycemia, with long-term current use of insulin  (HCC) -     Atorvastatin  Calcium ; Take 1 tablet (10 mg total) by mouth daily. For high cholesterol, heart protection. **vietnamese translation needed on dispensed prescription**  Dispense: 90 tablet; Refill: 3 -     FreeStyle Libre 3 Plus Sensor; Change sensor every 15 days. **vietnamese translation needed on dispensed prescription**  Dispense: 6 each; Refill: 3 -     POCT glycosylated hemoglobin (Hb A1C)  Other orders -     Toujeo  Max SoloStar; Inject 10 Units into the skin daily. For diabetes/high blood sugar. Call office of Dr everitt Cuba if experiencing blood sugar less than 70. **vietnamese translation needed on dispensed prescription**  Dispense: 3 mL; Refill: 0  Return in about 3 months (around 08/13/2024) for diabetes, hypertension, 40 minutes.  ___________________________________________ Wissam Resor de [place], MD, ABFM, CAQSM Primary Care and Sports Medicine [facility] "

## 2024-05-18 ENCOUNTER — Telehealth (HOSPITAL_BASED_OUTPATIENT_CLINIC_OR_DEPARTMENT_OTHER): Payer: Self-pay | Admitting: Pharmacy Technician

## 2024-05-18 ENCOUNTER — Other Ambulatory Visit (HOSPITAL_COMMUNITY): Payer: Self-pay

## 2024-05-18 NOTE — Telephone Encounter (Signed)
 Pharmacy Patient Advocate Encounter   Received notification from [facility] that prior authorization for Invokamet  XR 150-1000MG  er tablets is required/requested.   Insurance verification completed.   The patient is insured through [employer].   Per test claim: PA required; PA submitted to above mentioned insurance via Latent Key/confirmation #/EOC [id number] Status is pending

## 2024-05-21 ENCOUNTER — Other Ambulatory Visit (HOSPITAL_COMMUNITY): Payer: Self-pay

## 2024-05-21 NOTE — Telephone Encounter (Signed)
 Pharmacy Patient Advocate Encounter  Received notification from [employer] [facility] that Prior Authorization for Invokamet  XR 150-1000MG  er tablets has been APPROVED from 05/03/2024 to 05/18/2025. Ran test claim, Copay is $12.65. This test claim was processed through [facility]- copay amounts may vary at other pharmacies due to pharmacy/plan contracts, or as the patient moves through the different stages of their insurance plan.   PA #/Case ID/Reference #: [id number]

## 2024-09-12 ENCOUNTER — Ambulatory Visit (HOSPITAL_BASED_OUTPATIENT_CLINIC_OR_DEPARTMENT_OTHER): Payer: Medicare (Managed Care) | Admitting: Family Medicine
